# Patient Record
Sex: Female | Born: 1955
Health system: Southern US, Community
[De-identification: ages and names within clinical notes are randomized; demographics above are authoritative.]

## PROBLEM LIST (undated history)

## (undated) DIAGNOSIS — R519 Headache, unspecified: Secondary | ICD-10-CM

## (undated) DIAGNOSIS — R51 Headache: Secondary | ICD-10-CM

## (undated) DIAGNOSIS — K219 Gastro-esophageal reflux disease without esophagitis: Secondary | ICD-10-CM

## (undated) DIAGNOSIS — I10 Essential (primary) hypertension: Secondary | ICD-10-CM

## (undated) DIAGNOSIS — M1990 Unspecified osteoarthritis, unspecified site: Secondary | ICD-10-CM

## (undated) DIAGNOSIS — E039 Hypothyroidism, unspecified: Secondary | ICD-10-CM

## (undated) DIAGNOSIS — C189 Malignant neoplasm of colon, unspecified: Secondary | ICD-10-CM

## (undated) DIAGNOSIS — Z9221 Personal history of antineoplastic chemotherapy: Secondary | ICD-10-CM

## (undated) DIAGNOSIS — Z972 Presence of dental prosthetic device (complete) (partial): Secondary | ICD-10-CM

## (undated) DIAGNOSIS — C50919 Malignant neoplasm of unspecified site of unspecified female breast: Secondary | ICD-10-CM

## (undated) DIAGNOSIS — I82409 Acute embolism and thrombosis of unspecified deep veins of unspecified lower extremity: Secondary | ICD-10-CM

## (undated) DIAGNOSIS — C50912 Malignant neoplasm of unspecified site of left female breast: Secondary | ICD-10-CM

## (undated) HISTORY — PX: OTHER SURGICAL HISTORY: SHX169

## (undated) HISTORY — PX: ABDOMINAL HYSTERECTOMY: SHX81

---

## 2008-12-22 DIAGNOSIS — C50912 Malignant neoplasm of unspecified site of left female breast: Secondary | ICD-10-CM

## 2008-12-22 DIAGNOSIS — Z923 Personal history of irradiation: Secondary | ICD-10-CM

## 2008-12-22 HISTORY — PX: BREAST BIOPSY: SHX20

## 2008-12-22 HISTORY — DX: Personal history of irradiation: Z92.3

## 2008-12-22 HISTORY — DX: Malignant neoplasm of unspecified site of left female breast: C50.912

## 2008-12-22 HISTORY — PX: BREAST SURGERY: SHX581

## 2009-10-22 DIAGNOSIS — C50919 Malignant neoplasm of unspecified site of unspecified female breast: Secondary | ICD-10-CM

## 2009-10-22 HISTORY — DX: Malignant neoplasm of unspecified site of unspecified female breast: C50.919

## 2009-10-24 ENCOUNTER — Ambulatory Visit: Payer: Self-pay | Admitting: Family Medicine

## 2009-11-12 ENCOUNTER — Ambulatory Visit: Payer: Self-pay | Admitting: Surgery

## 2009-11-21 ENCOUNTER — Ambulatory Visit: Payer: Self-pay | Admitting: Oncology

## 2009-11-21 ENCOUNTER — Ambulatory Visit: Payer: Self-pay | Admitting: Surgery

## 2009-11-23 ENCOUNTER — Ambulatory Visit: Payer: Self-pay | Admitting: Surgery

## 2009-12-10 ENCOUNTER — Ambulatory Visit: Payer: Self-pay | Admitting: Oncology

## 2009-12-22 ENCOUNTER — Ambulatory Visit: Payer: Self-pay | Admitting: Oncology

## 2009-12-24 ENCOUNTER — Ambulatory Visit: Payer: Self-pay | Admitting: Oncology

## 2010-01-22 ENCOUNTER — Ambulatory Visit: Payer: Self-pay | Admitting: Oncology

## 2010-02-19 ENCOUNTER — Ambulatory Visit: Payer: Self-pay | Admitting: Oncology

## 2010-03-22 ENCOUNTER — Ambulatory Visit: Payer: Self-pay | Admitting: Oncology

## 2010-04-21 ENCOUNTER — Ambulatory Visit: Payer: Self-pay | Admitting: Oncology

## 2010-05-22 ENCOUNTER — Ambulatory Visit: Payer: Self-pay | Admitting: Oncology

## 2010-05-23 ENCOUNTER — Ambulatory Visit: Payer: Self-pay | Admitting: Oncology

## 2010-06-21 ENCOUNTER — Ambulatory Visit: Payer: Self-pay | Admitting: Oncology

## 2010-06-28 ENCOUNTER — Emergency Department: Payer: Self-pay | Admitting: Emergency Medicine

## 2010-08-22 ENCOUNTER — Ambulatory Visit: Payer: Self-pay | Admitting: Internal Medicine

## 2010-08-27 ENCOUNTER — Ambulatory Visit: Payer: Self-pay | Admitting: Oncology

## 2010-09-21 ENCOUNTER — Ambulatory Visit: Payer: Self-pay | Admitting: Internal Medicine

## 2010-11-12 ENCOUNTER — Ambulatory Visit: Payer: Self-pay | Admitting: Oncology

## 2011-03-10 ENCOUNTER — Ambulatory Visit: Payer: Self-pay | Admitting: Oncology

## 2011-03-23 ENCOUNTER — Ambulatory Visit: Payer: Self-pay | Admitting: Oncology

## 2011-09-10 ENCOUNTER — Ambulatory Visit: Payer: Self-pay | Admitting: Oncology

## 2011-09-22 ENCOUNTER — Ambulatory Visit: Payer: Self-pay | Admitting: Oncology

## 2011-10-23 ENCOUNTER — Ambulatory Visit: Payer: Self-pay | Admitting: Oncology

## 2011-12-23 DIAGNOSIS — C189 Malignant neoplasm of colon, unspecified: Secondary | ICD-10-CM

## 2011-12-23 HISTORY — DX: Malignant neoplasm of colon, unspecified: C18.9

## 2012-09-26 ENCOUNTER — Inpatient Hospital Stay: Payer: Self-pay | Admitting: Surgery

## 2012-09-26 LAB — CBC
HCT: 34.1 % — ABNORMAL LOW (ref 35.0–47.0)
HGB: 11.8 g/dL — ABNORMAL LOW (ref 12.0–16.0)
MCH: 30.9 pg (ref 26.0–34.0)
MCHC: 34.7 g/dL (ref 32.0–36.0)
Platelet: 227 10*3/uL (ref 150–440)
RBC: 3.83 10*6/uL (ref 3.80–5.20)

## 2012-09-26 LAB — COMPREHENSIVE METABOLIC PANEL
Albumin: 2.9 g/dL — ABNORMAL LOW (ref 3.4–5.0)
Alkaline Phosphatase: 71 U/L (ref 50–136)
BUN: 11 mg/dL (ref 7–18)
Bilirubin,Total: 0.3 mg/dL (ref 0.2–1.0)
Co2: 28 mmol/L (ref 21–32)
Creatinine: 0.51 mg/dL — ABNORMAL LOW (ref 0.60–1.30)
EGFR (Non-African Amer.): >60
Glucose: 111 mg/dL — ABNORMAL HIGH (ref 65–99)
Osmolality: 283 (ref 275–301)
SGPT (ALT): 11 U/L — ABNORMAL LOW (ref 12–78)
Sodium: 142 mmol/L (ref 136–145)
Total Protein: 6.9 g/dL (ref 6.4–8.2)

## 2012-09-26 LAB — URINALYSIS, COMPLETE
Glucose,UR: NEGATIVE mg/dL (ref 0–75)
Ketone: NEGATIVE
Leukocyte Esterase: NEGATIVE
Nitrite: NEGATIVE
RBC,UR: 28 /HPF (ref 0–5)
Specific Gravity: 1.015 (ref 1.003–1.030)
WBC UR: 4 /HPF (ref 0–5)

## 2012-09-27 LAB — CBC WITH DIFFERENTIAL/PLATELET
Basophil %: 0.6 %
Eosinophil %: 1.2 %
HCT: 29.6 % — ABNORMAL LOW (ref 35.0–47.0)
HGB: 10.3 g/dL — ABNORMAL LOW (ref 12.0–16.0)
Lymphocyte #: 0.9 10*3/uL — ABNORMAL LOW (ref 1.0–3.6)
MCH: 31 pg (ref 26.0–34.0)
MCHC: 34.7 g/dL (ref 32.0–36.0)
MCV: 89 fL (ref 80–100)
Monocyte #: 0.5 x10 3/mm (ref 0.2–0.9)
Neutrophil #: 3.5 10*3/uL (ref 1.4–6.5)
Neutrophil %: 70.4 %
RBC: 3.31 10*6/uL — ABNORMAL LOW (ref 3.80–5.20)

## 2012-09-27 LAB — COMPREHENSIVE METABOLIC PANEL
BUN: 6 mg/dL — ABNORMAL LOW (ref 7–18)
Bilirubin,Total: 0.4 mg/dL (ref 0.2–1.0)
Chloride: 108 mmol/L — ABNORMAL HIGH (ref 98–107)
Creatinine: 0.39 mg/dL — ABNORMAL LOW (ref 0.60–1.30)
EGFR (African American): >60
Glucose: 90 mg/dL (ref 65–99)
Osmolality: 286 (ref 275–301)
Potassium: 3.8 mmol/L (ref 3.5–5.1)
SGPT (ALT): 10 U/L — ABNORMAL LOW (ref 12–78)
Total Protein: 5.6 g/dL — ABNORMAL LOW (ref 6.4–8.2)

## 2012-09-27 LAB — TSH: Thyroid Stimulating Horm: <0.01 u[IU]/mL — ABNORMAL LOW

## 2012-09-28 LAB — CBC WITH DIFFERENTIAL/PLATELET
Basophil #: 0.1 10*3/uL (ref 0.0–0.1)
HCT: 28.3 % — ABNORMAL LOW (ref 35.0–47.0)
HGB: 9.8 g/dL — ABNORMAL LOW (ref 12.0–16.0)
Lymphocyte #: 1.2 10*3/uL (ref 1.0–3.6)
Lymphocyte %: 21.3 %
MCH: 30.7 pg (ref 26.0–34.0)
MCV: 89 fL (ref 80–100)
Monocyte #: 0.5 x10 3/mm (ref 0.2–0.9)
Monocyte %: 9.2 %
Neutrophil #: 3.9 10*3/uL (ref 1.4–6.5)
RBC: 3.18 10*6/uL — ABNORMAL LOW (ref 3.80–5.20)
RDW: 12 % (ref 11.5–14.5)
WBC: 5.7 10*3/uL (ref 3.6–11.0)

## 2012-09-28 LAB — URINE CULTURE

## 2012-10-03 LAB — CULTURE, BLOOD (SINGLE)

## 2012-11-21 HISTORY — PX: COLON SURGERY: SHX602

## 2012-11-21 HISTORY — PX: APPENDECTOMY: SHX54

## 2012-12-01 ENCOUNTER — Ambulatory Visit: Payer: Self-pay | Admitting: Surgery

## 2012-12-01 LAB — CBC WITH DIFFERENTIAL/PLATELET
Basophil #: 0 10*3/uL (ref 0.0–0.1)
Eosinophil #: 0.1 10*3/uL (ref 0.0–0.7)
Eosinophil %: 1.8 %
Lymphocyte #: 1.7 10*3/uL (ref 1.0–3.6)
Lymphocyte %: 37.2 %
MCHC: 33.8 g/dL (ref 32.0–36.0)
MCV: 89 fL (ref 80–100)
Monocyte #: 0.3 x10 3/mm (ref 0.2–0.9)
Monocyte %: 6.2 %
Neutrophil %: 54 %
Platelet: 209 10*3/uL (ref 150–440)
RBC: 3.84 10*6/uL (ref 3.80–5.20)
RDW: 15.3 % — ABNORMAL HIGH (ref 11.5–14.5)
WBC: 4.4 10*3/uL (ref 3.6–11.0)

## 2012-12-01 LAB — HEPATIC FUNCTION PANEL A (ARMC)
Albumin: 3.7 g/dL (ref 3.4–5.0)
Bilirubin, Direct: <0.1 mg/dL (ref 0.00–0.20)
Bilirubin,Total: 0.3 mg/dL (ref 0.2–1.0)
SGPT (ALT): 16 U/L (ref 12–78)
Total Protein: 6.7 g/dL (ref 6.4–8.2)

## 2012-12-01 LAB — BASIC METABOLIC PANEL
Anion Gap: 5 — ABNORMAL LOW (ref 7–16)
Calcium, Total: 8.6 mg/dL (ref 8.5–10.1)
Co2: 29 mmol/L (ref 21–32)
Creatinine: 0.37 mg/dL — ABNORMAL LOW (ref 0.60–1.30)
EGFR (Non-African Amer.): >60
Potassium: 3.8 mmol/L (ref 3.5–5.1)
Sodium: 142 mmol/L (ref 136–145)

## 2012-12-08 ENCOUNTER — Inpatient Hospital Stay: Payer: Self-pay | Admitting: Surgery

## 2012-12-09 LAB — BASIC METABOLIC PANEL
Anion Gap: 5 — ABNORMAL LOW (ref 7–16)
BUN: 7 mg/dL (ref 7–18)
Calcium, Total: 8.8 mg/dL (ref 8.5–10.1)
Co2: 28 mmol/L (ref 21–32)
EGFR (African American): >60
EGFR (Non-African Amer.): >60
Glucose: 146 mg/dL — ABNORMAL HIGH (ref 65–99)
Osmolality: 276 (ref 275–301)
Potassium: 3.5 mmol/L (ref 3.5–5.1)

## 2012-12-09 LAB — CBC WITH DIFFERENTIAL/PLATELET
Basophil %: 0.4 %
Eosinophil #: 0 10*3/uL (ref 0.0–0.7)
HCT: 36.9 % (ref 35.0–47.0)
MCH: 29.1 pg (ref 26.0–34.0)
MCHC: 32.4 g/dL (ref 32.0–36.0)
MCV: 90 fL (ref 80–100)
Monocyte %: 6 %
Neutrophil #: 6.8 10*3/uL — ABNORMAL HIGH (ref 1.4–6.5)
Platelet: 209 10*3/uL (ref 150–440)
RDW: 15.5 % — ABNORMAL HIGH (ref 11.5–14.5)

## 2012-12-11 LAB — CBC WITH DIFFERENTIAL/PLATELET
Basophil #: 0 10*3/uL (ref 0.0–0.1)
Basophil %: 0.8 %
Eosinophil %: 2.2 %
HGB: 11.2 g/dL — ABNORMAL LOW (ref 12.0–16.0)
Lymphocyte #: 1.2 10*3/uL (ref 1.0–3.6)
Lymphocyte %: 30.6 %
MCH: 30 pg (ref 26.0–34.0)
MCHC: 34 g/dL (ref 32.0–36.0)
MCV: 88 fL (ref 80–100)
Monocyte #: 0.4 x10 3/mm (ref 0.2–0.9)
Neutrophil %: 56.6 %
RBC: 3.74 10*6/uL — ABNORMAL LOW (ref 3.80–5.20)
RDW: 14.9 % — ABNORMAL HIGH (ref 11.5–14.5)
WBC: 3.8 10*3/uL (ref 3.6–11.0)

## 2012-12-11 LAB — BASIC METABOLIC PANEL
Anion Gap: 6 — ABNORMAL LOW (ref 7–16)
Calcium, Total: 8.5 mg/dL (ref 8.5–10.1)
Chloride: 105 mmol/L (ref 98–107)
Co2: 28 mmol/L (ref 21–32)
EGFR (African American): >60
Sodium: 139 mmol/L (ref 136–145)

## 2012-12-13 LAB — CBC WITH DIFFERENTIAL/PLATELET
Basophil #: 0 10*3/uL (ref 0.0–0.1)
HCT: 33.8 % — ABNORMAL LOW (ref 35.0–47.0)
HGB: 11.6 g/dL — ABNORMAL LOW (ref 12.0–16.0)
Lymphocyte %: 18.3 %
MCH: 30 pg (ref 26.0–34.0)
MCV: 88 fL (ref 80–100)
Monocyte %: 8.7 %
Neutrophil #: 3.4 10*3/uL (ref 1.4–6.5)
RDW: 14.8 % — ABNORMAL HIGH (ref 11.5–14.5)
WBC: 4.7 10*3/uL (ref 3.6–11.0)

## 2012-12-13 LAB — COMPREHENSIVE METABOLIC PANEL
Albumin: 2.1 g/dL — ABNORMAL LOW (ref 3.4–5.0)
Alkaline Phosphatase: 59 U/L (ref 50–136)
Calcium, Total: 7.9 mg/dL — ABNORMAL LOW (ref 8.5–10.1)
Co2: 29 mmol/L (ref 21–32)
Creatinine: 0.55 mg/dL — ABNORMAL LOW (ref 0.60–1.30)
EGFR (African American): >60
EGFR (Non-African Amer.): >60
Glucose: 136 mg/dL — ABNORMAL HIGH (ref 65–99)
SGOT(AST): 9 U/L — ABNORMAL LOW (ref 15–37)
SGPT (ALT): 10 U/L — ABNORMAL LOW (ref 12–78)

## 2012-12-13 LAB — PATHOLOGY REPORT

## 2012-12-14 LAB — CEA: CEA: 7.8 ng/mL — ABNORMAL HIGH (ref 0.0–4.7)

## 2012-12-15 LAB — COMPREHENSIVE METABOLIC PANEL
Albumin: 2 g/dL — ABNORMAL LOW (ref 3.4–5.0)
Anion Gap: 6 — ABNORMAL LOW (ref 7–16)
BUN: 7 mg/dL (ref 7–18)
Bilirubin,Total: 0.3 mg/dL (ref 0.2–1.0)
Creatinine: 0.45 mg/dL — ABNORMAL LOW (ref 0.60–1.30)
EGFR (African American): >60
Glucose: 100 mg/dL — ABNORMAL HIGH (ref 65–99)
Total Protein: 5.4 g/dL — ABNORMAL LOW (ref 6.4–8.2)

## 2012-12-15 LAB — CBC WITH DIFFERENTIAL/PLATELET
Basophil #: 0 10*3/uL (ref 0.0–0.1)
Eosinophil #: 0 10*3/uL (ref 0.0–0.7)
HCT: 28.9 % — ABNORMAL LOW (ref 35.0–47.0)
HGB: 9.7 g/dL — ABNORMAL LOW (ref 12.0–16.0)
MCH: 29.6 pg (ref 26.0–34.0)
MCHC: 33.6 g/dL (ref 32.0–36.0)
Monocyte #: 0.5 x10 3/mm (ref 0.2–0.9)
Neutrophil %: 56 %
Platelet: 259 10*3/uL (ref 150–440)
RBC: 3.28 10*6/uL — ABNORMAL LOW (ref 3.80–5.20)
RDW: 14.6 % — ABNORMAL HIGH (ref 11.5–14.5)
WBC: 2.9 10*3/uL — ABNORMAL LOW (ref 3.6–11.0)

## 2012-12-17 LAB — COMPREHENSIVE METABOLIC PANEL
Albumin: 2.4 g/dL — ABNORMAL LOW (ref 3.4–5.0)
Alkaline Phosphatase: 76 U/L (ref 50–136)
Anion Gap: 8 (ref 7–16)
BUN: 9 mg/dL (ref 7–18)
Bilirubin,Total: 0.3 mg/dL (ref 0.2–1.0)
Chloride: 100 mmol/L (ref 98–107)
Creatinine: 0.47 mg/dL — ABNORMAL LOW (ref 0.60–1.30)
EGFR (African American): >60
Glucose: 91 mg/dL (ref 65–99)
Potassium: 3.7 mmol/L (ref 3.5–5.1)
SGOT(AST): 20 U/L (ref 15–37)
SGPT (ALT): 13 U/L (ref 12–78)
Total Protein: 6.3 g/dL — ABNORMAL LOW (ref 6.4–8.2)

## 2012-12-17 LAB — CBC WITH DIFFERENTIAL/PLATELET
Basophil #: 0 10*3/uL (ref 0.0–0.1)
Eosinophil #: 0.1 10*3/uL (ref 0.0–0.7)
HCT: 32.9 % — ABNORMAL LOW (ref 35.0–47.0)
HGB: 11.1 g/dL — ABNORMAL LOW (ref 12.0–16.0)
Lymphocyte #: 1.3 10*3/uL (ref 1.0–3.6)
MCH: 29.5 pg (ref 26.0–34.0)
MCHC: 33.6 g/dL (ref 32.0–36.0)
MCV: 88 fL (ref 80–100)
Monocyte %: 9.1 %
Neutrophil #: 4.6 10*3/uL (ref 1.4–6.5)
Neutrophil %: 69.1 %
Platelet: 418 10*3/uL (ref 150–440)
RBC: 3.75 10*6/uL — ABNORMAL LOW (ref 3.80–5.20)
RDW: 14.3 % (ref 11.5–14.5)
WBC: 6.6 10*3/uL (ref 3.6–11.0)

## 2012-12-22 ENCOUNTER — Ambulatory Visit: Payer: Self-pay | Admitting: Oncology

## 2012-12-31 ENCOUNTER — Ambulatory Visit: Payer: Self-pay | Admitting: Oncology

## 2013-01-05 ENCOUNTER — Ambulatory Visit: Payer: Self-pay | Admitting: Oncology

## 2013-01-14 ENCOUNTER — Other Ambulatory Visit: Payer: Self-pay | Admitting: Surgery

## 2013-01-14 LAB — CBC WITH DIFFERENTIAL/PLATELET
Basophil %: 1.3 %
HCT: 35.3 % (ref 35.0–47.0)
Lymphocyte #: 1.9 10*3/uL (ref 1.0–3.6)
Lymphocyte %: 30.4 %
MCH: 28.3 pg (ref 26.0–34.0)
Monocyte %: 5.1 %
Neutrophil #: 4 10*3/uL (ref 1.4–6.5)
RBC: 4.06 10*6/uL (ref 3.80–5.20)
RDW: 13.8 % (ref 11.5–14.5)
WBC: 6.4 10*3/uL (ref 3.6–11.0)

## 2013-01-14 LAB — BASIC METABOLIC PANEL
BUN: 11 mg/dL (ref 7–18)
Calcium, Total: 9 mg/dL (ref 8.5–10.1)
EGFR (African American): >60
Glucose: 83 mg/dL (ref 65–99)
Osmolality: 280 (ref 275–301)
Potassium: 3.9 mmol/L (ref 3.5–5.1)
Sodium: 141 mmol/L (ref 136–145)

## 2013-01-19 ENCOUNTER — Ambulatory Visit: Payer: Self-pay | Admitting: Surgery

## 2013-01-20 LAB — COMPREHENSIVE METABOLIC PANEL
Alkaline Phosphatase: 78 U/L (ref 50–136)
Anion Gap: 9 (ref 7–16)
BUN: 14 mg/dL (ref 7–18)
Bilirubin,Total: 0.3 mg/dL (ref 0.2–1.0)
Chloride: 101 mmol/L (ref 98–107)
Creatinine: 0.61 mg/dL (ref 0.60–1.30)
EGFR (African American): >60
EGFR (Non-African Amer.): >60
Osmolality: 280 (ref 275–301)
Potassium: 3.7 mmol/L (ref 3.5–5.1)
SGOT(AST): 15 U/L (ref 15–37)
Sodium: 140 mmol/L (ref 136–145)
Total Protein: 6.9 g/dL (ref 6.4–8.2)

## 2013-01-20 LAB — CBC CANCER CENTER
Basophil %: 0.9 %
Eosinophil #: 0.1 x10 3/mm (ref 0.0–0.7)
HCT: 34.9 % — ABNORMAL LOW (ref 35.0–47.0)
Lymphocyte #: 1.9 x10 3/mm (ref 1.0–3.6)
MCH: 29.1 pg (ref 26.0–34.0)
MCHC: 33.4 g/dL (ref 32.0–36.0)
MCV: 87 fL (ref 80–100)
Monocyte #: 0.3 x10 3/mm (ref 0.2–0.9)
Monocyte %: 6.5 %
Neutrophil %: 50.1 %
RBC: 4 10*6/uL (ref 3.80–5.20)
RDW: 14.8 % — ABNORMAL HIGH (ref 11.5–14.5)
WBC: 4.6 x10 3/mm (ref 3.6–11.0)

## 2013-01-21 LAB — CEA: CEA: 0.8 ng/mL (ref 0.0–4.7)

## 2013-01-22 ENCOUNTER — Ambulatory Visit: Payer: Self-pay | Admitting: Oncology

## 2013-01-27 LAB — CBC CANCER CENTER
Basophil %: 0.6 %
Eosinophil #: 0.2 x10 3/mm (ref 0.0–0.7)
Eosinophil %: 5.5 %
HCT: 37.1 % (ref 35.0–47.0)
HGB: 12.6 g/dL (ref 12.0–16.0)
Lymphocyte #: 1.9 x10 3/mm (ref 1.0–3.6)
Lymphocyte %: 43.7 %
MCH: 29.3 pg (ref 26.0–34.0)
MCHC: 33.9 g/dL (ref 32.0–36.0)
Monocyte %: 6.3 %
Neutrophil #: 1.9 x10 3/mm (ref 1.4–6.5)
Neutrophil %: 43.9 %
RBC: 4.3 10*6/uL (ref 3.80–5.20)
RDW: 13.7 % (ref 11.5–14.5)
WBC: 4.2 x10 3/mm (ref 3.6–11.0)

## 2013-01-27 LAB — COMPREHENSIVE METABOLIC PANEL
Alkaline Phosphatase: 75 U/L (ref 50–136)
Chloride: 100 mmol/L (ref 98–107)
Co2: 31 mmol/L (ref 21–32)
Creatinine: 0.66 mg/dL (ref 0.60–1.30)
EGFR (African American): >60
EGFR (Non-African Amer.): >60
Glucose: 143 mg/dL — ABNORMAL HIGH (ref 65–99)
Osmolality: 283 (ref 275–301)
SGPT (ALT): 31 U/L (ref 12–78)
Sodium: 141 mmol/L (ref 136–145)
Total Protein: 7.6 g/dL (ref 6.4–8.2)

## 2013-01-27 LAB — MAGNESIUM: Magnesium: 1.1 mg/dL — ABNORMAL LOW

## 2013-02-11 LAB — CBC CANCER CENTER
Basophil #: 0 x10 3/mm (ref 0.0–0.1)
Eosinophil #: 0.1 x10 3/mm (ref 0.0–0.7)
Eosinophil %: 2.6 %
HCT: 35 % (ref 35.0–47.0)
HGB: 11.8 g/dL — ABNORMAL LOW (ref 12.0–16.0)
MCH: 29.7 pg (ref 26.0–34.0)
MCHC: 33.8 g/dL (ref 32.0–36.0)
Monocyte #: 0.4 x10 3/mm (ref 0.2–0.9)
Neutrophil #: 1.5 x10 3/mm (ref 1.4–6.5)
Neutrophil %: 44.5 %
Platelet: 189 x10 3/mm (ref 150–440)
RBC: 3.99 10*6/uL (ref 3.80–5.20)
RDW: 16 % — ABNORMAL HIGH (ref 11.5–14.5)
WBC: 3.4 x10 3/mm — ABNORMAL LOW (ref 3.6–11.0)

## 2013-02-11 LAB — COMPREHENSIVE METABOLIC PANEL
Albumin: 3.4 g/dL (ref 3.4–5.0)
Alkaline Phosphatase: 82 U/L (ref 50–136)
BUN: 6 mg/dL — ABNORMAL LOW (ref 7–18)
Bilirubin,Total: 0.3 mg/dL (ref 0.2–1.0)
Calcium, Total: 9 mg/dL (ref 8.5–10.1)
Chloride: 105 mmol/L (ref 98–107)
Co2: 27 mmol/L (ref 21–32)
Creatinine: 0.7 mg/dL (ref 0.60–1.30)
EGFR (African American): >60
Potassium: 4 mmol/L (ref 3.5–5.1)
SGOT(AST): 19 U/L (ref 15–37)
Sodium: 141 mmol/L (ref 136–145)
Total Protein: 6.8 g/dL (ref 6.4–8.2)

## 2013-02-19 ENCOUNTER — Ambulatory Visit: Payer: Self-pay | Admitting: Oncology

## 2013-02-25 LAB — CBC CANCER CENTER
Basophil #: 0 x10 3/mm (ref 0.0–0.1)
Eosinophil %: 2.8 %
HCT: 34.3 % — ABNORMAL LOW (ref 35.0–47.0)
HGB: 11.4 g/dL — ABNORMAL LOW (ref 12.0–16.0)
Lymphocyte %: 41.9 %
MCHC: 33.1 g/dL (ref 32.0–36.0)
Monocyte #: 0.2 x10 3/mm (ref 0.2–0.9)
Monocyte %: 5.9 %
Neutrophil #: 1.5 x10 3/mm (ref 1.4–6.5)
Neutrophil %: 48.5 %
Platelet: 127 x10 3/mm — ABNORMAL LOW (ref 150–440)
RBC: 3.9 10*6/uL (ref 3.80–5.20)
RDW: 16.3 % — ABNORMAL HIGH (ref 11.5–14.5)
WBC: 3.1 x10 3/mm — ABNORMAL LOW (ref 3.6–11.0)

## 2013-02-25 LAB — COMPREHENSIVE METABOLIC PANEL
Albumin: 3.3 g/dL — ABNORMAL LOW (ref 3.4–5.0)
Bilirubin,Total: 0.2 mg/dL (ref 0.2–1.0)
Calcium, Total: 8.5 mg/dL (ref 8.5–10.1)
Co2: 31 mmol/L (ref 21–32)
Creatinine: 0.57 mg/dL — ABNORMAL LOW (ref 0.60–1.30)
EGFR (Non-African Amer.): >60
Glucose: 92 mg/dL (ref 65–99)
Potassium: 3.7 mmol/L (ref 3.5–5.1)
SGOT(AST): 26 U/L (ref 15–37)
SGPT (ALT): 28 U/L (ref 12–78)
Sodium: 144 mmol/L (ref 136–145)

## 2013-02-25 LAB — MAGNESIUM: Magnesium: 1.8 mg/dL

## 2013-03-11 LAB — CBC CANCER CENTER
Eosinophil %: 1.2 %
HGB: 11.7 g/dL — ABNORMAL LOW (ref 12.0–16.0)
Lymphocyte #: 1.3 x10 3/mm (ref 1.0–3.6)
Lymphocyte %: 31.8 %
MCH: 29.8 pg (ref 26.0–34.0)
MCHC: 34 g/dL (ref 32.0–36.0)
MCV: 88 fL (ref 80–100)
Monocyte #: 0.4 x10 3/mm (ref 0.2–0.9)
Neutrophil #: 2.3 x10 3/mm (ref 1.4–6.5)
Neutrophil %: 56.2 %
Platelet: 95 x10 3/mm — ABNORMAL LOW (ref 150–440)
RDW: 17.6 % — ABNORMAL HIGH (ref 11.5–14.5)
WBC: 4 x10 3/mm (ref 3.6–11.0)

## 2013-03-11 LAB — COMPREHENSIVE METABOLIC PANEL
Alkaline Phosphatase: 69 U/L (ref 50–136)
Anion Gap: 9 (ref 7–16)
BUN: 13 mg/dL (ref 7–18)
Bilirubin,Total: 0.3 mg/dL (ref 0.2–1.0)
Calcium, Total: 8.6 mg/dL (ref 8.5–10.1)
Chloride: 105 mmol/L (ref 98–107)
Co2: 28 mmol/L (ref 21–32)
Creatinine: 0.62 mg/dL (ref 0.60–1.30)
EGFR (African American): >60
Glucose: 99 mg/dL (ref 65–99)
Osmolality: 283 (ref 275–301)
SGOT(AST): 30 U/L (ref 15–37)
SGPT (ALT): 34 U/L (ref 12–78)
Sodium: 142 mmol/L (ref 136–145)

## 2013-03-14 LAB — COMPREHENSIVE METABOLIC PANEL
Albumin: 3.6 g/dL (ref 3.4–5.0)
Alkaline Phosphatase: 74 U/L (ref 50–136)
Anion Gap: 6 — ABNORMAL LOW (ref 7–16)
BUN: 17 mg/dL (ref 7–18)
Bilirubin,Total: 0.6 mg/dL (ref 0.2–1.0)
Calcium, Total: 9.2 mg/dL (ref 8.5–10.1)
Chloride: 104 mmol/L (ref 98–107)
Creatinine: 0.74 mg/dL (ref 0.60–1.30)
EGFR (African American): >60
EGFR (Non-African Amer.): >60
Glucose: 98 mg/dL (ref 65–99)
Osmolality: 286 (ref 275–301)
Potassium: 3 mmol/L — ABNORMAL LOW (ref 3.5–5.1)
SGPT (ALT): 52 U/L (ref 12–78)
Sodium: 143 mmol/L (ref 136–145)

## 2013-03-14 LAB — CBC CANCER CENTER
Basophil #: 0 x10 3/mm (ref 0.0–0.1)
Eosinophil #: 0 x10 3/mm (ref 0.0–0.7)
Eosinophil %: 0 %
HCT: 37.8 % (ref 35.0–47.0)
MCH: 30 pg (ref 26.0–34.0)
Monocyte #: 0.1 x10 3/mm — ABNORMAL LOW (ref 0.2–0.9)
Monocyte %: 3.4 %
RBC: 4.27 10*6/uL (ref 3.80–5.20)
RDW: 18.4 % — ABNORMAL HIGH (ref 11.5–14.5)

## 2013-03-22 ENCOUNTER — Ambulatory Visit: Payer: Self-pay | Admitting: Oncology

## 2013-03-25 LAB — CBC CANCER CENTER
Basophil #: 0 x10 3/mm (ref 0.0–0.1)
MCV: 90 fL (ref 80–100)
Monocyte #: 0.3 x10 3/mm (ref 0.2–0.9)
Neutrophil #: 1.2 x10 3/mm — ABNORMAL LOW (ref 1.4–6.5)
RBC: 3.5 10*6/uL — ABNORMAL LOW (ref 3.80–5.20)

## 2013-03-25 LAB — COMPREHENSIVE METABOLIC PANEL
Albumin: 3.3 g/dL — ABNORMAL LOW (ref 3.4–5.0)
Alkaline Phosphatase: 79 U/L (ref 50–136)
BUN: 9 mg/dL (ref 7–18)
Bilirubin,Total: 0.3 mg/dL (ref 0.2–1.0)
Chloride: 106 mmol/L (ref 98–107)
Co2: 29 mmol/L (ref 21–32)
Creatinine: 0.6 mg/dL (ref 0.60–1.30)
EGFR (Non-African Amer.): >60
Glucose: 91 mg/dL (ref 65–99)
Osmolality: 281 (ref 275–301)
Potassium: 3.2 mmol/L — ABNORMAL LOW (ref 3.5–5.1)
Sodium: 142 mmol/L (ref 136–145)

## 2013-04-01 LAB — CBC CANCER CENTER
Basophil #: 0 x10 3/mm (ref 0.0–0.1)
Eosinophil #: 0.1 x10 3/mm (ref 0.0–0.7)
HCT: 34.2 % — ABNORMAL LOW (ref 35.0–47.0)
Lymphocyte #: 1.6 x10 3/mm (ref 1.0–3.6)
Lymphocyte %: 55.5 %
MCH: 29.8 pg (ref 26.0–34.0)
Monocyte %: 12.1 %
Platelet: 74 x10 3/mm — ABNORMAL LOW (ref 150–440)
RBC: 3.82 10*6/uL (ref 3.80–5.20)
RDW: 19.4 % — ABNORMAL HIGH (ref 11.5–14.5)
WBC: 2.8 x10 3/mm — ABNORMAL LOW (ref 3.6–11.0)

## 2013-04-15 LAB — CBC CANCER CENTER
Basophil %: 1.1 %
Eosinophil #: 0 x10 3/mm (ref 0.0–0.7)
HCT: 33 % — ABNORMAL LOW (ref 35.0–47.0)
HGB: 10.9 g/dL — ABNORMAL LOW (ref 12.0–16.0)
Lymphocyte %: 53.1 %
MCH: 30.9 pg (ref 26.0–34.0)
MCHC: 33.1 g/dL (ref 32.0–36.0)
MCV: 93 fL (ref 80–100)
Monocyte %: 18.3 %
Platelet: 125 x10 3/mm — ABNORMAL LOW (ref 150–440)
RBC: 3.54 10*6/uL — ABNORMAL LOW (ref 3.80–5.20)
RDW: 20.2 % — ABNORMAL HIGH (ref 11.5–14.5)
WBC: 2.6 x10 3/mm — ABNORMAL LOW (ref 3.6–11.0)

## 2013-04-15 LAB — COMPREHENSIVE METABOLIC PANEL
Alkaline Phosphatase: 99 U/L (ref 50–136)
Anion Gap: 8 (ref 7–16)
BUN: 11 mg/dL (ref 7–18)
Bilirubin,Total: 0.3 mg/dL (ref 0.2–1.0)
Calcium, Total: 8.9 mg/dL (ref 8.5–10.1)
Chloride: 105 mmol/L (ref 98–107)
Co2: 29 mmol/L (ref 21–32)
Creatinine: 0.79 mg/dL (ref 0.60–1.30)
EGFR (Non-African Amer.): >60
Glucose: 122 mg/dL — ABNORMAL HIGH (ref 65–99)
Potassium: 3.6 mmol/L (ref 3.5–5.1)
SGOT(AST): 36 U/L (ref 15–37)
Sodium: 142 mmol/L (ref 136–145)

## 2013-04-18 LAB — CBC CANCER CENTER
Basophil #: 0.1 x10 3/mm (ref 0.0–0.1)
Basophil %: 1.3 %
Eosinophil #: 0.1 x10 3/mm (ref 0.0–0.7)
Eosinophil %: 1.5 %
Lymphocyte %: 40.1 %
Monocyte %: 10.7 %
Neutrophil #: 1.9 x10 3/mm (ref 1.4–6.5)
Neutrophil %: 46.4 %
RBC: 3.74 10*6/uL — ABNORMAL LOW (ref 3.80–5.20)
RDW: 18.5 % — ABNORMAL HIGH (ref 11.5–14.5)

## 2013-04-18 LAB — COMPREHENSIVE METABOLIC PANEL
Alkaline Phosphatase: 116 U/L (ref 50–136)
Anion Gap: 7 (ref 7–16)
BUN: 10 mg/dL (ref 7–18)
Bilirubin,Total: 0.2 mg/dL (ref 0.2–1.0)
Calcium, Total: 8.9 mg/dL (ref 8.5–10.1)
Creatinine: 0.58 mg/dL — ABNORMAL LOW (ref 0.60–1.30)
EGFR (African American): >60
Osmolality: 284 (ref 275–301)
Potassium: 3.6 mmol/L (ref 3.5–5.1)
Sodium: 142 mmol/L (ref 136–145)

## 2013-04-21 ENCOUNTER — Ambulatory Visit: Payer: Self-pay | Admitting: Oncology

## 2013-04-26 LAB — CBC CANCER CENTER
Basophil #: 0 "x10 3/mm "
Basophil %: 0.7 %
Eosinophil #: 0.1 "x10 3/mm "
Eosinophil %: 1.1 %
HCT: 35.6 %
HGB: 11.9 g/dL — ABNORMAL LOW
Lymphocyte %: 23.7 %
Lymphs Abs: 1.5 "x10 3/mm "
MCH: 31.3 pg
MCHC: 33.6 g/dL
MCV: 93 fL
Monocyte #: 0.8 "x10 3/mm "
Monocyte %: 12.9 %
Neutrophil #: 3.9 "x10 3/mm "
Neutrophil %: 61.6 %
Platelet: 58 "x10 3/mm " — ABNORMAL LOW
RBC: 3.82 "x10 6/mm "
RDW: 16.3 % — ABNORMAL HIGH
WBC: 6.3 "x10 3/mm "

## 2013-05-06 LAB — COMPREHENSIVE METABOLIC PANEL
Alkaline Phosphatase: 132 U/L (ref 50–136)
BUN: 10 mg/dL (ref 7–18)
Co2: 29 mmol/L (ref 21–32)
Creatinine: 0.55 mg/dL — ABNORMAL LOW (ref 0.60–1.30)
EGFR (African American): >60
EGFR (Non-African Amer.): >60
Osmolality: 284 (ref 275–301)
SGOT(AST): 41 U/L — ABNORMAL HIGH (ref 15–37)
SGPT (ALT): 38 U/L (ref 12–78)
Sodium: 142 mmol/L (ref 136–145)
Total Protein: 6.8 g/dL (ref 6.4–8.2)

## 2013-05-06 LAB — CBC CANCER CENTER
Basophil #: 0 x10 3/mm (ref 0.0–0.1)
Eosinophil #: 0.1 x10 3/mm (ref 0.0–0.7)
Eosinophil %: 1.3 %
HCT: 34.3 % — ABNORMAL LOW (ref 35.0–47.0)
HGB: 11.6 g/dL — ABNORMAL LOW (ref 12.0–16.0)
Lymphocyte #: 1.5 x10 3/mm (ref 1.0–3.6)
Lymphocyte %: 30.9 %
MCH: 31.7 pg (ref 26.0–34.0)
MCHC: 34 g/dL (ref 32.0–36.0)
MCV: 93 fL (ref 80–100)
Monocyte #: 0.4 x10 3/mm (ref 0.2–0.9)
Neutrophil %: 57.7 %
RBC: 3.67 10*6/uL — ABNORMAL LOW (ref 3.80–5.20)
RDW: 16.7 % — ABNORMAL HIGH (ref 11.5–14.5)
WBC: 4.8 x10 3/mm (ref 3.6–11.0)

## 2013-05-20 LAB — COMPREHENSIVE METABOLIC PANEL
Albumin: 3.4 g/dL (ref 3.4–5.0)
Alkaline Phosphatase: 150 U/L — ABNORMAL HIGH (ref 50–136)
Anion Gap: 7 (ref 7–16)
BUN: 12 mg/dL (ref 7–18)
Chloride: 104 mmol/L (ref 98–107)
Co2: 29 mmol/L (ref 21–32)
Creatinine: 0.58 mg/dL — ABNORMAL LOW (ref 0.60–1.30)
Glucose: 104 mg/dL — ABNORMAL HIGH (ref 65–99)
SGOT(AST): 44 U/L — ABNORMAL HIGH (ref 15–37)
SGPT (ALT): 45 U/L (ref 12–78)
Sodium: 140 mmol/L (ref 136–145)
Total Protein: 6.5 g/dL (ref 6.4–8.2)

## 2013-05-20 LAB — CBC CANCER CENTER
Basophil #: 0 x10 3/mm (ref 0.0–0.1)
Basophil %: 0.6 %
Eosinophil #: 0.1 x10 3/mm (ref 0.0–0.7)
Eosinophil %: 1.3 %
HCT: 32.6 % — ABNORMAL LOW (ref 35.0–47.0)
Lymphocyte #: 1.7 x10 3/mm (ref 1.0–3.6)
MCH: 31.7 pg (ref 26.0–34.0)
MCHC: 34 g/dL (ref 32.0–36.0)
MCV: 93 fL (ref 80–100)
Neutrophil #: 5 x10 3/mm (ref 1.4–6.5)
Neutrophil %: 68.1 %
Platelet: 106 x10 3/mm — ABNORMAL LOW (ref 150–440)

## 2013-05-22 ENCOUNTER — Ambulatory Visit: Payer: Self-pay | Admitting: Oncology

## 2013-06-03 LAB — COMPREHENSIVE METABOLIC PANEL
Albumin: 3.4 g/dL (ref 3.4–5.0)
Alkaline Phosphatase: 148 U/L — ABNORMAL HIGH (ref 50–136)
Anion Gap: 9 (ref 7–16)
BUN: 6 mg/dL — ABNORMAL LOW (ref 7–18)
Co2: 28 mmol/L (ref 21–32)
Creatinine: 0.71 mg/dL (ref 0.60–1.30)
EGFR (African American): >60
Glucose: 143 mg/dL — ABNORMAL HIGH (ref 65–99)
Potassium: 2.8 mmol/L — ABNORMAL LOW (ref 3.5–5.1)
SGOT(AST): 40 U/L — ABNORMAL HIGH (ref 15–37)
SGPT (ALT): 38 U/L (ref 12–78)
Sodium: 143 mmol/L (ref 136–145)
Total Protein: 6.4 g/dL (ref 6.4–8.2)

## 2013-06-03 LAB — CBC CANCER CENTER
Eosinophil %: 2.1 %
HCT: 32.4 % — ABNORMAL LOW (ref 35.0–47.0)
HGB: 10.9 g/dL — ABNORMAL LOW (ref 12.0–16.0)
Lymphocyte #: 1.8 x10 3/mm (ref 1.0–3.6)
MCH: 31.6 pg (ref 26.0–34.0)
MCV: 94 fL (ref 80–100)
Monocyte #: 0.5 x10 3/mm (ref 0.2–0.9)
Monocyte %: 9.4 %
Neutrophil #: 3.3 x10 3/mm (ref 1.4–6.5)
Platelet: 102 x10 3/mm — ABNORMAL LOW (ref 150–440)
RBC: 3.45 10*6/uL — ABNORMAL LOW (ref 3.80–5.20)
WBC: 5.8 x10 3/mm (ref 3.6–11.0)

## 2013-06-17 LAB — COMPREHENSIVE METABOLIC PANEL
Alkaline Phosphatase: 172 U/L — ABNORMAL HIGH (ref 50–136)
Anion Gap: 7 (ref 7–16)
BUN: 7 mg/dL (ref 7–18)
Bilirubin,Total: 0.4 mg/dL (ref 0.2–1.0)
Calcium, Total: 9.2 mg/dL (ref 8.5–10.1)
Co2: 29 mmol/L (ref 21–32)
Creatinine: 0.59 mg/dL — ABNORMAL LOW (ref 0.60–1.30)
EGFR (African American): >60
Glucose: 105 mg/dL — ABNORMAL HIGH (ref 65–99)
Osmolality: 280 (ref 275–301)
Potassium: 3.3 mmol/L — ABNORMAL LOW (ref 3.5–5.1)
Sodium: 141 mmol/L (ref 136–145)

## 2013-06-17 LAB — CBC CANCER CENTER
Eosinophil #: 0.1 x10 3/mm (ref 0.0–0.7)
HCT: 34 % — ABNORMAL LOW (ref 35.0–47.0)
Lymphocyte #: 1.5 x10 3/mm (ref 1.0–3.6)
MCH: 33.2 pg (ref 26.0–34.0)
MCV: 96 fL (ref 80–100)
Monocyte #: 0.5 x10 3/mm (ref 0.2–0.9)
Neutrophil #: 5 x10 3/mm (ref 1.4–6.5)
Neutrophil %: 70 %
RBC: 3.55 10*6/uL — ABNORMAL LOW (ref 3.80–5.20)
RDW: 16.6 % — ABNORMAL HIGH (ref 11.5–14.5)
WBC: 7.1 x10 3/mm (ref 3.6–11.0)

## 2013-06-21 ENCOUNTER — Ambulatory Visit: Payer: Self-pay | Admitting: Oncology

## 2013-07-01 LAB — CBC CANCER CENTER
Eosinophil %: 0.6 %
Lymphocyte #: 1.3 x10 3/mm (ref 1.0–3.6)
Lymphocyte %: 19.7 %
MCV: 95 fL (ref 80–100)
Monocyte #: 0.7 x10 3/mm (ref 0.2–0.9)
Neutrophil %: 67.6 %
Platelet: 72 x10 3/mm — ABNORMAL LOW (ref 150–440)
RBC: 3.39 10*6/uL — ABNORMAL LOW (ref 3.80–5.20)

## 2013-07-01 LAB — COMPREHENSIVE METABOLIC PANEL
Alkaline Phosphatase: 171 U/L — ABNORMAL HIGH (ref 50–136)
Anion Gap: 8 (ref 7–16)
Creatinine: 0.69 mg/dL (ref 0.60–1.30)
EGFR (Non-African Amer.): >60
Osmolality: 285 (ref 275–301)
SGPT (ALT): 50 U/L (ref 12–78)
Total Protein: 6.3 g/dL — ABNORMAL LOW (ref 6.4–8.2)

## 2013-07-08 LAB — COMPREHENSIVE METABOLIC PANEL
Alkaline Phosphatase: 137 U/L — ABNORMAL HIGH (ref 50–136)
Anion Gap: 8 (ref 7–16)
Calcium, Total: 8.3 mg/dL — ABNORMAL LOW (ref 8.5–10.1)
Chloride: 105 mmol/L (ref 98–107)
EGFR (Non-African Amer.): >60
Glucose: 143 mg/dL — ABNORMAL HIGH (ref 65–99)
Osmolality: 283 (ref 275–301)
Potassium: 2.9 mmol/L — ABNORMAL LOW (ref 3.5–5.1)

## 2013-07-08 LAB — CBC CANCER CENTER
Basophil %: 1.4 %
HCT: 30.6 % — ABNORMAL LOW (ref 35.0–47.0)
HGB: 10.9 g/dL — ABNORMAL LOW (ref 12.0–16.0)
MCH: 34.2 pg — ABNORMAL HIGH (ref 26.0–34.0)
MCV: 96 fL (ref 80–100)
Monocyte #: 0.3 x10 3/mm (ref 0.2–0.9)
Monocyte %: 6.4 %
Neutrophil #: 3 x10 3/mm (ref 1.4–6.5)
Neutrophil %: 65.3 %
Platelet: 114 x10 3/mm — ABNORMAL LOW (ref 150–440)
RBC: 3.18 10*6/uL — ABNORMAL LOW (ref 3.80–5.20)
RDW: 16.9 % — ABNORMAL HIGH (ref 11.5–14.5)
WBC: 4.6 x10 3/mm (ref 3.6–11.0)

## 2013-07-22 ENCOUNTER — Ambulatory Visit: Payer: Self-pay | Admitting: Oncology

## 2013-07-22 LAB — COMPREHENSIVE METABOLIC PANEL
Albumin: 3.4 g/dL (ref 3.4–5.0)
Alkaline Phosphatase: 173 U/L — ABNORMAL HIGH (ref 50–136)
Anion Gap: 10 (ref 7–16)
BUN: 7 mg/dL (ref 7–18)
Calcium, Total: 8.6 mg/dL (ref 8.5–10.1)
Creatinine: 0.63 mg/dL (ref 0.60–1.30)
Glucose: 116 mg/dL — ABNORMAL HIGH (ref 65–99)
Osmolality: 286 (ref 275–301)
Potassium: 3 mmol/L — ABNORMAL LOW (ref 3.5–5.1)
SGPT (ALT): 43 U/L (ref 12–78)
Sodium: 144 mmol/L (ref 136–145)
Total Protein: 6.4 g/dL (ref 6.4–8.2)

## 2013-07-22 LAB — CBC CANCER CENTER
Basophil #: 0.1 x10 3/mm (ref 0.0–0.1)
HCT: 31.2 % — ABNORMAL LOW (ref 35.0–47.0)
HGB: 11 g/dL — ABNORMAL LOW (ref 12.0–16.0)
Lymphocyte %: 18.1 %
MCHC: 35.3 g/dL (ref 32.0–36.0)
MCV: 96 fL (ref 80–100)
Monocyte %: 6 %
Neutrophil #: 5.6 x10 3/mm (ref 1.4–6.5)
Neutrophil %: 73 %
RDW: 17.5 % — ABNORMAL HIGH (ref 11.5–14.5)
WBC: 7.7 x10 3/mm (ref 3.6–11.0)

## 2013-08-22 ENCOUNTER — Ambulatory Visit: Payer: Self-pay | Admitting: Oncology

## 2013-09-21 ENCOUNTER — Ambulatory Visit: Payer: Self-pay | Admitting: Oncology

## 2013-10-21 ENCOUNTER — Ambulatory Visit: Payer: Self-pay

## 2013-10-26 ENCOUNTER — Ambulatory Visit: Payer: Self-pay | Admitting: Oncology

## 2013-10-26 LAB — COMPREHENSIVE METABOLIC PANEL
Alkaline Phosphatase: 121 U/L (ref 50–136)
BUN: 11 mg/dL (ref 7–18)
Bilirubin,Total: 0.4 mg/dL (ref 0.2–1.0)
Calcium, Total: 9.2 mg/dL (ref 8.5–10.1)
Creatinine: 0.53 mg/dL — ABNORMAL LOW (ref 0.60–1.30)
Glucose: 89 mg/dL (ref 65–99)
SGOT(AST): 44 U/L — ABNORMAL HIGH (ref 15–37)
SGPT (ALT): 52 U/L (ref 12–78)
Sodium: 139 mmol/L (ref 136–145)
Total Protein: 7 g/dL (ref 6.4–8.2)

## 2013-10-26 LAB — CBC CANCER CENTER
Basophil #: 0.1 x10 3/mm (ref 0.0–0.1)
Basophil %: 1.4 %
Eosinophil #: 0.1 x10 3/mm (ref 0.0–0.7)
Eosinophil %: 1.5 %
Lymphocyte %: 40.6 %
MCH: 31.9 pg (ref 26.0–34.0)
MCHC: 33.6 g/dL (ref 32.0–36.0)
Monocyte #: 0.4 x10 3/mm (ref 0.2–0.9)
Neutrophil #: 2.2 x10 3/mm (ref 1.4–6.5)
Neutrophil %: 47 %
Platelet: 140 x10 3/mm — ABNORMAL LOW (ref 150–440)
RBC: 3.74 10*6/uL — ABNORMAL LOW (ref 3.80–5.20)

## 2013-10-28 LAB — CEA: CEA: 1.4 ng/mL (ref 0.0–4.7)

## 2013-10-28 LAB — CANCER ANTIGEN 27.29: CA 27.29: 16.6 U/mL (ref 0.0–38.6)

## 2013-11-21 ENCOUNTER — Ambulatory Visit: Payer: Self-pay | Admitting: Oncology

## 2013-12-05 ENCOUNTER — Ambulatory Visit: Payer: Self-pay | Admitting: Gastroenterology

## 2013-12-22 ENCOUNTER — Ambulatory Visit: Payer: Self-pay | Admitting: Oncology

## 2014-01-22 ENCOUNTER — Ambulatory Visit: Payer: Self-pay

## 2014-01-22 ENCOUNTER — Ambulatory Visit: Payer: Self-pay | Admitting: Oncology

## 2014-02-19 ENCOUNTER — Emergency Department: Payer: Self-pay | Admitting: Emergency Medicine

## 2014-02-19 ENCOUNTER — Ambulatory Visit: Payer: Self-pay | Admitting: Oncology

## 2014-02-24 LAB — CBC CANCER CENTER
Basophil #: 0 x10 3/mm (ref 0.0–0.1)
Basophil %: 0.8 %
EOS PCT: 1.1 %
Eosinophil #: 0.1 x10 3/mm (ref 0.0–0.7)
HCT: 37.6 % (ref 35.0–47.0)
HGB: 12.4 g/dL (ref 12.0–16.0)
LYMPHS PCT: 33.8 %
Lymphocyte #: 1.7 x10 3/mm (ref 1.0–3.6)
MCH: 29.9 pg (ref 26.0–34.0)
MCHC: 33.1 g/dL (ref 32.0–36.0)
MCV: 90 fL (ref 80–100)
Monocyte #: 0.3 x10 3/mm (ref 0.2–0.9)
Monocyte %: 5.6 %
Neutrophil #: 3 x10 3/mm (ref 1.4–6.5)
Neutrophil %: 58.7 %
PLATELETS: 149 x10 3/mm — AB (ref 150–440)
RBC: 4.16 10*6/uL (ref 3.80–5.20)
RDW: 13.3 % (ref 11.5–14.5)
WBC: 5.1 x10 3/mm (ref 3.6–11.0)

## 2014-02-24 LAB — BASIC METABOLIC PANEL
Anion Gap: 6 — ABNORMAL LOW (ref 7–16)
BUN: 14 mg/dL (ref 7–18)
CALCIUM: 9 mg/dL (ref 8.5–10.1)
CHLORIDE: 103 mmol/L (ref 98–107)
Co2: 33 mmol/L — ABNORMAL HIGH (ref 21–32)
Creatinine: 0.65 mg/dL (ref 0.60–1.30)
EGFR (Non-African Amer.): >60
GLUCOSE: 104 mg/dL — AB (ref 65–99)
OSMOLALITY: 284 (ref 275–301)
POTASSIUM: 3.4 mmol/L — AB (ref 3.5–5.1)
Sodium: 142 mmol/L (ref 136–145)

## 2014-02-25 LAB — CANCER ANTIGEN 27.29: CA 27.29: 18.6 U/mL (ref 0.0–38.6)

## 2014-02-25 LAB — CEA: CEA: 0.9 ng/mL (ref 0.0–4.7)

## 2014-03-08 ENCOUNTER — Ambulatory Visit: Payer: Self-pay | Admitting: Surgery

## 2014-03-22 ENCOUNTER — Ambulatory Visit: Payer: Self-pay | Admitting: Oncology

## 2014-05-25 ENCOUNTER — Ambulatory Visit: Payer: Self-pay | Admitting: Oncology

## 2014-05-26 LAB — CBC CANCER CENTER
Basophil #: 0 x10 3/mm (ref 0.0–0.1)
Basophil %: 0.6 %
EOS ABS: 0.1 x10 3/mm (ref 0.0–0.7)
Eosinophil %: 1.6 %
HCT: 38.6 % (ref 35.0–47.0)
HGB: 12.9 g/dL (ref 12.0–16.0)
LYMPHS PCT: 30.5 %
Lymphocyte #: 1.8 x10 3/mm (ref 1.0–3.6)
MCH: 30.3 pg (ref 26.0–34.0)
MCHC: 33.6 g/dL (ref 32.0–36.0)
MCV: 90 fL (ref 80–100)
MONOS PCT: 5.8 %
Monocyte #: 0.3 x10 3/mm (ref 0.2–0.9)
NEUTROS ABS: 3.7 x10 3/mm (ref 1.4–6.5)
NEUTROS PCT: 61.5 %
PLATELETS: 174 x10 3/mm (ref 150–440)
RBC: 4.27 10*6/uL (ref 3.80–5.20)
RDW: 13.2 % (ref 11.5–14.5)
WBC: 6 x10 3/mm (ref 3.6–11.0)

## 2014-05-29 LAB — CANCER ANTIGEN 27.29: CA 27.29: 20.3 U/mL (ref 0.0–38.6)

## 2014-06-21 ENCOUNTER — Ambulatory Visit: Payer: Self-pay | Admitting: Oncology

## 2014-08-23 ENCOUNTER — Ambulatory Visit: Payer: Self-pay | Admitting: Oncology

## 2014-08-23 LAB — CBC CANCER CENTER
BASOS PCT: 0.9 %
Basophil #: 0 x10 3/mm (ref 0.0–0.1)
Eosinophil #: 0.1 x10 3/mm (ref 0.0–0.7)
Eosinophil %: 1.4 %
HCT: 36.5 % (ref 35.0–47.0)
HGB: 12.1 g/dL (ref 12.0–16.0)
Lymphocyte #: 1.9 x10 3/mm (ref 1.0–3.6)
Lymphocyte %: 33.2 %
MCH: 30.9 pg (ref 26.0–34.0)
MCHC: 33.2 g/dL (ref 32.0–36.0)
MCV: 93 fL (ref 80–100)
MONO ABS: 0.3 x10 3/mm (ref 0.2–0.9)
MONOS PCT: 5.2 %
Neutrophil #: 3.5 x10 3/mm (ref 1.4–6.5)
Neutrophil %: 59.3 %
PLATELETS: 180 x10 3/mm (ref 150–440)
RBC: 3.93 10*6/uL (ref 3.80–5.20)
RDW: 14.3 % (ref 11.5–14.5)
WBC: 5.8 x10 3/mm (ref 3.6–11.0)

## 2014-08-23 LAB — BASIC METABOLIC PANEL
Anion Gap: 8 (ref 7–16)
BUN: 12 mg/dL (ref 7–18)
CHLORIDE: 105 mmol/L (ref 98–107)
Calcium, Total: 8.9 mg/dL (ref 8.5–10.1)
Co2: 29 mmol/L (ref 21–32)
Creatinine: 0.69 mg/dL (ref 0.60–1.30)
Glucose: 100 mg/dL — ABNORMAL HIGH (ref 65–99)
Osmolality: 283 (ref 275–301)
Potassium: 3.9 mmol/L (ref 3.5–5.1)
Sodium: 142 mmol/L (ref 136–145)

## 2014-08-24 LAB — CANCER ANTIGEN 27.29: CA 27.29: 16.9 U/mL (ref 0.0–38.6)

## 2014-08-24 LAB — CEA: CEA: 1.2 ng/mL (ref 0.0–4.7)

## 2014-09-21 ENCOUNTER — Ambulatory Visit: Payer: Self-pay | Admitting: Oncology

## 2015-02-01 ENCOUNTER — Ambulatory Visit: Payer: Self-pay | Admitting: Orthopedic Surgery

## 2015-02-01 NOTE — Progress Notes (Signed)
Preoperative surgical orders have been place into the Epic hospital system for JANELI LEWISON on 02/01/2015, 12:46 PM  by Mickel Crow for surgery on 03-07-2015.  Preop Bilateral Total Knee orders including Epidural per Anesthesia, IV Tylenol, and IV Decadron as long as there are no contraindications to the above medications. Arlee Muslim, PA-C

## 2015-02-20 DIAGNOSIS — I82409 Acute embolism and thrombosis of unspecified deep veins of unspecified lower extremity: Secondary | ICD-10-CM

## 2015-02-20 HISTORY — DX: Acute embolism and thrombosis of unspecified deep veins of unspecified lower extremity: I82.409

## 2015-02-22 ENCOUNTER — Ambulatory Visit
Admit: 2015-02-22 | Disposition: A | Discharge status: Home / Self Care | Payer: Self-pay | Attending: Oncology | Admitting: Oncology

## 2015-02-22 NOTE — H&P (Signed)
TOTAL KNEE ADMISSION H&P  Patient is being admitted for bilateral total knee arthroplasty.  Subjective:  Chief Complaint:bilateral knee pain.  HPI: Jamie Martinez, 58 y.o. female, has a history of pain and functional disability in the bilateral knee due to arthritis and has failed non-surgical conservative treatments for greater than 12 weeks to includeNSAID's and/or analgesics, corticosteriod injections, viscosupplementation injections and activity modification.  Onset of symptoms was gradual, starting >10 years ago with gradually worsening course since that time. The patient noted prior procedures on the knee to include  arthroscopy and menisectomy on the bilateral knee(s).  Patient currently rates pain in the bilateral knee(s) at 8 out of 10 with activity. Patient has night pain, worsening of pain with activity and weight bearing, pain that interferes with activities of daily living, pain with passive range of motion, crepitus and joint swelling.  Patient has evidence of periarticular osteophytes and joint space narrowing by imaging studies. There is no active infection.  Past Medical History History of colon cancer History of breast cancer Hypothyroidism Hemorrhoids Varicose Veins Osteoarthritis  Past Surgical History Arthroscopy of Knee. right 09/02/04; left 11/18/04 by Dr. Veverly Fells Breast Biopsy. left Breast Mass; Local Excision. left Colectomy. partial Colon Polyp Removal - Colonoscopy Hysterectomy. partial (non-cancerous)  NKDA  History  Substance Use Topics  . Smoking status: Quit smoking about 3 years ago  . Smokeless tobacco: None  . Alcohol Use: Rarely    Medications Letrozole 2.5 mg once daily Levothyroxine 300mg  once daily Hydrocodone 5-325mg  q 4-6 hours PRN pain   Review of Systems  Constitutional: Positive for diaphoresis. Negative for fever, chills, weight loss and malaise/fatigue.  Eyes: Negative.   Respiratory: Positive for shortness of breath. Negative  for cough, hemoptysis, sputum production and wheezing.        SOB with exertion  Cardiovascular: Negative.   Gastrointestinal: Negative.   Genitourinary: Negative.   Musculoskeletal: Positive for joint pain. Negative for myalgias, back pain, falls and neck pain.       Right and left knees  Skin: Negative.   Neurological: Negative.  Negative for weakness.  Endo/Heme/Allergies: Negative.   Psychiatric/Behavioral: Negative.     Objective:  Physical Exam  Constitutional: She is oriented to person, place, and time. She appears well-developed. No distress.  Overweight  HENT:  Head: Normocephalic and atraumatic.  Right Ear: External ear normal.  Left Ear: External ear normal.  Nose: Nose normal.  Mouth/Throat: Oropharynx is clear and moist.  Eyes: Conjunctivae and EOM are normal.  Neck: Normal range of motion. Neck supple.  Cardiovascular: Normal rate, regular rhythm, normal heart sounds and intact distal pulses.   No murmur heard. Respiratory: Effort normal and breath sounds normal. No respiratory distress. She has no wheezes.  GI: Soft. Bowel sounds are normal. She exhibits no distension. There is no tenderness.  Musculoskeletal:       Right hip: Normal.       Left hip: Normal.       Right knee: She exhibits decreased range of motion and swelling. She exhibits no effusion and no erythema. Tenderness found. Medial joint line and lateral joint line tenderness noted.       Left knee: She exhibits decreased range of motion and swelling. She exhibits no effusion and no erythema. Tenderness found. Medial joint line and lateral joint line tenderness noted.       Right lower leg: She exhibits no tenderness and no swelling.       Left lower leg: She exhibits no tenderness  and no swelling.  No rashes or lesions about the knee. No effusion or erythema. Both knees she lacks almost 10 degrees of extension, flexion back to 110 degrees. She is tender along the medial and lateral jointlines.   Neurological: She is alert and oriented to person, place, and time. She has normal strength and normal reflexes. No sensory deficit.  Skin: No rash noted. She is not diaphoretic. No erythema.  Psychiatric: She has a normal mood and affect. Her behavior is normal.   Vitals  Weight: 202.6 lb Height: 68.5in Body Surface Area: 2.07 m Body Mass Index: 30.36 kg/m  Pulse: 80 (Regular)  BP: 122/78 (Sitting, Left Arm, Standard)  Imaging Review Plain radiographs demonstrate severe degenerative joint disease of the right and left knee(s). The overall alignment ismild varus. The bone quality appears to be good for age and reported activity level.  Assessment/Plan:  End stage primary osteoarthritis, right and left knee   The patient history, physical examination, clinical judgment of the provider and imaging studies are consistent with end stage degenerative joint disease of the right and left knee(s) and total knee arthroplasty is deemed medically necessary. The treatment options including medical management, injection therapy arthroscopy and arthroplasty were discussed at length. The risks and benefits of total knee arthroplasty were presented and reviewed. The risks due to aseptic loosening, infection, stiffness, patella tracking problems, thromboembolic complications and other imponderables were discussed. The patient acknowledged the explanation, agreed to proceed with the plan and consent was signed. Patient is being admitted for inpatient treatment for surgery, pain control, PT, OT, prophylactic antibiotics, VTE prophylaxis, progressive ambulation and ADL's and discharge planning. The patient is planning to be discharged to inpatient rehab (CIR)  PCP: Macie Burows Onco: Dr. Delight Hoh TXA IV (no active malignancy)    Ardeen Jourdain, PA-C

## 2015-02-23 ENCOUNTER — Ambulatory Visit: Payer: Self-pay | Admitting: Oncology

## 2015-02-23 NOTE — Anesthesia Preprocedure Evaluation (Addendum)
Anesthesia Evaluation  Patient identified by MRN, date of birth, ID band Patient awake    Reviewed: Allergy & Precautions, H&P , NPO status , Patient's Chart, lab work & pertinent test results  Airway Mallampati: II  TM Distance: >3 FB Neck ROM: full    Dental  (+) Edentulous Upper, Edentulous Lower, Dental Advisory Given   Pulmonary neg pulmonary ROS,  breath sounds clear to auscultation  Pulmonary exam normal       Cardiovascular Exercise Tolerance: Good negative cardio ROS  Rhythm:regular Rate:Normal     Neuro/Psych negative neurological ROS  negative psych ROS   GI/Hepatic negative GI ROS, Neg liver ROS,   Endo/Other  negative endocrine ROSHypothyroidism   Renal/GU negative Renal ROS  negative genitourinary   Musculoskeletal   Abdominal   Peds  Hematology negative hematology ROS (+)   Anesthesia Other Findings Left breast cancer  Reproductive/Obstetrics negative OB ROS                            Anesthesia Physical Anesthesia Plan  ASA: II  Anesthesia Plan: General   Post-op Pain Management:    Induction: Intravenous  Airway Management Planned: Oral ETT  Additional Equipment:   Intra-op Plan:   Post-operative Plan: Extubation in OR  Informed Consent: I have reviewed the patients History and Physical, chart, labs and discussed the procedure including the risks, benefits and alternatives for the proposed anesthesia with the patient or authorized representative who has indicated his/her understanding and acceptance.   Dental Advisory Given  Plan Discussed with: CRNA and Surgeon  Anesthesia Plan Comments:         Anesthesia Quick Evaluation

## 2015-02-28 ENCOUNTER — Ambulatory Visit: Payer: Self-pay | Admitting: Oncology

## 2015-02-28 ENCOUNTER — Other Ambulatory Visit (HOSPITAL_COMMUNITY): Payer: Self-pay | Admitting: *Deleted

## 2015-02-28 NOTE — Patient Instructions (Addendum)
Jamie Martinez  02/28/2015   Your procedure is scheduled on: Wednesday March 16th, 2016  Report to [facility] Main  Entrance and follow signs to               [place] at 845 AM.  Call this number if you have problems the morning of surgery [phone]   Remember: SHORT STAY WILL DRAW YOUR BLOOD TYPE MORNING OF YOUR SURGERY  Do not eat food or drink liquids :After Midnight.     Take these medicines the morning of surgery with A SIP OF WATER:LETROZOLE, HYDROCODONE                               You may not have any metal on your body including hair pins and              piercings  Do not wear jewelry, make-up, lotions, powders or perfumes.             Do not wear nail polish.  Do not shave  48 hours prior to surgery.              Men may shave face and neck.   Do not bring valuables to the hospital. [place].  Contacts, dentures or bridgework may not be worn into surgery.  Leave suitcase in the car. After surgery it may be brought to your room.     Patients discharged the day of surgery will not be allowed to drive home.  Name and phone number of your driver:  Special Instructions: N/A              Please read over the following fact sheets you were given: _____________________________________________________________________             [facility] - Preparing for Surgery Before surgery, you can play an important role.  Because skin is not sterile, your skin needs to be as free of germs as possible.  You can reduce the number of germs on your skin by washing with CHG (chlorahexidine gluconate) soap before surgery.  CHG is an antiseptic cleaner which kills germs and bonds with the skin to continue killing germs even after washing. Please DO NOT use if you have an allergy to CHG or antibacterial soaps.  If your skin becomes reddened/irritated stop using the CHG and inform your nurse when you arrive at  Short Stay. Do not shave (including legs and underarms) for at least 48 hours prior to the first CHG shower.  You may shave your face/neck. Please follow these instructions carefully:  1.  Shower with CHG Soap the night before surgery and the  morning of Surgery.  2.  If you choose to wash your hair, wash your hair first as usual with your  normal  shampoo.  3.  After you shampoo, rinse your hair and body thoroughly to remove the  shampoo.                           4.  Use CHG as you would any other liquid soap.  You can apply chg directly  to the skin and wash  Gently with a scrungie or clean washcloth.  5.  Apply the CHG Soap to your body ONLY FROM THE NECK DOWN.   Do not use on face/ open                           Wound or open sores. Avoid contact with eyes, ears mouth and genitals (private parts).                       Wash face,  Genitals (private parts) with your normal soap.             6.  Wash thoroughly, paying special attention to the area where your surgery  will be performed.  7.  Thoroughly rinse your body with warm water from the neck down.  8.  DO NOT shower/wash with your normal soap after using and rinsing off  the CHG Soap.                9.  Pat yourself dry with a clean towel.            10.  Wear clean pajamas.            11.  Place clean sheets on your bed the night of your first shower and do not  sleep with pets. Day of Surgery : Do not apply any lotions/deodorants the morning of surgery.  Please wear clean clothes to the hospital/surgery center.  FAILURE TO FOLLOW THESE INSTRUCTIONS MAY RESULT IN THE CANCELLATION OF YOUR SURGERY PATIENT SIGNATURE_________________________________  NURSE SIGNATURE__________________________________  ________________________________________________________________________   Jamie Martinez  An incentive spirometer is a tool that can help keep your lungs clear and active. This tool measures how well you are  filling your lungs with each breath. Taking long deep breaths may help reverse or decrease the chance of developing breathing (pulmonary) problems (especially infection) following:  A long period of time when you are unable to move or be active. BEFORE THE PROCEDURE   If the spirometer includes an indicator to show your best effort, your nurse or respiratory therapist will set it to a desired goal.  If possible, sit up straight or lean slightly forward. Try not to slouch.  Hold the incentive spirometer in an upright position. INSTRUCTIONS FOR USE   Sit on the edge of your bed if possible, or sit up as far as you can in bed or on a chair.  Hold the incentive spirometer in an upright position.  Breathe out normally.  Place the mouthpiece in your mouth and seal your lips tightly around it.  Breathe in slowly and as deeply as possible, raising the piston or the ball toward the top of the column.  Hold your breath for 3-5 seconds or for as long as possible. Allow the piston or ball to fall to the bottom of the column.  Remove the mouthpiece from your mouth and breathe out normally.  Rest for a few seconds and repeat Steps 1 through 7 at least 10 times every 1-2 hours when you are awake. Take your time and take a few normal breaths between deep breaths.  The spirometer may include an indicator to show your best effort. Use the indicator as a goal to work toward during each repetition.  After each set of 10 deep breaths, practice coughing to be sure your lungs are clear. If you have an incision (the cut made at the time of surgery),  support your incision when coughing by placing a pillow or rolled up towels firmly against it. Once you are able to get out of bed, walk around indoors and cough well. You may stop using the incentive spirometer when instructed by your caregiver.  RISKS AND COMPLICATIONS  Take your time so you do not get dizzy or light-headed.  If you are in pain, you may  need to take or ask for pain medication before doing incentive spirometry. It is harder to take a deep breath if you are having pain. AFTER USE  Rest and breathe slowly and easily.  It can be helpful to keep track of a log of your progress. Your caregiver can provide you with a simple table to help with this. If you are using the spirometer at home, follow these instructions: [place] IF:   You are having difficultly using the spirometer.  You have trouble using the spirometer as often as instructed.  Your pain medication is not giving enough relief while using the spirometer.  You develop fever of 100.5 F (38.1 C) or higher. SEEK IMMEDIATE MEDICAL CARE IF:   You cough up bloody sputum that had not been present before.  You develop fever of 102 F (38.9 C) or greater.  You develop worsening pain at or near the incision site. MAKE SURE YOU:   Understand these instructions.  Will watch your condition.  Will get help right away if you are not doing well or get worse. Document Released: 04/20/2007 Document Revised: 03/01/2012 Document Reviewed: 06/21/2007 ExitCare Patient Information 2014 Exit[employer], [place].   ________________________________________________________________________  WHAT IS A BLOOD TRANSFUSION? Blood Transfusion Information  A transfusion is the replacement of blood or some of its parts. Blood is made up of multiple cells which provide different functions.  Red blood cells carry oxygen and are used for blood loss replacement.  White blood cells fight against infection.  Platelets control bleeding.  Plasma helps clot blood.  Other blood products are available for specialized needs, such as hemophilia or other clotting disorders. BEFORE THE TRANSFUSION  Who gives blood for transfusions?   Healthy volunteers who are fully evaluated to make sure their blood is safe. This is blood bank blood. Transfusion therapy is the safest it has ever been in  the practice of medicine. Before blood is taken from a donor, a complete history is taken to make sure that person has no history of diseases nor engages in risky social behavior (examples are intravenous drug use or sexual activity with multiple partners). The donor's travel history is screened to minimize risk of transmitting infections, such as malaria. The donated blood is tested for signs of infectious diseases, such as HIV and hepatitis. The blood is then tested to be sure it is compatible with you in order to minimize the chance of a transfusion reaction. If you or a relative donates blood, this is often done in anticipation of surgery and is not appropriate for emergency situations. It takes many days to process the donated blood. RISKS AND COMPLICATIONS Although transfusion therapy is very safe and saves many lives, the main dangers of transfusion include:   Getting an infectious disease.  Developing a transfusion reaction. This is an allergic reaction to something in the blood you were given. Every precaution is taken to prevent this. The decision to have a blood transfusion has been considered carefully by your caregiver before blood is given. Blood is not given unless the benefits outweigh the risks. AFTER THE TRANSFUSION  Right after receiving a blood transfusion, you will usually feel much better and more energetic. This is especially true if your red blood cells have gotten low (anemic). The transfusion raises the level of the red blood cells which carry oxygen, and this usually causes an energy increase.  The nurse administering the transfusion will monitor you carefully for complications. HOME CARE INSTRUCTIONS  No special instructions are needed after a transfusion. You may find your energy is better. Speak with your caregiver about any limitations on activity for underlying diseases you may have. SEEK MEDICAL CARE IF:   Your condition is not improving after your  transfusion.  You develop redness or irritation at the intravenous (IV) site. SEEK IMMEDIATE MEDICAL CARE IF:  Any of the following symptoms occur over the next 12 hours:  Shaking chills.  You have a temperature by mouth above 102 F (38.9 C), not controlled by medicine.  Chest, back, or muscle pain.  People around you feel you are not acting correctly or are confused.  Shortness of breath or difficulty breathing.  Dizziness and fainting.  You get a rash or develop hives.  You have a decrease in urine output.  Your urine turns a dark color or changes to pink, red, or brown. Any of the following symptoms occur over the next 10 days:  You have a temperature by mouth above 102 F (38.9 C), not controlled by medicine.  Shortness of breath.  Weakness after normal activity.  The white part of the eye turns yellow (jaundice).  You have a decrease in the amount of urine or are urinating less often.  Your urine turns a dark color or changes to pink, red, or brown. Document Released: 12/05/2000 Document Revised: 03/01/2012 Document Reviewed: 07/24/2008 [facility] Patient Information 2014 [place], [place].  _______________________________________________________________________

## 2015-03-01 ENCOUNTER — Encounter (HOSPITAL_COMMUNITY): Payer: Self-pay

## 2015-03-01 ENCOUNTER — Encounter (HOSPITAL_COMMUNITY)
Admission: RE | Admit: 2015-03-01 | Discharge: 2015-03-01 | Disposition: A | Discharge status: Home / Self Care | Payer: PRIVATE HEALTH INSURANCE | Source: Ambulatory Visit | Attending: Orthopedic Surgery | Admitting: Orthopedic Surgery

## 2015-03-01 DIAGNOSIS — M170 Bilateral primary osteoarthritis of knee: Secondary | ICD-10-CM | POA: Diagnosis not present

## 2015-03-01 DIAGNOSIS — Z01812 Encounter for preprocedural laboratory examination: Secondary | ICD-10-CM | POA: Diagnosis present

## 2015-03-01 HISTORY — DX: Unspecified osteoarthritis, unspecified site: M19.90

## 2015-03-01 HISTORY — DX: Hypothyroidism, unspecified: E03.9

## 2015-03-01 LAB — URINALYSIS, ROUTINE W REFLEX MICROSCOPIC
Bilirubin Urine: NEGATIVE
Glucose, UA: NEGATIVE mg/dL
Hgb urine dipstick: NEGATIVE
KETONES UR: NEGATIVE mg/dL
Leukocytes, UA: NEGATIVE
Nitrite: NEGATIVE
PROTEIN: NEGATIVE mg/dL
Specific Gravity, Urine: 1.013 (ref 1.005–1.030)
UROBILINOGEN UA: 1 mg/dL (ref 0.0–1.0)
pH: 6.5 (ref 5.0–8.0)

## 2015-03-01 LAB — COMPREHENSIVE METABOLIC PANEL
ALT: 15 U/L (ref 0–35)
AST: 20 U/L (ref 0–37)
Albumin: 4 g/dL (ref 3.5–5.2)
Alkaline Phosphatase: 68 U/L (ref 39–117)
Anion gap: 8 (ref 5–15)
BUN: 14 mg/dL (ref 6–23)
CHLORIDE: 106 mmol/L (ref 96–112)
CO2: 29 mmol/L (ref 19–32)
CREATININE: 0.56 mg/dL (ref 0.50–1.10)
Calcium: 9.3 mg/dL (ref 8.4–10.5)
Glucose, Bld: 101 mg/dL — ABNORMAL HIGH (ref 70–99)
Potassium: 4 mmol/L (ref 3.5–5.1)
SODIUM: 143 mmol/L (ref 135–145)
Total Bilirubin: 0.5 mg/dL (ref 0.3–1.2)
Total Protein: 6.8 g/dL (ref 6.0–8.3)

## 2015-03-01 LAB — CBC
HEMATOCRIT: 37.2 % (ref 36.0–46.0)
HEMOGLOBIN: 12.1 g/dL (ref 12.0–15.0)
MCH: 30 pg (ref 26.0–34.0)
MCHC: 32.5 g/dL (ref 30.0–36.0)
MCV: 92.3 fL (ref 78.0–100.0)
Platelets: 206 10*3/uL (ref 150–400)
RBC: 4.03 MIL/uL (ref 3.87–5.11)
RDW: 13.2 % (ref 11.5–15.5)
WBC: 5.4 10*3/uL (ref 4.0–10.5)

## 2015-03-01 LAB — SURGICAL PCR SCREEN
MRSA, PCR: NEGATIVE
STAPHYLOCOCCUS AUREUS: NEGATIVE

## 2015-03-01 LAB — PROTIME-INR
INR: 0.96 (ref 0.00–1.49)
Prothrombin Time: 12.9 seconds (ref 11.6–15.2)

## 2015-03-01 LAB — APTT: aPTT: 36 seconds (ref 24–37)

## 2015-03-01 NOTE — Progress Notes (Signed)
MEDICAL CLEARANCE NOTE DR [facility] ON CHART FOR 03-07-15 SURGERY EKG 01-10-15 DR [place] ON CHART

## 2015-03-07 ENCOUNTER — Encounter (HOSPITAL_COMMUNITY): Payer: Self-pay | Admitting: *Deleted

## 2015-03-07 ENCOUNTER — Inpatient Hospital Stay (HOSPITAL_COMMUNITY)
Admission: RE | Admit: 2015-03-07 | Discharge: 2015-03-12 | DRG: 462 | Disposition: A | Discharge status: Rehabilitation Facility | Payer: PRIVATE HEALTH INSURANCE | Source: Ambulatory Visit | Attending: Orthopedic Surgery | Admitting: Orthopedic Surgery

## 2015-03-07 ENCOUNTER — Inpatient Hospital Stay (HOSPITAL_COMMUNITY): Payer: PRIVATE HEALTH INSURANCE | Admitting: Anesthesiology

## 2015-03-07 ENCOUNTER — Encounter (HOSPITAL_COMMUNITY)
Admission: RE | Disposition: A | Discharge status: Rehabilitation Facility | Payer: Self-pay | Source: Ambulatory Visit | Attending: Orthopedic Surgery

## 2015-03-07 DIAGNOSIS — Z853 Personal history of malignant neoplasm of breast: Secondary | ICD-10-CM

## 2015-03-07 DIAGNOSIS — D62 Acute posthemorrhagic anemia: Secondary | ICD-10-CM | POA: Diagnosis not present

## 2015-03-07 DIAGNOSIS — E039 Hypothyroidism, unspecified: Secondary | ICD-10-CM | POA: Diagnosis present

## 2015-03-07 DIAGNOSIS — Z6831 Body mass index (BMI) 31.0-31.9, adult: Secondary | ICD-10-CM | POA: Diagnosis not present

## 2015-03-07 DIAGNOSIS — Z01812 Encounter for preprocedural laboratory examination: Secondary | ICD-10-CM

## 2015-03-07 DIAGNOSIS — Z79899 Other long term (current) drug therapy: Secondary | ICD-10-CM

## 2015-03-07 DIAGNOSIS — M170 Bilateral primary osteoarthritis of knee: Secondary | ICD-10-CM

## 2015-03-07 DIAGNOSIS — M25569 Pain in unspecified knee: Secondary | ICD-10-CM | POA: Diagnosis present

## 2015-03-07 DIAGNOSIS — M179 Osteoarthritis of knee, unspecified: Secondary | ICD-10-CM | POA: Diagnosis present

## 2015-03-07 DIAGNOSIS — Z87891 Personal history of nicotine dependence: Secondary | ICD-10-CM | POA: Diagnosis not present

## 2015-03-07 DIAGNOSIS — M1710 Unilateral primary osteoarthritis, unspecified knee: Secondary | ICD-10-CM | POA: Diagnosis present

## 2015-03-07 DIAGNOSIS — E663 Overweight: Secondary | ICD-10-CM | POA: Diagnosis present

## 2015-03-07 DIAGNOSIS — Z85038 Personal history of other malignant neoplasm of large intestine: Secondary | ICD-10-CM

## 2015-03-07 HISTORY — PX: TOTAL KNEE ARTHROPLASTY: SHX125

## 2015-03-07 LAB — ABO/RH: ABO/RH(D): A POS

## 2015-03-07 SURGERY — ARTHROPLASTY, KNEE, BILATERAL, TOTAL
Anesthesia: General | Site: Knee | Laterality: Bilateral

## 2015-03-07 MED ORDER — METOCLOPRAMIDE HCL 5 MG/ML IJ SOLN: 5.0000 mg | Freq: Three times a day (TID) | INTRAMUSCULAR | Status: DC | PRN

## 2015-03-07 MED ORDER — PHENOL 1.4 % MT LIQD: 1.0000 | OROMUCOSAL | Status: DC | PRN

## 2015-03-07 MED ORDER — BISACODYL 10 MG RE SUPP: 10.0000 mg | Freq: Every day | RECTAL | Status: DC | PRN

## 2015-03-07 MED ORDER — ONDANSETRON HCL 4 MG PO TABS: 4.0000 mg | ORAL_TABLET | Freq: Four times a day (QID) | ORAL | Status: DC | PRN

## 2015-03-07 MED ORDER — DOCUSATE SODIUM 100 MG PO CAPS
100.0000 mg | ORAL_CAPSULE | Freq: Two times a day (BID) | ORAL | Status: DC
  Administered 2015-03-07 – 2015-03-12 (×10): 100 mg via ORAL

## 2015-03-07 MED ORDER — LIDOCAINE HCL (CARDIAC) 20 MG/ML IV SOLN
INTRAVENOUS | Status: AC
  Filled 2015-03-07: qty 5

## 2015-03-07 MED ORDER — HYDROMORPHONE HCL 2 MG/ML IJ SOLN
INTRAMUSCULAR | Status: AC
  Filled 2015-03-07: qty 1

## 2015-03-07 MED ORDER — ONDANSETRON HCL 4 MG/2ML IJ SOLN
INTRAMUSCULAR | Status: AC
  Filled 2015-03-07: qty 2

## 2015-03-07 MED ORDER — WARFARIN - PHARMACIST DOSING INPATIENT
Freq: Every day | Status: DC
  Administered 2015-03-09: 1

## 2015-03-07 MED ORDER — METHOCARBAMOL 500 MG PO TABS
500.0000 mg | ORAL_TABLET | Freq: Four times a day (QID) | ORAL | Status: DC | PRN
  Administered 2015-03-08 – 2015-03-12 (×11): 500 mg via ORAL
  Filled 2015-03-07 (×12): qty 1

## 2015-03-07 MED ORDER — HYDROMORPHONE HCL 1 MG/ML IJ SOLN
INTRAMUSCULAR | Status: DC | PRN
  Administered 2015-03-07 (×2): 1 mg via INTRAVENOUS

## 2015-03-07 MED ORDER — MIDAZOLAM HCL 5 MG/5ML IJ SOLN
INTRAMUSCULAR | Status: DC | PRN
  Administered 2015-03-07: 2 mg via INTRAVENOUS

## 2015-03-07 MED ORDER — LACTATED RINGERS IV SOLN: INTRAVENOUS | Status: DC

## 2015-03-07 MED ORDER — TRAMADOL HCL 50 MG PO TABS: 50.0000 mg | ORAL_TABLET | Freq: Four times a day (QID) | ORAL | Status: DC | PRN

## 2015-03-07 MED ORDER — METOCLOPRAMIDE HCL 10 MG PO TABS: 5.0000 mg | ORAL_TABLET | Freq: Three times a day (TID) | ORAL | Status: DC | PRN

## 2015-03-07 MED ORDER — MORPHINE SULFATE 2 MG/ML IJ SOLN
1.0000 mg | INTRAMUSCULAR | Status: DC | PRN
  Administered 2015-03-08 – 2015-03-11 (×10): 2 mg via INTRAVENOUS
  Filled 2015-03-07 (×10): qty 1

## 2015-03-07 MED ORDER — SODIUM CHLORIDE 0.9 % IJ SOLN
INTRAMUSCULAR | Status: AC
  Filled 2015-03-07: qty 50

## 2015-03-07 MED ORDER — ROCURONIUM BROMIDE 100 MG/10ML IV SOLN
INTRAVENOUS | Status: DC | PRN
  Administered 2015-03-07: 40 mg via INTRAVENOUS

## 2015-03-07 MED ORDER — POLYETHYLENE GLYCOL 3350 17 G PO PACK: 17.0000 g | PACK | Freq: Every day | ORAL | Status: DC | PRN

## 2015-03-07 MED ORDER — FENTANYL CITRATE 0.05 MG/ML IJ SOLN
INTRAMUSCULAR | Status: DC | PRN
  Administered 2015-03-07: 100 ug via INTRAVENOUS

## 2015-03-07 MED ORDER — FLEET ENEMA 7-19 GM/118ML RE ENEM: 1.0000 | ENEMA | Freq: Once | RECTAL | Status: AC | PRN

## 2015-03-07 MED ORDER — FENTANYL CITRATE 0.05 MG/ML IJ SOLN
INTRAMUSCULAR | Status: AC
  Filled 2015-03-07: qty 2

## 2015-03-07 MED ORDER — ACETAMINOPHEN 325 MG PO TABS
650.0000 mg | ORAL_TABLET | Freq: Four times a day (QID) | ORAL | Status: DC | PRN
  Administered 2015-03-09 (×2): 650 mg via ORAL
  Filled 2015-03-07 (×3): qty 2

## 2015-03-07 MED ORDER — METHOCARBAMOL 1000 MG/10ML IJ SOLN
500.0000 mg | Freq: Four times a day (QID) | INTRAVENOUS | Status: DC | PRN
  Administered 2015-03-07: 500 mg via INTRAVENOUS
  Filled 2015-03-07 (×2): qty 5

## 2015-03-07 MED ORDER — HYDROMORPHONE HCL 1 MG/ML IJ SOLN: 0.2500 mg | INTRAMUSCULAR | Status: DC | PRN

## 2015-03-07 MED ORDER — BUPIVACAINE HCL (PF) 0.25 % IJ SOLN
INTRAMUSCULAR | Status: AC
  Filled 2015-03-07: qty 30

## 2015-03-07 MED ORDER — GLYCOPYRROLATE 0.2 MG/ML IJ SOLN
INTRAMUSCULAR | Status: DC | PRN
  Administered 2015-03-07: 0.6 mg via INTRAVENOUS

## 2015-03-07 MED ORDER — ONDANSETRON HCL 4 MG/2ML IJ SOLN
INTRAMUSCULAR | Status: DC | PRN
  Administered 2015-03-07: 4 mg via INTRAVENOUS

## 2015-03-07 MED ORDER — TRANEXAMIC ACID 100 MG/ML IV SOLN
1000.0000 mg | INTRAVENOUS | Status: AC
  Administered 2015-03-07: 1000 mg via INTRAVENOUS
  Filled 2015-03-07: qty 10

## 2015-03-07 MED ORDER — SODIUM CHLORIDE 0.9 % IR SOLN
Status: DC | PRN
  Administered 2015-03-07 (×2): 1000 mL

## 2015-03-07 MED ORDER — SODIUM CHLORIDE 0.9 % IV SOLN: INTRAVENOUS | Status: DC

## 2015-03-07 MED ORDER — NEOSTIGMINE METHYLSULFATE 10 MG/10ML IV SOLN
INTRAVENOUS | Status: DC | PRN
Start: 2015-03-07 — End: 2015-03-07
  Administered 2015-03-07: 4 mg via INTRAVENOUS

## 2015-03-07 MED ORDER — MENTHOL 3 MG MT LOZG: 1.0000 | LOZENGE | OROMUCOSAL | Status: DC | PRN

## 2015-03-07 MED ORDER — PROPOFOL 10 MG/ML IV BOLUS
INTRAVENOUS | Status: DC | PRN
  Administered 2015-03-07: 150 mg via INTRAVENOUS

## 2015-03-07 MED ORDER — ROPIVACAINE HCL 2 MG/ML IJ SOLN
12.0000 mL/h | INTRAMUSCULAR | Status: AC
  Administered 2015-03-07 – 2015-03-09 (×4): 12 mL/h via EPIDURAL
  Filled 2015-03-07 (×9): qty 200

## 2015-03-07 MED ORDER — DEXAMETHASONE SODIUM PHOSPHATE 10 MG/ML IJ SOLN
INTRAMUSCULAR | Status: AC
  Filled 2015-03-07: qty 1

## 2015-03-07 MED ORDER — ONDANSETRON HCL 4 MG/2ML IJ SOLN: 4.0000 mg | Freq: Four times a day (QID) | INTRAMUSCULAR | Status: DC | PRN

## 2015-03-07 MED ORDER — SODIUM CHLORIDE 0.9 % IV SOLN
INTRAVENOUS | Status: DC
  Administered 2015-03-07 – 2015-03-09 (×3): via INTRAVENOUS

## 2015-03-07 MED ORDER — ACETAMINOPHEN 650 MG RE SUPP: 650.0000 mg | Freq: Four times a day (QID) | RECTAL | Status: DC | PRN

## 2015-03-07 MED ORDER — CEFAZOLIN SODIUM-DEXTROSE 2-3 GM-% IV SOLR
INTRAVENOUS | Status: AC
  Filled 2015-03-07: qty 50

## 2015-03-07 MED ORDER — LIDOCAINE HCL (CARDIAC) 20 MG/ML IV SOLN
INTRAVENOUS | Status: DC | PRN
  Administered 2015-03-07: 50 mg via INTRAVENOUS

## 2015-03-07 MED ORDER — CEFAZOLIN SODIUM-DEXTROSE 2-3 GM-% IV SOLR
2.0000 g | INTRAVENOUS | Status: AC
  Administered 2015-03-07: 2 g via INTRAVENOUS

## 2015-03-07 MED ORDER — DEXAMETHASONE SODIUM PHOSPHATE 10 MG/ML IJ SOLN
10.0000 mg | Freq: Once | INTRAMUSCULAR | Status: AC
  Administered 2015-03-08: 10 mg via INTRAVENOUS
  Filled 2015-03-07: qty 1

## 2015-03-07 MED ORDER — LACTATED RINGERS IV SOLN
INTRAVENOUS | Status: DC
  Administered 2015-03-07: 1000 mL via INTRAVENOUS
  Administered 2015-03-07: 12:00:00 via INTRAVENOUS

## 2015-03-07 MED ORDER — PROPOFOL 10 MG/ML IV BOLUS
INTRAVENOUS | Status: AC
  Filled 2015-03-07: qty 20

## 2015-03-07 MED ORDER — DIPHENHYDRAMINE HCL 12.5 MG/5ML PO ELIX: 12.5000 mg | ORAL_SOLUTION | ORAL | Status: DC | PRN

## 2015-03-07 MED ORDER — OXYCODONE HCL 5 MG PO TABS
5.0000 mg | ORAL_TABLET | ORAL | Status: DC | PRN
  Administered 2015-03-08: 10 mg via ORAL
  Administered 2015-03-08 (×2): 5 mg via ORAL
  Administered 2015-03-09: 15 mg via ORAL
  Administered 2015-03-09: 10 mg via ORAL
  Administered 2015-03-09 (×4): 15 mg via ORAL
  Administered 2015-03-09: 10 mg via ORAL
  Administered 2015-03-10 – 2015-03-12 (×16): 15 mg via ORAL
  Filled 2015-03-07 (×4): qty 3
  Filled 2015-03-07 (×2): qty 2
  Filled 2015-03-07 (×7): qty 3
  Filled 2015-03-07: qty 1
  Filled 2015-03-07: qty 3
  Filled 2015-03-07: qty 1
  Filled 2015-03-07 (×6): qty 3
  Filled 2015-03-07: qty 2
  Filled 2015-03-07 (×3): qty 3

## 2015-03-07 MED ORDER — NEOSTIGMINE METHYLSULFATE 10 MG/10ML IV SOLN
INTRAVENOUS | Status: AC
  Filled 2015-03-07: qty 1

## 2015-03-07 MED ORDER — LEVOTHYROXINE SODIUM 150 MCG PO TABS
300.0000 ug | ORAL_TABLET | Freq: Every evening | ORAL | Status: DC
  Administered 2015-03-08 – 2015-03-11 (×4): 300 ug via ORAL
  Filled 2015-03-07 (×5): qty 2

## 2015-03-07 MED ORDER — BUPIVACAINE HCL (PF) 0.25 % IJ SOLN
INTRAMUSCULAR | Status: DC | PRN
  Administered 2015-03-07: 15 mL

## 2015-03-07 MED ORDER — ACETAMINOPHEN 10 MG/ML IV SOLN
1000.0000 mg | Freq: Once | INTRAVENOUS | Status: AC
  Administered 2015-03-07: 1000 mg via INTRAVENOUS
  Filled 2015-03-07: qty 100

## 2015-03-07 MED ORDER — LETROZOLE 2.5 MG PO TABS
2.5000 mg | ORAL_TABLET | Freq: Every day | ORAL | Status: DC
  Administered 2015-03-07 – 2015-03-12 (×6): 2.5 mg via ORAL
  Filled 2015-03-07 (×7): qty 1

## 2015-03-07 MED ORDER — ACETAMINOPHEN 500 MG PO TABS
1000.0000 mg | ORAL_TABLET | Freq: Four times a day (QID) | ORAL | Status: AC
  Administered 2015-03-07 – 2015-03-08 (×4): 1000 mg via ORAL
  Filled 2015-03-07 (×5): qty 2

## 2015-03-07 MED ORDER — DEXAMETHASONE SODIUM PHOSPHATE 10 MG/ML IJ SOLN
10.0000 mg | Freq: Once | INTRAMUSCULAR | Status: AC
  Administered 2015-03-07: 10 mg via INTRAVENOUS

## 2015-03-07 MED ORDER — ROCURONIUM BROMIDE 100 MG/10ML IV SOLN
INTRAVENOUS | Status: AC
  Filled 2015-03-07: qty 1

## 2015-03-07 MED ORDER — CEFAZOLIN SODIUM-DEXTROSE 2-3 GM-% IV SOLR
2.0000 g | Freq: Four times a day (QID) | INTRAVENOUS | Status: AC
  Administered 2015-03-07 (×2): 2 g via INTRAVENOUS
  Filled 2015-03-07 (×2): qty 50

## 2015-03-07 MED ORDER — MIDAZOLAM HCL 2 MG/2ML IJ SOLN
INTRAMUSCULAR | Status: AC
  Filled 2015-03-07: qty 2

## 2015-03-07 MED ORDER — GLYCOPYRROLATE 0.2 MG/ML IJ SOLN
INTRAMUSCULAR | Status: AC
  Filled 2015-03-07: qty 3

## 2015-03-07 SURGICAL SUPPLY — 58 items
BAG ZIPLOCK 12X15 (MISCELLANEOUS) ×6 IMPLANT
BANDAGE ELASTIC 6 VELCRO ST LF (GAUZE/BANDAGES/DRESSINGS) ×6 IMPLANT
BANDAGE ESMARK 6X9 LF (GAUZE/BANDAGES/DRESSINGS) ×2 IMPLANT
BLADE SAG 18X100X1.27 (BLADE) ×3 IMPLANT
BLADE SAW SGTL 11.0X1.19X90.0M (BLADE) ×3 IMPLANT
BLADE SURG SZ10 CARB STEEL (BLADE) ×6 IMPLANT
BNDG COHESIVE 6X5 TAN STRL LF (GAUZE/BANDAGES/DRESSINGS) ×3 IMPLANT
BNDG ESMARK 6X9 LF (GAUZE/BANDAGES/DRESSINGS) ×6
BOWL SMART MIX CTS (DISPOSABLE) ×6 IMPLANT
CAPT KNEE TOTAL 3 ATTUNE ×6 IMPLANT
CEMENT HV SMART SET (Cement) ×12 IMPLANT
CLOSURE WOUND 1/2 X4 (GAUZE/BANDAGES/DRESSINGS) ×2
CUFF TOURN SGL QUICK 34 (TOURNIQUET CUFF) ×4
CUFF TRNQT CYL 34X4X40X1 (TOURNIQUET CUFF) ×2 IMPLANT
DRAPE EXTREMITY BILATERAL (DRAPE) ×3 IMPLANT
DRAPE INCISE IOBAN 66X45 STRL (DRAPES) ×3 IMPLANT
DRAPE POUCH INSTRU U-SHP 10X18 (DRAPES) ×3 IMPLANT
DRAPE U-SHAPE 47X51 STRL (DRAPES) ×9 IMPLANT
DRSG ADAPTIC 3X8 NADH LF (GAUZE/BANDAGES/DRESSINGS) ×6 IMPLANT
DRSG PAD ABDOMINAL 8X10 ST (GAUZE/BANDAGES/DRESSINGS) IMPLANT
DURAPREP 26ML APPLICATOR (WOUND CARE) ×6 IMPLANT
ELECT REM PT RETURN 9FT ADLT (ELECTROSURGICAL) ×3
ELECTRODE REM PT RTRN 9FT ADLT (ELECTROSURGICAL) ×1 IMPLANT
EVACUATOR 1/8 PVC DRAIN (DRAIN) ×6 IMPLANT
FACESHIELD WRAPAROUND (MASK) ×12 IMPLANT
GAUZE SPONGE 4X4 12PLY STRL (GAUZE/BANDAGES/DRESSINGS) ×6 IMPLANT
GLOVE BIO SURGEON STRL SZ7.5 (GLOVE) ×6 IMPLANT
GLOVE BIO SURGEON STRL SZ8 (GLOVE) ×6 IMPLANT
GLOVE BIOGEL PI IND STRL 8 (GLOVE) ×2 IMPLANT
GLOVE BIOGEL PI INDICATOR 8 (GLOVE) ×4
GOWN STRL REUS W/TWL LRG LVL3 (GOWN DISPOSABLE) ×3 IMPLANT
GOWN STRL REUS W/TWL XL LVL3 (GOWN DISPOSABLE) ×3 IMPLANT
HANDPIECE INTERPULSE COAX TIP (DISPOSABLE) ×2
IMMOBILIZER KNEE 20 (SOFTGOODS) ×6 IMPLANT
IMMOBILIZER KNEE 20 THIGH 36 (SOFTGOODS) IMPLANT
KIT BASIN OR (CUSTOM PROCEDURE TRAY) ×3 IMPLANT
MANIFOLD NEPTUNE II (INSTRUMENTS) ×3 IMPLANT
NDL SAFETY ECLIPSE 18X1.5 (NEEDLE) ×2 IMPLANT
NEEDLE HYPO 18GX1.5 SHARP (NEEDLE) ×4
NS IRRIG 1000ML POUR BTL (IV SOLUTION) ×3 IMPLANT
PACK TOTAL JOINT (CUSTOM PROCEDURE TRAY) ×3 IMPLANT
PAD ABD 8X10 STRL (GAUZE/BANDAGES/DRESSINGS) ×6 IMPLANT
PADDING CAST COTTON 6X4 STRL (CAST SUPPLIES) ×6 IMPLANT
SET HNDPC FAN SPRY TIP SCT (DISPOSABLE) ×1 IMPLANT
SPONGE LAP 18X18 X RAY DECT (DISPOSABLE) ×3 IMPLANT
STOCKINETTE 8 INCH (MISCELLANEOUS) ×3 IMPLANT
STRIP CLOSURE SKIN 1/2X4 (GAUZE/BANDAGES/DRESSINGS) ×4 IMPLANT
SUCTION FRAZIER 12FR DISP (SUCTIONS) ×3 IMPLANT
SUT MNCRL AB 4-0 PS2 18 (SUTURE) ×6 IMPLANT
SUT VIC AB 2-0 CT1 27 (SUTURE) ×12
SUT VIC AB 2-0 CT1 TAPERPNT 27 (SUTURE) ×6 IMPLANT
SUT VLOC 180 0 24IN GS25 (SUTURE) ×6 IMPLANT
SYR 20CC LL (SYRINGE) ×6 IMPLANT
SYR 50ML LL SCALE MARK (SYRINGE) ×6 IMPLANT
TOWEL OR 17X26 10 PK STRL BLUE (TOWEL DISPOSABLE) ×6 IMPLANT
TRAY FOLEY CATH 14FRSI W/METER (CATHETERS) ×3 IMPLANT
WATER STERILE IRR 1500ML POUR (IV SOLUTION) ×3 IMPLANT
WRAP KNEE MAXI GEL POST OP (GAUZE/BANDAGES/DRESSINGS) ×6 IMPLANT

## 2015-03-07 NOTE — Anesthesia Postprocedure Evaluation (Signed)
  Anesthesia Post-op Note  Patient: Jamie Martinez  Procedure(s) Performed: Procedure(s) (LRB): BILATERAL TOTAL KNEE ARTHROPLASTY (Bilateral)  Patient Location: PACU  Anesthesia Type: GA combined with regional for post-op pain  Level of Consciousness: awake and alert   Airway and Oxygen Therapy: Patient Spontanous Breathing  Post-op Pain: mild  Post-op Assessment: Post-op Vital signs reviewed, Patient's Cardiovascular Status Stable, Respiratory Function Stable, Patent Airway and No signs of Nausea or vomiting  Last Vitals:  Filed Vitals:   03/07/15 1432  BP: 112/52  Pulse: 63  Temp: 36.7 C  Resp: 14    Post-op Vital Signs: stable   Complications: No apparent anesthesia complications

## 2015-03-07 NOTE — Anesthesia Procedure Notes (Addendum)
Epidural Patient location during procedure: holding area Start time: 03/07/2015 10:00 AM End time: 03/07/2015 10:10 AM  Staffing Anesthesiologist: Rod Mae Performed by: anesthesiologist   Preanesthetic Checklist Completed: patient identified, site marked, surgical consent, pre-op evaluation, timeout performed, IV checked, risks and benefits discussed, monitors and equipment checked and post-op pain management  Epidural Patient position: sitting Prep: Betadine Patient monitoring: heart rate, continuous pulse ox and blood pressure Approach: midline Location: L3-L4 Injection technique: LOR air  Needle:  Needle type: Hustead  Needle gauge: 18 G Needle length: 9 cm and 9 Catheter type: closed end flexible Catheter size: 20 Guage Test dose: negative and 1.5% lidocaine  Assessment Sensory level: T6  Additional Notes Test dose 1.5% Lidocaine with epi 1:200,000  Patient tolerated the insertion well without complications.Reason for block:post-op pain management  Procedure Name: Intubation Date/Time: 03/07/2015 10:45 AM Performed by: Anne Fu Pre-anesthesia Checklist: Patient identified, Emergency Drugs available, Suction available, Patient being monitored and Timeout performed Patient Re-evaluated:Patient Re-evaluated prior to inductionOxygen Delivery Method: Circle system utilized Preoxygenation: Pre-oxygenation with 100% oxygen Intubation Type: IV induction Ventilation: Mask ventilation without difficulty Laryngoscope Size: Mac and 4 Grade View: Grade I Tube type: Oral Tube size: 7.5 mm Number of attempts: 1 Airway Equipment and Method: Stylet Placement Confirmation: ETT inserted through vocal cords under direct vision,  positive ETCO2,  CO2 detector and breath sounds checked- equal and bilateral Secured at: 21 cm Tube secured with: Tape Dental Injury: Teeth and Oropharynx as per pre-operative assessment

## 2015-03-07 NOTE — Transfer of Care (Signed)
Immediate Anesthesia Transfer of Care Note  Patient: Jamie Martinez  Procedure(s) Performed: Procedure(s) with comments: BILATERAL TOTAL KNEE ARTHROPLASTY (Bilateral) - +epidural  Patient Location: PACU  Anesthesia Type:General and Regional  Level of Consciousness: awake, alert  and oriented  Airway & Oxygen Therapy: Patient Spontanous Breathing and Patient connected to face mask oxygen  Post-op Assessment: Report given to RN and Post -op Vital signs reviewed and stable  Post vital signs: Reviewed and stable  Last Vitals:  Filed Vitals:   03/07/15 1026  BP: 113/60  Pulse: 73  Temp:   Resp: 15    Complications: No apparent anesthesia complications

## 2015-03-07 NOTE — Progress Notes (Signed)
Physical medicine rehabilitation consult requested. Patient bilateral total knee arthroplasty 03/07/2015. Plan physical occupational therapy evaluation and follow-up with appropriate recommendations

## 2015-03-07 NOTE — Interval H&P Note (Signed)
History and Physical Interval Note:  03/07/2015 9:46 AM  Jamie Martinez  has presented today for surgery, with the diagnosis of BILATERAL OSTEO ARTHRITIS  The various methods of treatment have been discussed with the patient and family. After consideration of risks, benefits and other options for treatment, the patient has consented to  Procedure(s): BILATERAL TOTAL KNEE ARTHROPLASTY (Bilateral) as a surgical intervention .  The patient's history has been reviewed, patient examined, no change in status, stable for surgery.  I have reviewed the patient's chart and labs.  Questions were answered to the patient's satisfaction.     Gearlean Alf

## 2015-03-07 NOTE — Op Note (Signed)
Pre-operative diagnosis- Osteoarthritis  Bilateral knee(s)  Post-operative diagnosis- Osteoarthritis Bilateral knee(s)  Procedure-  Bilateral  Total Knee Arthroplasty  Surgeon- Dione Plover. Ibrohim Simmers, MD  Assistant- Arlee Muslim, PA-C   Anesthesia-  General and Epidural  EBL-* No blood loss amount entered *   Drains Hemovac x 1 each side  Tourniquet time-  Total Tourniquet Time Documented: Thigh (Right) - 31 minutes Total: Thigh (Right) - 31 minutes  Thigh (Left) - 33 minutes Total: Thigh (Left) - 33 minutes     Complications- None  Condition-PACU - hemodynamically stable.   Brief Clinical Note  Jamie Martinez is a 59 y.o. year old female with end stage OA of both knees with progressively worsening pain and dysfunction. He has constant pain, with activity and at rest and significant functional deficits with difficulties even with ADLs. He has had extensive non-op management including analgesics, injections of cortisone and viscosupplements, and home exercise program, but remains in significant pain with significant dysfunction. We discussed replacing both knees in the same setting versus one at a time including procedure, risks, potential complications, rehab course, and pros and cons associated with each and the patient elects to do both knees at the same time. She presents now for bilateral Total Knee Arthroplasty.     Procedure in detail---   The patient is brought into the operating room and positioned supine on the operating table. After successful administration of  General and Epidural,   a tourniquet is placed high on the  Bilateral thigh(s) and the lower extremities are prepped and draped in the usual sterile fashion. Time out is performed by the operating team and then the  Left lower extremity is wrapped in Esmarch, knee flexed and the tourniquet inflated to 300 mmHg.       A midline incision is made with a ten blade through the subcutaneous tissue to the level of the extensor  mechanism. A fresh blade is used to make a medial parapatellar arthrotomy. Soft tissue over the proximal medial tibia is subperiosteally elevated to the joint line with a knife and into the semimembranosus bursa with a Cobb elevator. Soft tissue over the proximal lateral tibia is elevated with attention being paid to avoiding the patellar tendon on the tibial tubercle. The patella is everted, knee flexed 90 degrees and the ACL and PCL are removed. Findings are bone on bone medial and patellofemoral with large global osteophytes.        The drill is used to create a starting hole in the distal femur and the canal is thoroughly irrigated with sterile saline to remove the fatty contents. The 5 degree Left  valgus alignment guide is placed into the femoral canal and the distal femoral cutting block is pinned to remove 10 mm off the distal femur. Resection is made with an oscillating saw.      The tibia is subluxed forward and the menisci are removed. The extramedullary alignment guide is placed referencing proximally at the medial aspect of the tibial tubercle and distally along the second metatarsal axis and tibial crest. The block is pinned to remove 35mm off the more deficient medial  side. Resection is made with an oscillating saw. Size 5is the most appropriate size for the tibia and the proximal tibia is prepared with the modular drill and keel punch for that size.      The femoral sizing guide is placed and size 6 is most appropriate. Rotation is marked off the epicondylar axis and confirmed by creating  a rectangular flexion gap at 90 degrees. The size 6 cutting block is pinned in this rotation and the anterior, posterior and chamfer cuts are made with the oscillating saw. The intercondylar block is then placed and that cut is made.      Trial size 5 tibial component, trial size 6 narrow posterior stabilized femur and a 6  mm posterior stabilized rotating platform insert trial is placed. Full extension is  achieved with excellent varus/valgus and anterior/posterior balance throughout full range of motion. The patella is everted and thickness measured to be 22  mm. Free hand resection is taken to 2 mm, a 35 template is placed, lug holes are drilled, trial patella is placed, and it tracks normally. Osteophytes are removed off the posterior femur with the trial in place. All trials are removed and the cut bone surfaces prepared with pulsatile lavage. Cement is mixed and once ready for implantation, the size 5 tibial implant, size  6 narrow posterior stabilized femoral component, and the size 35 patella are cemented in place and the patella is held with the clamp. The trial insert is placed and the knee held in full extension.  All extruded cement is removed and once the cement is hard the permanent 6 mm posterior stabilized rotating platform insert is placed into the tibial tray.      The wound is copiously irrigated with saline solution and the extensor mechanism closed over a hemovac drain with #1 V-loc suture. The tourniquet is released for a total tourniquet time of 33  minutes. Flexion against gravity is 140 degrees and the patella tracks normally. Subcutaneous tissue is closed with 2.0 vicryl and subcuticular with running 4.0 Monocryl.       The  Right lower extremity is wrapped in Esmarch, knee flexed and the tourniquet inflated to 300 mmHg.       A midline incision is made with a ten blade through the subcutaneous tissue to the level of the extensor mechanism. A fresh blade is used to make a medial parapatellar arthrotomy. Soft tissue over the proximal medial tibia is subperiosteally elevated to the joint line with a knife and into the semimembranosus bursa with a Cobb elevator. Soft tissue over the proximal lateral tibia is elevated with attention being paid to avoiding the patellar tendon on the tibial tubercle. The patella is everted, knee flexed 90 degrees and the ACL and PCL are removed. Findings are bone  on bone medial and patellofemoral with large global osteophytes.        The drill is used to create a starting hole in the distal femur and the canal is thoroughly irrigated with sterile saline to remove the fatty contents. The 5 degree Right  valgus alignment guide is placed into the femoral canal and the distal femoral cutting block is pinned to remove 10 mm off the distal femur. Resection is made with an oscillating saw.      The tibia is subluxed forward and the menisci are removed. The extramedullary alignment guide is placed referencing proximally at the medial aspect of the tibial tubercle and distally along the second metatarsal axis and tibial crest. The block is pinned to remove 41mm off the more deficient medial  side. Resection is made with an oscillating saw. Size 5is the most appropriate size for the tibia and the proximal tibia is prepared with the modular drill and keel punch for that size.      The femoral sizing guide is placed and size 6 is most  appropriate. Rotation is marked off the epicondylar axis and confirmed by creating a rectangular flexion gap at 90 degrees. The size 6 cutting block is pinned in this rotation and the anterior, posterior and chamfer cuts are made with the oscillating saw. The intercondylar block is then placed and that cut is made.      Trial size 5 tibial component, trial size 6 posterior stabilized femur and a 6  mm posterior stabilized rotating platform insert trial is placed. Full extension is achieved with excellent varus/valgus and anterior/posterior balance throughout full range of motion. The patella is everted and thickness measured to be 22  mm. Free hand resection is taken to 12 mm, a 35 template is placed, lug holes are drilled, trial patella is placed, and it tracks normally. Osteophytes are removed off the posterior femur with the trial in place. All trials are removed and the cut bone surfaces prepared with pulsatile lavage. Cement is mixed and once ready  for implantation, the size 5 tibial implant, size  6 narrow posterior stabilized femoral component, and the size 35 patella are cemented in place and the patella is held with the clamp. The trial insert is placed and the knee held in full extension.   All extruded cement is removed and once the cement is hard the permanent 6 mm posterior stabilized rotating platform insert is placed into the tibial tray.      The wound is copiously irrigated with saline solution and the extensor mechanism closed over a hemovac drain with #1 V-loc suture. The tourniquet is released for a total tourniquet time of 31  minutes. Flexion against gravity is 135 degrees and the patella tracks normally. Subcutaneous tissue is closed with 2.0 vicryl and subcuticular with running 4.0 Monocryl. The incisions are cleaned and dried and steri-strips and  bulky sterile dressings are applied. The limbs are placed into knee immobilizers and the patient is awakened and transported to recovery in stable condition.      Please note that a surgical assistant was a medical necessity for this procedure in order to perform it in a safe and expeditious manner. Surgical assistant was necessary to retract the ligaments and vital neurovascular structures to prevent injury to them and also necessary for proper positioning of the limb to allow for anatomic placement of the prosthesis.   Dione Plover Jamie Vanallen, MD    03/07/2015, 1:00 PM

## 2015-03-07 NOTE — Progress Notes (Signed)
AssistedDr. Parke Poisson with epidural catheter placement. Side rails up, monitors on throughout procedure. See vital signs in flow sheet. Tolerated Procedure well.

## 2015-03-07 NOTE — Progress Notes (Signed)
ANTICOAGULATION CONSULT NOTE - Initial Consult  Pharmacy Consult for Warfarin Indication: VTE prophylaxis  No Known Allergies  Patient Measurements: Height: 5\' 8"  (172.7 cm) Weight: 205 lb (92.987 kg) IBW/kg (Calculated) : 63.9  Vital Signs: Temp: 98 F (36.7 C) (03/16 1432) Temp Source: Oral (03/16 0838) BP: 112/52 mmHg (03/16 1432) Pulse Rate: 63 (03/16 1432)  Labs: No results for input(s): HGB, HCT, PLT, APTT, LABPROT, INR, HEPARINUNFRC, CREATININE, CKTOTAL, CKMB, TROPONINI in the last 72 hours.  Estimated Creatinine Clearance: 92.5 mL/min (by C-G formula based on Cr of 0.56).   Medical History: Past Medical History  Diagnosis Date  . Hypothyroidism   . Arthritis   . Cancer 2010    left breast surgery and radiation ,  dec 2013 colon chemo done for colon    Medications:  Scheduled:  . acetaminophen  1,000 mg Oral 4 times per day  .  ceFAZolin (ANCEF) IV  2 g Intravenous Q6H  . [START ON 03/08/2015] dexamethasone  10 mg Intravenous Once  . docusate sodium  100 mg Oral BID  . letrozole  2.5 mg Oral Daily  . [START ON 03/08/2015] levothyroxine  300 mcg Oral QPM   Infusions:  . sodium chloride    . ropivacaine (PF) 2 mg/ml (0.2%) 12 mL/hr (03/07/15 1327)   PRN: [START ON 03/08/2015] acetaminophen **OR** [START ON 03/08/2015] acetaminophen, bisacodyl, diphenhydrAMINE, menthol-cetylpyridinium **OR** phenol, methocarbamol **OR** methocarbamol (ROBAXIN)  IV, metoCLOPramide **OR** metoCLOPramide (REGLAN) injection, morphine injection, ondansetron **OR** ondansetron (ZOFRAN) IV, oxyCODONE, polyethylene glycol, sodium phosphate, traMADol  Assessment: 43 yoF admitted 3/16 for bilateral total knee replacements.  PMH includes colon and breast cancer, hypothyroidism, hemorrhoids, varicose veins, and OA.  She had an epidural placed 3/16 at 1000.  Pharmacy is consulted to dose warfarin for VTE prophylaxis post-op to begin on POD #1, 03/08/15.   CBC: Hgb 12.1 and Plt 206 (pre-op on  3/10)  INR: 0.96 (pre-op on 3/10)  Drug-drug interactions: none noted   Goal of Therapy:  INR 2-3 Monitor platelets by anticoagulation protocol: Yes   Plan:   NO warfarin today.  Daily INR.  Gretta Arab PharmD, BCPS Pager [phone] 03/07/2015 2:47 PM

## 2015-03-08 DIAGNOSIS — Z96653 Presence of artificial knee joint, bilateral: Secondary | ICD-10-CM

## 2015-03-08 DIAGNOSIS — M170 Bilateral primary osteoarthritis of knee: Principal | ICD-10-CM

## 2015-03-08 LAB — BASIC METABOLIC PANEL
Anion gap: 9 (ref 5–15)
BUN: 9 mg/dL (ref 6–23)
CALCIUM: 8.5 mg/dL (ref 8.4–10.5)
CO2: 26 mmol/L (ref 19–32)
CREATININE: 0.44 mg/dL — AB (ref 0.50–1.10)
Chloride: 105 mmol/L (ref 96–112)
GFR calc non Af Amer: >90 mL/min (ref >90)
Glucose, Bld: 145 mg/dL — ABNORMAL HIGH (ref 70–99)
Potassium: 4 mmol/L (ref 3.5–5.1)
SODIUM: 140 mmol/L (ref 135–145)

## 2015-03-08 LAB — CBC
HCT: 31.6 % — ABNORMAL LOW (ref 36.0–46.0)
Hemoglobin: 10.5 g/dL — ABNORMAL LOW (ref 12.0–15.0)
MCH: 30.5 pg (ref 26.0–34.0)
MCHC: 33.2 g/dL (ref 30.0–36.0)
MCV: 91.9 fL (ref 78.0–100.0)
PLATELETS: 160 10*3/uL (ref 150–400)
RBC: 3.44 MIL/uL — ABNORMAL LOW (ref 3.87–5.11)
RDW: 13.3 % (ref 11.5–15.5)
WBC: 8.6 10*3/uL (ref 4.0–10.5)

## 2015-03-08 LAB — PROTIME-INR
INR: 1.11 (ref 0.00–1.49)
Prothrombin Time: 14.4 seconds (ref 11.6–15.2)

## 2015-03-08 MED ORDER — COUMADIN BOOK
Freq: Once | Status: AC
  Administered 2015-03-08: 10:00:00
  Filled 2015-03-08: qty 1

## 2015-03-08 MED ORDER — WARFARIN VIDEO
Freq: Once | Status: AC
  Administered 2015-03-08: 1

## 2015-03-08 MED ORDER — WARFARIN SODIUM 4 MG PO TABS
4.0000 mg | ORAL_TABLET | Freq: Once | ORAL | Status: AC
  Administered 2015-03-08: 4 mg via ORAL
  Filled 2015-03-08: qty 1

## 2015-03-08 NOTE — Consult Note (Signed)
Physical Medicine and Rehabilitation Consult Reason for Consult: Bilateral total knee arthroplasty secondary to end-stage osteoarthritis Referring Physician: Dr. Maureen Ralphs   HPI: Jamie Martinez is a 59 y.o. right handed female with history of breast and colon cancer as well as remote tobacco abuse. Patient presented with end-stage osteoarthritis bilateral knees 10 years and failed nonsurgical conservative treatments including NSAIDS, corticosteroid injections and activity modification. Independent prior to admission living with her husband. Patient admitted 03/07/2015 undergoing elective bilateral total knee arthroplasty per Dr. Maureen Ralphs. Postoperative pain management. Weightbearing as tolerated. Placed on Coumadin for DVT prophylaxis 4 weeks total then begin aspirin 81 mg daily. Acute blood loss anemia 10.5 and monitored. Physical therapy evaluation completed 03/08/2015 with recommendations of physical medicine rehabilitation consult.  Patient without breathing issues. Had some pain overnight. Still has epidural catheter in. Scheduled to have this out today. Both legs numb, Foley catheter in Review of Systems  Gastrointestinal: Positive for constipation.  Musculoskeletal: Positive for myalgias and joint pain.  All other systems reviewed and are negative.  Past Medical History  Diagnosis Date  . Hypothyroidism   . Arthritis   . Cancer 2010    left breast surgery and radiation ,  dec 2013 colon chemo done for colon   Past Surgical History  Procedure Laterality Date  . Breast surgery Left 2010    lumpectomy done, lymph nodes removed   . Abdominal hysterectomy      partial  . Colon surgery  dec 2013    surgery for colon cancer, part of bladder, part of pelvic wall removed appendix removed 1 ovary removed   . Appendectomy  dec 2013  . Knee arthroscopy Bilateral   . Total knee arthroplasty Bilateral 03/07/2015    Procedure: BILATERAL TOTAL KNEE ARTHROPLASTY;  Surgeon: Gaynelle Arabian,  MD;  Location: [facility];  Service: Orthopedics;  Laterality: Bilateral;  +epidural   History reviewed. No pertinent family history. Social History:  reports that she has never smoked. She has never used smokeless tobacco. She reports that she does not drink alcohol or use illicit drugs. Allergies: No Known Allergies Medications Prior to Admission  Medication Sig Dispense Refill  . acetaminophen (TYLENOL) 500 MG tablet Take 500 mg by mouth every 6 (six) hours as needed (Pain).    Marland Kitchen HYDROcodone-acetaminophen (NORCO/VICODIN) 5-325 MG per tablet Take 1-2 tablets by mouth every 6 (six) hours as needed for moderate pain.    Marland Kitchen letrozole (FEMARA) 2.5 MG tablet Take 2.5 mg by mouth daily.    Marland Kitchen levothyroxine (SYNTHROID, LEVOTHROID) 300 MCG tablet Take 300 mcg by mouth every evening.       Home: Home Living Family/patient expects to be discharged to:: Private residence Living Arrangements: Spouse/significant other  Functional History:   Functional Status:  Mobility:   Bed Mobility Overal bed mobility: Needs Assistance;+2 for physical assistance;+ 2 for safety/equipment Bed Mobility: Supine to Sit;Sit to Supine     Supine to sit: Mod assist;+2 for physical assistance;+2 for safety/equipment Sit to supine: Mod assist;+2 for physical assistance;+2 for safety/equipment   General bed mobility comments: cues for sequence and physical assist to manage LEs and to control trunk  Transfers Overall transfer level: Needs assistance Equipment used: Rolling walker (2 wheeled) Transfers: Sit to/from Stand Sit to Stand: +2 physical assistance;+2 safety/equipment;From elevated surface;Mod assist         General transfer comment: cues for LE management and use of UEs to self assist  Ambulation/Gait Ambulation/Gait assistance: Mod assist;+2 physical assistance;+2 safety/equipment  Ambulation Distance (Feet): 2 Feet Assistive device: Rolling walker (2 wheeled) Gait Pattern/deviations: Step-to  pattern;Decreased step length - right;Decreased step length - left;Shuffle;Trunk flexed     General Gait Details: 2 steps only to align with bedside - cues for sequence and assist for support/balance and to advance L LE  Stairs            Wheelchair Mobility        ADL:    Cognition: Cognition Orientation Level: Oriented X4    Blood pressure 97/57, pulse 82, temperature 98.1 F (36.7 C), temperature source Oral, resp. rate 16, height 5\' 8"  (1.727 m), weight 92.987 kg (205 lb), SpO2 97 %. Physical Exam  Constitutional: She appears well-developed.  HENT:  Head: Normocephalic.  Eyes: EOM are normal.  Neck: Normal range of motion. Neck supple. No thyromegaly present.  Cardiovascular: Normal rate and regular rhythm.   Respiratory: Effort normal and breath sounds normal. No respiratory distress.  GI: Soft. Bowel sounds are normal. She exhibits no distension.  Neurological:  Patient is a bit lethargic but arousable. She is oriented 3. Follows commands.  Skin:  Bilateral knee incisions are dressed appropriately tender  Motor strength is 5/5 bilateral deltoids, biceps, triceps, grip 2 minus bilateral hip flexors and knee extensors 3 minus ankle dorsiflexors Sensation paresthesias bilateral feet to light touch  Results for orders placed or performed during the hospital encounter of 03/07/15 (from the past 24 hour(s))  ABO/Rh     Status: None   Collection Time: 03/07/15  9:00 AM  Result Value Ref Range   ABO/RH(D) A POS   Type and screen     Status: None   Collection Time: 03/07/15  9:10 AM  Result Value Ref Range   ABO/RH(D) A POS    Antibody Screen NEG    Sample Expiration 03/10/2015   CBC     Status: Abnormal   Collection Time: 03/08/15  5:05 AM  Result Value Ref Range   WBC 8.6 4.0 - 10.5 K/uL   RBC 3.44 (L) 3.87 - 5.11 MIL/uL   Hemoglobin 10.5 (L) 12.0 - 15.0 g/dL   HCT 31.6 (L) 36.0 - 46.0 %   MCV 91.9 78.0 - 100.0 fL   MCH 30.5 26.0 - 34.0 pg   MCHC 33.2  30.0 - 36.0 g/dL   RDW 13.3 11.5 - 15.5 %   Platelets 160 150 - 400 K/uL  Protime-INR     Status: None   Collection Time: 03/08/15  5:05 AM  Result Value Ref Range   Prothrombin Time 14.4 11.6 - 15.2 seconds   INR 1.11 0.00 - 1.49   No results found.  Assessment/Plan: Diagnosis: End-stage osteoarthritis of the knees status post bilateral TKR postoperative day #2 1. Does the need for close, 24 hr/day medical supervision in concert with the patient's rehab needs make it unreasonable for this patient to be served in a less intensive setting? Yes 2. Co-Morbidities requiring supervision/potential complications: Postoperative pain, postoperative bowel and bladder issues, anticoagulation 3. Due to bladder management, bowel management, safety, skin/wound care, disease management, medication administration, pain management and patient education, does the patient require 24 hr/day rehab nursing? Yes 4. Does the patient require coordinated care of a physician, rehab nurse, PT (1-2 hrs/day, 5 days/week) and OT (1-2 hrs/day, 5 days/week) to address physical and functional deficits in the context of the above medical diagnosis(es)? Yes Addressing deficits in the following areas: balance, endurance, locomotion, strength, transferring, bathing, dressing, grooming and toileting 5. Can the patient  actively participate in an intensive therapy program of at least 3 hrs of therapy per day at least 5 days per week? Yes 6. The potential for patient to make measurable gains while on inpatient rehab is excellent 7. Anticipated functional outcomes upon discharge from inpatient rehab are modified independent  with PT, modified independent with OT, n/a with SLP. 8. Estimated rehab length of stay to reach the above functional goals is: 7 days 9. Does the patient have adequate social supports and living environment to accommodate these discharge functional goals? Yes 10. Anticipated D/C setting: Home 11. Anticipated post  D/C treatments: [place] therapy 12. Overall Rehab/Functional Prognosis: excellent  RECOMMENDATIONS: This patient's condition is appropriate for continued rehabilitative care in the following setting: CIR Patient has agreed to participate in recommended program. Yes Note that insurance prior authorization may be required for reimbursement for recommended care.  Comment: Need to see how patient does up with PT after epidural catheter removed    03/08/2015

## 2015-03-08 NOTE — PMR Pre-admission (Addendum)
PMR Admission Coordinator Pre-Admission Assessment  Patient: Jamie Martinez is an 59 y.o., female MRN: [id number] DOB: 12/18/1956 Height: _0  (172.7 cm) Weight: 92.987 kg (205 lb)              Insurance Information HMO:     PPO:       PCP:       IPA:       80/20:       OTHER: Group # [id number] PRIMARY:  [employer]      Policy#: [id number]      Subscriber: Ethelene Browns CM Name: Glynis Smiles.      Phone#: [phone]     Fax#:  [phone] Pre-Cert#: Juel Burrow   Received approval from Glynis Smiles on 03/12/15 for admission this date for 7 days with update due on 03/19/15    Employer: [employer] Benefits:  Phone #:  [phone]     Name: Automated Eff. Date: 10/04/13     Deduct: $500 (met all)      Out of Pocket Max: $3000 (Has met $340.14)     Life Max: unlimited CIR: 80%      SNF: 80% Outpatient: 80%     Co-Pay: 20% Home Health: 80%      Co-Pay: 20% DME: 80%     Co-Pay: 20% Providers: in Therapist, art Information    Name Relation Home Work [place] [phone]  [phone]     Current Medical History  Patient Admitting Diagnosis:  B TKR  History of Present Illness:  A 59 y.o. right handed female with history of breast and colon cancer as well as remote tobacco abuse. Patient presented with end-stage osteoarthritis bilateral knees 10 years and failed nonsurgical conservative treatments including NSAIDS, corticosteroid injections and activity modification. Independent prior to admission living with her husband. Patient admitted 03/07/2015 undergoing elective bilateral total knee arthroplasty per Dr. Maureen Ralphs. Postoperative pain management with epidural catheter removed 03/09/2015. Weightbearing as tolerated.  Placed on Coumadin for DVT prophylaxis 4 weeks total then begin aspirin 81 mg daily. Acute blood loss anemia 10.5 and monitored. Physical therapy evaluation completed 03/08/2015 and OT evaluation completed 03/09/15 with recommendations of physical  medicine rehabilitation consult. Pt. Underwent transfusion of 2 units PRBCs on 03/10/15.   Patient  admitted for comprehensive inpatient rehabilitation program on 03/12/15.  Past Medical History  Past Medical History  Diagnosis Date  . Hypothyroidism   . Arthritis   . Cancer 2010    left breast surgery and radiation ,  dec 2013 colon chemo done for colon    Family History  family history is not on file.  Prior Rehab/Hospitalizations:  No previous rehab.   Current Medications   Current facility-administered medications:  .  0.9 %  sodium chloride infusion, , Intravenous, Continuous, Arlee Muslim, PA-C, Last Rate: 50 mL/hr at 03/08/15 1024 .  acetaminophen (TYLENOL) tablet 650 mg, 650 mg, Oral, Q6H PRN, 650 mg at 03/09/15 5883 **OR** acetaminophen (TYLENOL) suppository 650 mg, 650 mg, Rectal, Q6H PRN, Gaynelle Arabian, MD .  bisacodyl (DULCOLAX) suppository 10 mg, 10 mg, Rectal, Daily PRN, Gaynelle Arabian, MD .  diphenhydrAMINE (BENADRYL) 12.5 MG/5ML elixir 12.5-25 mg, 12.5-25 mg, Oral, Q4H PRN, Gaynelle Arabian, MD .  docusate sodium (COLACE) capsule 100 mg, 100 mg, Oral, BID, Gaynelle Arabian, MD, 100 mg at 03/09/15 2549 .  iron polysaccharides (NIFEREX) capsule 150 mg, 150 mg, Oral, Daily, Arlee Muslim, PA-C, 150 mg at 03/09/15 8264 .  letrozole [facility])  tablet 2.5 mg, 2.5 mg, Oral, Daily, Gaynelle Arabian, MD, 2.5 mg at 03/09/15 3267 .  levothyroxine (SYNTHROID, LEVOTHROID) tablet 300 mcg, 300 mcg, Oral, QPM, Gaynelle Arabian, MD, 300 mcg at 03/08/15 1748 .  menthol-cetylpyridinium (CEPACOL) lozenge 3 mg, 1 lozenge, Oral, PRN **OR** phenol (CHLORASEPTIC) mouth spray 1 spray, 1 spray, Mouth/Throat, PRN, Gaynelle Arabian, MD .  methocarbamol (ROBAXIN) tablet 500 mg, 500 mg, Oral, Q6H PRN, 500 mg at 03/09/15 1236 **OR** methocarbamol (ROBAXIN) 500 mg in dextrose 5 % 50 mL IVPB, 500 mg, Intravenous, Q6H PRN, Gaynelle Arabian, MD, 500 mg at 03/07/15 1328 .  metoCLOPramide (REGLAN) tablet 5-10 mg, 5-10 mg, Oral, Q8H  PRN **OR** metoCLOPramide (REGLAN) injection 5-10 mg, 5-10 mg, Intravenous, Q8H PRN, Gaynelle Arabian, MD .  morphine 2 MG/ML injection 1-2 mg, 1-2 mg, Intravenous, Q1H PRN, Gaynelle Arabian, MD, 2 mg at 03/09/15 0520 .  ondansetron (ZOFRAN) tablet 4 mg, 4 mg, Oral, Q6H PRN **OR** ondansetron (ZOFRAN) injection 4 mg, 4 mg, Intravenous, Q6H PRN, Gaynelle Arabian, MD .  oxyCODONE (Oxy IR/ROXICODONE) immediate release tablet 5-15 mg, 5-15 mg, Oral, Q3H PRN, Gaynelle Arabian, MD, 15 mg at 03/09/15 1236 .  polyethylene glycol (MIRALAX / GLYCOLAX) packet 17 g, 17 g, Oral, Daily PRN, Gaynelle Arabian, MD .  ropivacaine (PF) 2 mg/ml (0.2%) (NAROPIN) epidural, 12 mL/hr, Epidural, Continuous, Rod Mae, MD, Last Rate: 12 mL/hr at 03/09/15 0844, 12 mL/hr at 03/09/15 0844 .  traMADol (ULTRAM) tablet 50-100 mg, 50-100 mg, Oral, Q6H PRN, Gaynelle Arabian, MD .  Warfarin - Pharmacist Dosing Inpatient, , Does not apply, q1800, Randa Spike, RPH  Patients Current Diet: Diet regular  Precautions / Restrictions Precautions Precautions: Knee, Fall Restrictions Weight Bearing Restrictions: No Other Position/Activity Restrictions: WBAT   Prior Activity Level Community (5-7x/wk): Went out daily.  Worked [employer] in [employer].   Home Assistive Devices / Equipment Home Assistive Devices/Equipment: Eyeglasses, Dentures (specify type)  Prior Functional Level Prior Function Level of Independence: Independent  Current Functional Level Cognition  Overall Cognitive Status: Within Functional Limits for tasks assessed Orientation Level: Oriented X4    Extremity Assessment (includes Sensation/Coordination)  Upper Extremity Assessment: Overall WFL for tasks assessed  Lower Extremity Assessment: RLE deficits/detail, LLE deficits/detail RLE Deficits / Details: 3-/5 quads; AAROM at knee -10 - 40 LLE Deficits / Details: 2-/5 quads with AAROM at knee -10- 50; ltd sensation 2* epidural    ADLs  Overall ADL's : Needs  assistance/impaired Eating/Feeding: Independent, Sitting Grooming: Wash/dry hands, Wash/dry face, Set up, Sitting Upper Body Bathing: Set up, Supervision/ safety, Sitting Lower Body Bathing: +2 for safety/equipment, +2 for physical assistance, Maximal assistance, Sit to/from stand Upper Body Dressing : Minimal assistance, Sitting Upper Body Dressing Details (indicate cue type and reason): with gown due to lines Lower Body Dressing: +2 for safety/equipment, +2 for physical assistance, Total assistance, Sit to/from stand Lower Body Dressing Details (indicate cue type and reason): attempted to reach down to straighten R sock but unable to get far enough to top of sock to adjust it herself.  Toilet Transfer: +2 for physical assistance, +2 for safety/equipment, Moderate assistance, RW Toilet Transfer Details (indicate cue type and reason): sit to stand and take several steps (almost to doorway) Toileting- Water quality scientist and Hygiene: +2 for safety/equipment, +2 for physical assistance, Total assistance, Sit to/from stand Toileting - Clothing Manipulation Details (indicate cue type and reason): +2 mod assist to stand and total assist due to pt unable to free UEs from walker to manage clothing,  etc. General ADL Comments: Educated on AE options for LB self care and coverage. Pt began to feel a little lightheaded once she approached doorway with walker and therapist assisting so chair pulled up and assisted pt to sit.     Mobility  Overal bed mobility: Needs Assistance, +2 for physical assistance, + 2 for safety/equipment Bed Mobility: Supine to Sit Supine to sit: Mod assist, +2 for physical assistance, +2 for safety/equipment Sit to supine: Mod assist, +2 for physical assistance, +2 for safety/equipment General bed mobility comments: asisst for LEs, verbal cues for sequence    Transfers  Overall transfer level: Needs assistance Equipment used: Rolling walker (2 wheeled) Transfers: Sit to/from  Stand Sit to Stand: +2 physical assistance, +2 safety/equipment, Mod assist General transfer comment: assist to rise and steady, cues for hand placement and LE management. Assist to descend to chair and extend LEs out in front.    Ambulation / Gait / Stairs / Wheelchair Mobility  Ambulation/Gait Ambulation/Gait assistance: Mod assist, +2 physical assistance, +2 safety/equipment Ambulation Distance (Feet): 7 Feet Assistive device: Rolling walker (2 wheeled) Gait Pattern/deviations: Decreased step length - right, Decreased step length - left, Step-to pattern, Shuffle, Trunk flexed Gait velocity: decr General Gait Details: cues for posture, position from RW and sequence    Posture / Balance      Special needs/care consideration BiPAP/CPAP No CPM Yes, Bilateral CPMs on acute Continuous Drip IV 0.9% NS 50 ml/hr Dialysis No     Life Vest No Oxygen Yes, on acute  Special Bed No Trach Size No Wound Vac (area) No      Skin B TKR incisions                            Bowel mgmt: Last BM 03/07/15 Bladder mgmt: Voiding without incontinence Diabetic mgmt No    Previous Home Environment Living Arrangements: Spouse/significant other Home Care Services: No  Discharge Living Setting Plans for Discharge Living Setting: Patient's home, House, Lives with (comment) (Lives with husband and her 25 yo son.) Type of Home at Discharge: House Discharge Home Layout: Two level, Able to live on main level with bedroom/bathroom Alternate Level Stairs-Number of Steps: Flight Discharge Home Access: Stairs to enter [employer] of Steps: 2 at front and 5 at back entrance.  Usually enters at back entrance. Does the patient have any problems obtaining your medications?: No  Social/Family/Support Systems Patient Roles: Spouse, Parent (Has a husband and a 47 yo son.) Contact Information: Akylah Hascall - spouse Anticipated Caregiver: husband, son and self Anticipated Caregiver's Contact Information:  Shanon Brow - spouse (h) [phone] (c) [phone] Ability/Limitations of Caregiver: Husband works 3rd shift, son works 1st shift. Discharge Plan Discussed with Primary Caregiver: Yes Is Caregiver In Agreement with Plan?: Yes Does Caregiver/Family have Issues with Lodging/Transportation while Pt is in Rehab?: No  Goals/Additional Needs Patient/Family Goal for Rehab: PT/OT mod I goals Expected length of stay: 7 days Cultural Considerations: None Dietary Needs: Regular diet, thin liquids Equipment Needs: TBD Pt/Family Agrees to Admission and willing to participate: Yes Program Orientation Provided & Reviewed with Pt/Caregiver Including Roles  & Responsibilities: Yes  Decrease burden of Care through IP rehab admission: N/A  Possible need for SNF placement upon discharge: Not planned  Patient Condition: This patient's medical and functional status has changed since the consult dated: 03/09/15 in which the Rehabilitation Physician determined and documented that the patient's condition is appropriate for intensive rehabilitative care in an  inpatient rehabilitation facility. See "History of Present Illness" (above) for medical update. Functional changes are: Currently requiring mod assist +2 to ambulate 7 ft. RW. Patient's medical and functional status update has been discussed with the Rehabilitation physician and patient remains appropriate for inpatient rehabilitation. Admitted 03/12/15 to inpatient rehab.   Preadmission Screen Completed By:  Retta Diones, 03/09/2015 1:49 PM  And Gerlean Ren 03/12/15 at 1246. ______________________________________________________________________   Discussed status with Dr. Naaman Plummer on 03/12/15 at 1247 and received telephone approval for admission tomorrow, Saturday.  Admission Coordinator:  Retta Diones, time1349/Date03/18/16   Gerlean Ren,  03/12/15 1246

## 2015-03-08 NOTE — Progress Notes (Signed)
ANTICOAGULATION CONSULT NOTE - Follow Up  Pharmacy Consult for Warfarin Indication: VTE prophylaxis  No Known Allergies  Patient Measurements: Height: 5\' 8"  (172.7 cm) Weight: 205 lb (92.987 kg) IBW/kg (Calculated) : 63.9  Vital Signs: Temp: 98.1 F (36.7 C) (03/17 0536) Temp Source: Oral (03/17 0536) BP: 97/57 mmHg (03/17 0536) Pulse Rate: 82 (03/17 0536)  Labs:  Recent Labs  03/08/15 0505  HGB 10.5*  HCT 31.6*  PLT 160  LABPROT 14.4  INR 1.11  CREATININE 0.44*    Estimated Creatinine Clearance: 92.5 mL/min (by C-G formula based on Cr of 0.44).   Medical History: Past Medical History  Diagnosis Date  . Hypothyroidism   . Arthritis   . Cancer 2010    left breast surgery and radiation ,  dec 2013 colon chemo done for colon    Medications:  Scheduled:  . acetaminophen  1,000 mg Oral 4 times per day  . dexamethasone  10 mg Intravenous Once  . docusate sodium  100 mg Oral BID  . letrozole  2.5 mg Oral Daily  . levothyroxine  300 mcg Oral QPM  . Warfarin - Pharmacist Dosing Inpatient   Does not apply q1800   Infusions:  . sodium chloride 75 mL/hr at 03/07/15 2049  . ropivacaine (PF) 2 mg/ml (0.2%) 12 mL/hr (03/08/15 0039)   PRN: acetaminophen **OR** acetaminophen, bisacodyl, diphenhydrAMINE, menthol-cetylpyridinium **OR** phenol, methocarbamol **OR** methocarbamol (ROBAXIN)  IV, metoCLOPramide **OR** metoCLOPramide (REGLAN) injection, morphine injection, ondansetron **OR** ondansetron (ZOFRAN) IV, oxyCODONE, polyethylene glycol, traMADol  Assessment: 77 yoF admitted 3/16 for bilateral total knee replacements.  PMH includes colon and breast cancer, hypothyroidism, hemorrhoids, varicose veins, and OA.  She had an epidural placed 3/16 at 1000.  Pharmacy is consulted to dose warfarin for VTE prophylaxis post-op to begin on POD #1, 03/08/15.   CBC w/ Hgb moderately low as expected post-op, plt wnl  INR 1.11; no PTA anticoagulation  Drug-drug interactions: none  noted  No bleeding noted per nursing  Eating 50-75% of meals   Goal of Therapy:  INR 2-3 Monitor platelets by anticoagulation protocol: Yes   Plan:   Warfarin 4 mg today at 1000  No further warfarin while epidural in place  Daily INR; CBC at least q72 while on warfarin  Monitor for signs of bleeding  Reuel Boom, PharmD Pager: [phone] 03/08/2015, 9:05 AM

## 2015-03-08 NOTE — Evaluation (Signed)
Physical Therapy Evaluation Patient Details Name: Jamie Martinez MRN: [id number] DOB: November 01, 1956 Today's Date: 03/08/2015   History of Present Illness  Bil TKR  Clinical Impression  Pt s/p Bil TKRs presents with decreased Bil LE strength/ROM and post op pain limiting functional mobility.  Pt would greatly benefit from follow up rehab at CIR level to maximize IND and safety prior to return home.    Follow Up Recommendations CIR    Equipment Recommendations  None recommended by PT    Recommendations for Other Services OT consult     Precautions / Restrictions Precautions Precautions: Knee;Fall Required Braces or Orthoses: Knee Immobilizer - Right;Knee Immobilizer - Left Knee Immobilizer - Right: Discontinue once straight leg raise with < 10 degree lag Knee Immobilizer - Left: Discontinue once straight leg raise with < 10 degree lag Restrictions Weight Bearing Restrictions: No Other Position/Activity Restrictions: WBAT      Mobility  Bed Mobility Overal bed mobility: Needs Assistance;+2 for physical assistance;+ 2 for safety/equipment Bed Mobility: Supine to Sit;Sit to Supine     Supine to sit: Mod assist;+2 for physical assistance;+2 for safety/equipment Sit to supine: Mod assist;+2 for physical assistance;+2 for safety/equipment   General bed mobility comments: cues for sequence and physical assist to manage LEs and to control trunk  Transfers Overall transfer level: Needs assistance Equipment used: Rolling walker (2 wheeled) Transfers: Sit to/from Stand Sit to Stand: +2 physical assistance;+2 safety/equipment;From elevated surface;Mod assist         General transfer comment: cues for LE management and use of UEs to self assist  Ambulation/Gait Ambulation/Gait assistance: Mod assist;+2 physical assistance;+2 safety/equipment Ambulation Distance (Feet): 2 Feet Assistive device: Rolling walker (2 wheeled) Gait Pattern/deviations: Step-to pattern;Decreased step  length - right;Decreased step length - left;Shuffle;Trunk flexed     General Gait Details: 2 steps only to align with bedside - cues for sequence and assist for support/balance and to advance L LE  Stairs            Wheelchair Mobility    Modified Rankin (Stroke Patients Only)       Balance                                             Pertinent Vitals/Pain Pain Assessment: 0-10 Pain Score: 3  Pain Location: R knee Pain Descriptors / Indicators: Sore Pain Intervention(s): Limited activity within patient's tolerance;Monitored during session;Premedicated before session;Ice applied    Home Living Family/patient expects to be discharged to:: Inpatient rehab                      Prior Function Level of Independence: Independent               Hand Dominance   Dominant Hand: Right    Extremity/Trunk Assessment   Upper Extremity Assessment: Overall WFL for tasks assessed           Lower Extremity Assessment: RLE deficits/detail;LLE deficits/detail RLE Deficits / Details: 3-/5 quads; AAROM at knee -10 - 40 LLE Deficits / Details: 2-/5 quads with AAROM at knee -10- 50; ltd sensation 2* epidural  Cervical / Trunk Assessment: Normal  Communication   Communication: No difficulties  Cognition Arousal/Alertness: Awake/alert Behavior During Therapy: WFL for tasks assessed/performed Overall Cognitive Status: Within Functional Limits for tasks assessed  General Comments      Exercises Total Joint Exercises Ankle Circles/Pumps: AROM;Both;15 reps;Supine Quad Sets: AROM;Both;10 reps;Supine Heel Slides: AAROM;Both;10 reps;Supine Straight Leg Raises: AAROM;Both;10 reps;Supine      Assessment/Plan    PT Assessment Patient needs continued PT services  PT Diagnosis Difficulty walking   PT Problem List Decreased strength;Decreased range of motion;Decreased activity tolerance;Decreased mobility;Decreased  balance;Decreased knowledge of use of DME;Pain  PT Treatment Interventions DME instruction;Gait training;Functional mobility training;Therapeutic activities;Therapeutic exercise;Patient/family education   PT Goals (Current goals can be found in the Care Plan section) Acute Rehab PT Goals Patient Stated Goal: Rehab and home to walk with less pain PT Goal Formulation: With patient Time For Goal Achievement: 03/15/15 Potential to Achieve Goals: Good    Frequency 7X/week   Barriers to discharge        Co-evaluation               End of Session Equipment Utilized During Treatment: Gait belt;Right knee immobilizer;Left knee immobilizer Activity Tolerance: Patient tolerated treatment well;Patient limited by fatigue;Other (comment) (dizzy with standing) Patient left: in bed;with call bell/phone within reach;with family/visitor present Nurse Communication: Mobility status         Time: 0370-4888 PT Time Calculation (min) (ACUTE ONLY): 38 min   Charges:   PT Evaluation $Initial PT Evaluation Tier I: 1 Procedure PT Treatments $Therapeutic Exercise: 8-22 mins $Therapeutic Activity: 8-22 mins   PT G Codes:        Aviendha Azbell Mar 31, 2015, 12:32 PM

## 2015-03-08 NOTE — Discharge Instructions (Addendum)
° °Dr. Frank Aluisio °Total Joint Specialist °[facility] °[address] °[place], [place] [address] °[phone] ° °TOTAL KNEE REPLACEMENT POSTOPERATIVE DIRECTIONS ° ° ° °Knee Rehabilitation, Guidelines Following Surgery  °Results after knee surgery are often greatly improved when you follow the exercise, range of motion and muscle strengthening exercises prescribed by your doctor. Safety measures are also important to protect the knee from further injury. Any time any of these exercises cause you to have increased pain or swelling in your knee joint, decrease the amount until you are comfortable again and slowly increase them. If you have problems or questions, call your caregiver or physical therapist for advice.  ° °HOME CARE INSTRUCTIONS  °Remove items at home which could result in a fall. This includes throw rugs or furniture in walking pathways.  °Continue medications as instructed at time of discharge. °You may have some home medications which will be placed on hold until you complete the course of blood thinner medication.  °You may start showering once you are discharged home but do not submerge the incision under water. Just pat the incision dry and apply a dry gauze dressing on daily. °Walk with walker as instructed.  °You may resume a sexual relationship in one month or when given the OK by  your doctor.  °· Use walker as long as suggested by your caregivers. °· Avoid periods of inactivity such as sitting longer than an hour when not asleep. This helps prevent blood clots.  °You may put full weight on your legs and walk as much as is comfortable.  °You may return to work once you are cleared by your doctor.  °Do not drive a car for 6 weeks or until released by you surgeon.  °· Do not drive while taking narcotics.  °Wear the elastic stockings for three weeks following surgery during the day but you may remove then at night. °Make sure you keep all of your appointments after your  operation with all of your doctors and caregivers. You should call the office at the above phone number and make an appointment for approximately two weeks after the date of your surgery. °Change the dressing daily and reapply a dry dressing each time. °Please pick up a stool softener and laxative for home use as long as you are requiring pain medications. °· ICE to the affected knee every three hours for 30 minutes at a time and then as needed for pain and swelling.  Continue to use ice on the knee for pain and swelling from surgery. You may notice swelling that will progress down to the foot and ankle.  This is normal after surgery.  Elevate the leg when you are not up walking on it.   °It is important for you to complete the blood thinner medication as prescribed by your doctor. °· Continue to use the breathing machine which will help keep your temperature down.  It is common for your temperature to cycle up and down following surgery, especially at night when you are not up moving around and exerting yourself.  The breathing machine keeps your lungs expanded and your temperature down. ° °RANGE OF MOTION AND STRENGTHENING EXERCISES  °Rehabilitation of the knee is important following a knee injury or an operation. After just a few days of immobilization, the muscles of the thigh which control the knee become weakened and shrink (atrophy). Knee exercises are designed to build up the tone and strength of the thigh muscles and to improve knee   motion. Often times heat used for twenty to thirty minutes before working out will loosen up your tissues and help with improving the range of motion but do not use heat for the first two weeks following surgery. These exercises can be done on a training (exercise) mat, on the floor, on a table or on a bed. Use what ever works the best and is most comfortable for you Knee exercises include:  Leg Lifts - While your knee is still immobilized in a splint or cast, you can do  straight leg raises. Lift the leg to 60 degrees, hold for 3 sec, and slowly lower the leg. Repeat 10-20 times 2-3 times daily. Perform this exercise against resistance later as your knee gets better.  Quad and Hamstring Sets - Tighten up the muscle on the front of the thigh (Quad) and hold for 5-10 sec. Repeat this 10-20 times hourly. Hamstring sets are done by pushing the foot backward against an object and holding for 5-10 sec. Repeat as with quad sets.  A rehabilitation program following serious knee injuries can speed recovery and prevent re-injury in the future due to weakened muscles. Contact your doctor or a physical therapist for more information on knee rehabilitation.   SKILLED REHAB INSTRUCTIONS: If the patient is transferred to a skilled rehab facility following release from the hospital, a list of the current medications will be sent to the facility for the patient to continue.  When discharged from the skilled rehab facility, please have the facility set up the patient's [place] prior to being released. Also, the skilled facility will be responsible for providing the patient with their medications at time of release from the facility to include their pain medication, the muscle relaxants, and their blood thinner medication. If the patient is still at the rehab facility at time of the two week follow up appointment, the skilled rehab facility will also need to assist the patient in arranging follow up appointment in our office and any transportation needs.  MAKE SURE YOU:  Understand these instructions.  Will watch your condition.  Will get help right away if you are not doing well or get worse.    Pick up stool softner and laxative for home use following surgery while on pain medications. Do not submerge incision under water. Please use good hand washing techniques while changing dressing each day. May shower starting three days after surgery. Please use a clean  towel to pat the incision dry following showers. Continue to use ice for pain and swelling after surgery. Do not use any lotions or creams on the incision until instructed by your surgeon.  Take Coumadin for a total of 4 weeks and then discontinue.  The dose may need to be adjusted based upon the INR.  Please follow the INR and titrate Coumadin dose for a therapeutic range between 2.0 and 3.0 INR.  After completing the 4 weeks of Coumadin, the patient may stop the Coumadin and then take an 81 mg Aspirin daily for four more weeks.  Continue Lovenox injections until the INR is therapeutic at or greater than 2.0.  When INR reaches the therapeutic level of equal to or greater than 2.0, the patient may discontinue the Lovenox injections.  Postoperative Constipation Protocol  Constipation - defined medically as fewer than three stools per week and severe constipation as less than one stool per week.  One of the most common issues patients have following surgery is constipation.  Even if you  have a regular bowel pattern at home, your normal regimen is likely to be disrupted due to multiple reasons following surgery.  Combination of anesthesia, postoperative narcotics, change in appetite and fluid intake all can affect your bowels.  In order to avoid complications following surgery, here are some recommendations in order to help you during your recovery period.  Colace (docusate) - Pick up an over-the-counter form of Colace or another stool softener and take twice a day as long as you are requiring postoperative pain medications.  Take with a full glass of water daily.  If you experience loose stools or diarrhea, hold the colace until you stool forms back up.  If your symptoms do not get better within 1 week or if they get worse, check with your doctor.  Dulcolax (bisacodyl) - Pick up over-the-counter and take as directed by the product packaging as needed to assist with the movement of your bowels.  Take  with a full glass of water.  Use this product as needed if not relieved by Colace only.   MiraLax (polyethylene glycol) - Pick up over-the-counter to have on hand.  MiraLax is a solution that will increase the amount of water in your bowels to assist with bowel movements.  Take as directed and can mix with a glass of water, juice, soda, coffee, or tea.  Take if you go more than two days without a movement. Do not use MiraLax more than once per day. Call your doctor if you are still constipated or irregular after using this medication for 7 days in a row.  If you continue to have problems with postoperative constipation, please contact the office for further assistance and recommendations.  If you experience "the worst abdominal pain ever" or develop nausea or vomiting, please contact the office immediatly for further recommendations for treatment.   Information on my medicine - Coumadin   (Warfarin)  This medication education was reviewed with me or my healthcare representative as part of my discharge preparation.  The pharmacist that spoke with me during my hospital stay was:  WOFFORD, DREW A, RPH  Why was Coumadin prescribed for you? Coumadin was prescribed for you because you have a blood clot or a medical condition that can cause an increased risk of forming blood clots. Blood clots can cause serious health problems by blocking the flow of blood to the heart, lung, or brain. Coumadin can prevent harmful blood clots from forming. As a reminder your indication for Coumadin is:   Blood Clot Prevention After Orthopedic Surgery  What test will check on my response to Coumadin? While on Coumadin (warfarin) you will need to have an INR test regularly to ensure that your dose is keeping you in the desired range. The INR (international normalized ratio) number is calculated from the result of the laboratory test called prothrombin time (PT).  If an INR APPOINTMENT HAS NOT ALREADY BEEN MADE FOR YOU  please schedule an appointment to have this lab work done by your health care provider within 7 days. Your INR goal is usually a number between:  2 to 3 or your provider may give you a more narrow range like 2-2.5.  Ask your health care provider during an office visit what your goal INR is.  What  do you need to  know  About  COUMADIN? Take Coumadin (warfarin) exactly as prescribed by your healthcare provider about the same time each day.  DO NOT stop taking without talking to the doctor who prescribed  the medication.  Stopping without other blood clot prevention medication to take the place of Coumadin may increase your risk of developing a new clot or stroke.  Get refills before you run out.  What do you do if you miss a dose? If you miss a dose, take it as soon as you remember on the same day then continue your regularly scheduled regimen the next day.  Do not take two doses of Coumadin at the same time.  Important Safety Information A possible side effect of Coumadin (Warfarin) is an increased risk of bleeding. You should call your healthcare provider right away if you experience any of the following: ? Bleeding from an injury or your nose that does not stop. ? Unusual colored urine (red or dark brown) or unusual colored stools (red or black). ? Unusual bruising for unknown reasons. ? A serious fall or if you hit your head (even if there is no bleeding).  Some foods or medicines interact with Coumadin (warfarin) and might alter your response to warfarin. To help avoid this: ? Eat a balanced diet, maintaining a consistent amount of Vitamin K. ? Notify your provider about major diet changes you plan to make. ? Avoid alcohol or limit your intake to 1 drink for women and 2 drinks for men per day. (1 drink is 5 oz. wine, 12 oz. beer, or 1.5 oz. liquor.)  Make sure that ANY health care provider who prescribes medication for you knows that you are taking Coumadin (warfarin).  Also make sure the  healthcare provider who is monitoring your Coumadin knows when you have started a new medication including herbals and non-prescription products.  Coumadin (Warfarin)  Major Drug Interactions  Increased Warfarin Effect Decreased Warfarin Effect  Alcohol (large quantities) Antibiotics (esp. Septra/Bactrim, Flagyl, Cipro) Amiodarone (Cordarone) Aspirin (ASA) Cimetidine (Tagamet) Megestrol (Megace) NSAIDs (ibuprofen, naproxen, etc.) Piroxicam (Feldene) Propafenone (Rythmol SR) Propranolol (Inderal) Isoniazid (INH) Posaconazole (Noxafil) Barbiturates (Phenobarbital) Carbamazepine (Tegretol) Chlordiazepoxide (Librium) Cholestyramine (Questran) Griseofulvin Oral Contraceptives Rifampin Sucralfate (Carafate) Vitamin K   Coumadin (Warfarin) Major Herbal Interactions  Increased Warfarin Effect Decreased Warfarin Effect  Garlic Ginseng Ginkgo biloba Coenzyme Q10 Green tea St. Johns wort    Coumadin (Warfarin) FOOD Interactions  Eat a consistent number of servings per week of foods HIGH in Vitamin K (1 serving =  cup)  Collards (cooked, or boiled & drained) Kale (cooked, or boiled & drained) Mustard greens (cooked, or boiled & drained) Parsley *serving size only =  cup Spinach (cooked, or boiled & drained) Swiss chard (cooked, or boiled & drained) Turnip greens (cooked, or boiled & drained)  Eat a consistent number of servings per week of foods MEDIUM-HIGH in Vitamin K (1 serving = 1 cup)  Asparagus (cooked, or boiled & drained) Broccoli (cooked, boiled & drained, or raw & chopped) Brussel sprouts (cooked, or boiled & drained) *serving size only =  cup Lettuce, raw (green leaf, endive, romaine) Spinach, raw Turnip greens, raw & chopped   These websites have more information on Coumadin (warfarin):  FailFactory.se; VeganReport.com.au;

## 2015-03-08 NOTE — Progress Notes (Signed)
Physical Therapy Treatment Patient Details Name: Jamie Martinez MRN: [id number] DOB: 08-06-1956 Today's Date: 03/08/2015    History of Present Illness Bil TKR    PT Comments    Progressing with no c/o dizziness with standing this pm.  Pt reports increased R knee pain - RN alerted.  Follow Up Recommendations  CIR     Equipment Recommendations  None recommended by PT    Recommendations for Other Services OT consult     Precautions / Restrictions Precautions Precautions: Knee;Fall Required Braces or Orthoses: Knee Immobilizer - Right;Knee Immobilizer - Left Knee Immobilizer - Right: Discontinue once straight leg raise with < 10 degree lag Knee Immobilizer - Left: Discontinue once straight leg raise with < 10 degree lag Restrictions Weight Bearing Restrictions: No Other Position/Activity Restrictions: WBAT    Mobility  Bed Mobility Overal bed mobility: Needs Assistance;+2 for physical assistance;+ 2 for safety/equipment Bed Mobility: Supine to Sit;Sit to Supine     Supine to sit: Mod assist;+2 for physical assistance;+2 for safety/equipment Sit to supine: Mod assist;+2 for physical assistance;+2 for safety/equipment   General bed mobility comments: cues for sequence and physical assist to manage LEs and to control trunk  Transfers Overall transfer level: Needs assistance Equipment used: Rolling walker (2 wheeled) Transfers: Sit to/from Stand Sit to Stand: +2 physical assistance;+2 safety/equipment;From elevated surface;Mod assist         General transfer comment: cues for LE management and use of UEs to self assist  Ambulation/Gait Ambulation/Gait assistance: Mod assist;+2 physical assistance;+2 safety/equipment Ambulation Distance (Feet): 1 Feet Assistive device: Rolling walker (2 wheeled) Gait Pattern/deviations: Step-to pattern;Decreased step length - right;Decreased step length - left;Shuffle     General Gait Details: 2 steps only to align with bedside -  cues for sequence and assist for support/balance and to advance L LE   Stairs            Wheelchair Mobility    Modified Rankin (Stroke Patients Only)       Balance                                    Cognition Arousal/Alertness: Awake/alert Behavior During Therapy: WFL for tasks assessed/performed Overall Cognitive Status: Within Functional Limits for tasks assessed                      Exercises      General Comments        Pertinent Vitals/Pain Pain Assessment: 0-10 Pain Score: 4  Pain Location: R knee Pain Descriptors / Indicators: Aching;Sore Pain Intervention(s): Limited activity within patient's tolerance;Monitored during session;Premedicated before session;Ice applied    Home Living                      Prior Function            PT Goals (current goals can now be found in the care plan section) Acute Rehab PT Goals Patient Stated Goal: Rehab and home to walk with less pain PT Goal Formulation: With patient Time For Goal Achievement: 03/15/15 Potential to Achieve Goals: Good Progress towards PT goals: Progressing toward goals    Frequency  7X/week    PT Plan Current plan remains appropriate    Co-evaluation             End of Session Equipment Utilized During Treatment: Gait belt;Right knee immobilizer;Left knee immobilizer Activity Tolerance: Patient  tolerated treatment well;Other (comment);Patient limited by pain Patient left: in bed;with call bell/phone within reach;with family/visitor present     Time: 6010-9323 PT Time Calculation (min) (ACUTE ONLY): 22 min  Charges:  $Therapeutic Activity: 8-22 mins                    G Codes:      Jamie Martinez Mar 26, 2015, 3:06 PM

## 2015-03-08 NOTE — Progress Notes (Signed)
   Subjective: 1 Day Post-Op Procedure(s) (LRB): BILATERAL TOTAL KNEE ARTHROPLASTY (Bilateral) Patient reports pain as mild.   Patient seen in rounds with Dr. Wynelle Link.  She looks very good following bilateral total knee procedures. Patient is well, but has had some minor complaints of pain in the knees, requiring pain medications We will start therapy today.  Plan is to go Rehab after hospital stay.  Objective: Vital signs in last 24 hours: Temp:  [97.8 F (36.6 C)-98.9 F (37.2 C)] 98.1 F (36.7 C) (03/17 0536) Pulse Rate:  [63-91] 82 (03/17 0536) Resp:  [11-24] 16 (03/17 0536) BP: (97-133)/(47-107) 97/57 mmHg (03/17 0536) SpO2:  [96 %-100 %] 97 % (03/17 0536) FiO2 (%):  [95 %] 95 % (03/17 0039)  Intake/Output from previous day:  Intake/Output Summary (Last 24 hours) at 03/08/15 0953 Last data filed at 03/08/15 0805  Gross per 24 hour  Intake 3726.67 ml  Output   3240 ml  Net 486.67 ml    Intake/Output this shift: Total I/O In: 240 [P.O.:240] Out: 75 [Urine:75]  Labs:  Recent Labs  03/08/15 0505  HGB 10.5*    Recent Labs  03/08/15 0505  WBC 8.6  RBC 3.44*  HCT 31.6*  PLT 160    Recent Labs  03/08/15 0505  NA 140  K 4.0  CL 105  CO2 26  BUN 9  CREATININE 0.44*  GLUCOSE 145*  CALCIUM 8.5    Recent Labs  03/08/15 0505  INR 1.11    EXAM General - Patient is Alert, Appropriate and Oriented Extremity - Neurovascular intact Sensation intact distally Dorsiflexion/Plantar flexion intact Dressing - dressing C/D/I Motor Function - intact, moving feet and toes well on exam.  Both Hemovacs pulled without difficulty.  Past Medical History  Diagnosis Date  . Hypothyroidism   . Arthritis   . Cancer 2010    left breast surgery and radiation ,  dec 2013 colon chemo done for colon    Assessment/Plan: 1 Day Post-Op Procedure(s) (LRB): BILATERAL TOTAL KNEE ARTHROPLASTY (Bilateral) Principal Problem:   OA (osteoarthritis) of knee  Estimated  body mass index is 31.18 kg/(m^2) as calculated from the following:   Height as of this encounter: 5\' 8"  (1.727 m).   Weight as of this encounter: 92.987 kg (205 lb). Advance diet Up with therapy Continue foley due to strict I&O and urinary output monitoring and indwelling epidural catheter. Discharge to CIR Continue foley for now.  Will keep foley until tomorrow and will not be removed until at least 6-8 hours following the removal of the epidural catheter.  DVT Prophylaxis - Lovenox and Coumadin, Lovenox will not start until tomorrow afternoon following removal of the epidural. First dose of Coumadin this evening. Weight-Bearing as tolerated to both leg  Continue O2 and Pulse OX   Take Coumadin for four weeks and then discontinue.  The dose may need to be adjusted based upon the INR.  Please follow the INR and titrate Coumadin dose for a therapeutic range between 2.0 and 3.0 INR.  After completing the four weeks of Coumadin, the patient may stop the Coumadin and resume their 81 mg Aspirin daily.  Lovenox injections will start tomorrow evening after the epidural has been removed and continue until the INR is therapeutic at or greater than 2.0.  When INR reaches the therapeutic level of equal to or greater than 2.0, the patient may discontinue the Lovenox injections.  Arlee Muslim, PA-C Orthopaedic Surgery 03/08/2015, 9:53 AM

## 2015-03-08 NOTE — Care Management Note (Signed)
    Page 1 of 1   03/08/2015     11:44:58 AM CARE MANAGEMENT NOTE 03/08/2015  Patient:  Jamie Martinez, Jamie Martinez   Account Number:  [id number]  Date Initiated:  03/08/2015  Documentation initiated by:  [facility]  Subjective/Objective Assessment:   adm: BILATERAL TOTAL KNEE ARTHROPLASTY (Bilateral)     Action/Plan:   discharge planning   Anticipated DC Date:  03/08/2015   Anticipated DC Plan:  IP REHAB FACILITY      DC Planning Services  CM consult      Choice offered to / List presented to:             Status of service:  Completed, signed off Medicare Important Message given?   (If response is "NO", the following Medicare IM given date fields will be blank) Date Medicare IM given:   Medicare IM given by:   Date Additional Medicare IM given:   Additional Medicare IM given by:    Discharge Disposition:  IP REHAB FACILITY  Per UR Regulation:  Reviewed for med. necessity/level of care/duration of stay  If discussed at [place] of Stay Meetings, dates discussed:    Comments:  03/08/15 CM notes plan is for pt to go to CIR; CIR rep, Sharyn Lull is evaluating. Mariane Masters, BSN, CM [phone].

## 2015-03-08 NOTE — Progress Notes (Signed)
POD #1  Doing well, minimal pain complaints , Ropiviacaine gtt at14 Has been up today. No significant numbness.  Plan D/C tomorrow

## 2015-03-09 LAB — CBC
HEMATOCRIT: 26.3 % — AB (ref 36.0–46.0)
Hemoglobin: 8.4 g/dL — ABNORMAL LOW (ref 12.0–15.0)
MCH: 29.9 pg (ref 26.0–34.0)
MCHC: 31.9 g/dL (ref 30.0–36.0)
MCV: 93.6 fL (ref 78.0–100.0)
Platelets: 126 10*3/uL — ABNORMAL LOW (ref 150–400)
RBC: 2.81 MIL/uL — ABNORMAL LOW (ref 3.87–5.11)
RDW: 13.6 % (ref 11.5–15.5)
WBC: 7.3 10*3/uL (ref 4.0–10.5)

## 2015-03-09 LAB — BASIC METABOLIC PANEL
ANION GAP: 4 — AB (ref 5–15)
BUN: 12 mg/dL (ref 6–23)
CO2: 31 mmol/L (ref 19–32)
Calcium: 8.2 mg/dL — ABNORMAL LOW (ref 8.4–10.5)
Chloride: 106 mmol/L (ref 96–112)
Creatinine, Ser: 0.44 mg/dL — ABNORMAL LOW (ref 0.50–1.10)
GFR calc Af Amer: >90 mL/min (ref >90)
GLUCOSE: 106 mg/dL — AB (ref 70–99)
Potassium: 4.1 mmol/L (ref 3.5–5.1)
SODIUM: 141 mmol/L (ref 135–145)

## 2015-03-09 LAB — PROTIME-INR
INR: 1.15 (ref 0.00–1.49)
Prothrombin Time: 14.8 seconds (ref 11.6–15.2)

## 2015-03-09 MED ORDER — GUAIFENESIN-DM 100-10 MG/5ML PO SYRP: 5.0000 mL | ORAL_SOLUTION | ORAL | Status: DC | PRN

## 2015-03-09 MED ORDER — ENOXAPARIN SODIUM 30 MG/0.3ML ~~LOC~~ SOLN
30.0000 mg | Freq: Two times a day (BID) | SUBCUTANEOUS | Status: DC
  Administered 2015-03-10 – 2015-03-12 (×5): 30 mg via SUBCUTANEOUS
  Filled 2015-03-09 (×7): qty 0.3

## 2015-03-09 MED ORDER — WARFARIN SODIUM 7.5 MG PO TABS
7.5000 mg | ORAL_TABLET | Freq: Once | ORAL | Status: AC
  Administered 2015-03-09: 7.5 mg via ORAL
  Filled 2015-03-09: qty 1

## 2015-03-09 MED ORDER — POLYSACCHARIDE IRON COMPLEX 150 MG PO CAPS
150.0000 mg | ORAL_CAPSULE | Freq: Every day | ORAL | Status: DC
  Administered 2015-03-09 – 2015-03-12 (×4): 150 mg via ORAL
  Filled 2015-03-09 (×4): qty 1

## 2015-03-09 NOTE — Evaluation (Signed)
Occupational Therapy Evaluation Patient Details Name: Jamie Martinez MRN: [id number] DOB: 1956-03-01 Today's Date: 03/09/2015    History of Present Illness Pt is s/p Bil TKR   Clinical Impression   Pt is very motivated and participatory with therapy. She was able to transfer to EOB with assist and start working on LB self care. Using the walker and therapists assisting, pt transferred almost to the doorway before feeling a little lightheaded and needing to sit down. Chair pulled up. Will follow on acute to progress ADL independence for next venue. Pt is a great CIR candidate.    Follow Up Recommendations  CIR;Supervision/Assistance - 24 hour    Equipment Recommendations   (TBA next venue)    Recommendations for Other Services       Precautions / Restrictions Precautions Precautions: Knee;Fall Required Braces or Orthoses: Knee Immobilizer - Right;Knee Immobilizer - Left Knee Immobilizer - Right: Discontinue once straight leg raise with < 10 degree lag Knee Immobilizer - Left: Discontinue once straight leg raise with < 10 degree lag Restrictions Weight Bearing Restrictions: No Other Position/Activity Restrictions: WBAT      Mobility Bed Mobility Overal bed mobility: Needs Assistance;+2 for physical assistance;+ 2 for safety/equipment Bed Mobility: Supine to Sit     Supine to sit: Mod assist;+2 for physical assistance;+2 for safety/equipment     General bed mobility comments: asisst for LEs, verbal cues for sequence  Transfers Overall transfer level: Needs assistance Equipment used: Rolling walker (2 wheeled) Transfers: Sit to/from Stand Sit to Stand: +2 physical assistance;+2 safety/equipment;Mod assist         General transfer comment: assist to rise and steady, cues for hand placement and LE management. Assist to descend to chair and extend LEs out in front.    Balance    min guard assist to sit on EOB and attempt to lean forward and straighten sock.                                         ADL Overall ADL's : Needs assistance/impaired Eating/Feeding: Independent;Sitting   Grooming: Wash/dry hands;Wash/dry face;Set up;Sitting   Upper Body Bathing: Set up;Supervision/ safety;Sitting   Lower Body Bathing: +2 for safety/equipment;+2 for physical assistance;Maximal assistance;Sit to/from stand   Upper Body Dressing : Minimal assistance;Sitting Upper Body Dressing Details (indicate cue type and reason): with gown due to lines Lower Body Dressing: +2 for safety/equipment;+2 for physical assistance;Total assistance;Sit to/from stand Lower Body Dressing Details (indicate cue type and reason): attempted to reach down to straighten R sock but unable to get far enough to top of sock to adjust it herself.  Toilet Transfer: +2 for physical assistance;+2 for safety/equipment;Moderate assistance;RW Toilet Transfer Details (indicate cue type and reason): sit to stand and take several steps (almost to doorway) Toileting- Clothing Manipulation and Hygiene: +2 for safety/equipment;+2 for physical assistance;Total assistance;Sit to/from stand Toileting - Clothing Manipulation Details (indicate cue type and reason): +2 mod assist to stand and total assist due to pt unable to free UEs from walker to manage clothing, etc.       General ADL Comments: Educated on AE options for LB self care and coverage. Pt began to feel a little lightheaded once she approached doorway with walker and therapist assisting so chair pulled up and assisted pt to sit.      Vision     Perception     Praxis  Pertinent Vitals/Pain Pain Assessment: 0-10 Pain Score: 7  Pain Location: R knee Pain Descriptors / Indicators: Aching Pain Intervention(s): Repositioned;Monitored during session;Ice applied     Hand Dominance Right   Extremity/Trunk Assessment Upper Extremity Assessment Upper Extremity Assessment: Overall WFL for tasks assessed            Communication Communication Communication: No difficulties   Cognition Arousal/Alertness: Awake/alert Behavior During Therapy: WFL for tasks assessed/performed Overall Cognitive Status: Within Functional Limits for tasks assessed                     General Comments       Exercises       Shoulder Instructions      Home Living Family/patient expects to be discharged to:: Inpatient rehab Living Arrangements: Spouse/significant other                                      Prior Functioning/Environment Level of Independence: Independent             OT Diagnosis: Generalized weakness;Acute pain   OT Problem List: Decreased strength;Decreased knowledge of use of DME or AE;Pain   OT Treatment/Interventions: Self-care/ADL training;Patient/family education;Therapeutic activities;DME and/or AE instruction    OT Goals(Current goals can be found in the care plan section) Acute Rehab OT Goals Patient Stated Goal: to rehab and return to independence. OT Goal Formulation: With patient/family Time For Goal Achievement: 03/16/15 Potential to Achieve Goals: Good  OT Frequency: Min 2X/week   Barriers to D/C:            Co-evaluation PT/OT/SLP Co-Evaluation/Treatment: Yes Reason for Co-Treatment: For patient/therapist safety   OT goals addressed during session: ADL's and self-care;Proper use of Adaptive equipment and DME      End of Session Equipment Utilized During Treatment: Rolling walker;Right knee immobilizer;Left knee immobilizer CPM Left Knee CPM Left Knee: Off CPM Right Knee CPM Right Knee: Off  Activity Tolerance: Patient tolerated treatment well;Other (comment) (slight lightheadedness toward end of session) Patient left: in chair;with call bell/phone within reach;with family/visitor present   Time: 1031-1101 OT Time Calculation (min): 30 min Charges:  OT General Charges $OT Visit: 1 Procedure OT Evaluation $Initial OT Evaluation Tier  I: 1 Procedure G-Codes:    Jules Schick  [phone] 03/09/2015, 11:14 AM

## 2015-03-09 NOTE — Progress Notes (Signed)
Rehab admissions - Patient asleep on rounds this am.  I spoke with Dian Situ, [place] and potentially patient may be able to admit to acute inpatient rehab tomorrow, Saturday.  I will plan for admission.  I will need to fax clinicals to [facility] by 12 noon today.  So, I need the OT eval to be completed and documented in epic in time so that I can fax before noon today.  Call me for questions.  #[phone]

## 2015-03-09 NOTE — Progress Notes (Signed)
POD#2.  Doing well. No fever. VSS.  Epidural removed, tip and site intact.  [employer]

## 2015-03-09 NOTE — Progress Notes (Signed)
Physical Therapy Treatment Patient Details Name: Jamie Martinez MRN: [id number] DOB: Feb 19, 1956 Today's Date: 03/09/2015    History of Present Illness Pt is s/p Bil TKR    PT Comments    Pt very motivated and with marked improvement in L LE control.  Follow Up Recommendations  CIR     Equipment Recommendations  None recommended by PT    Recommendations for Other Services OT consult     Precautions / Restrictions Precautions Precautions: Knee;Fall Required Braces or Orthoses: Knee Immobilizer - Right;Knee Immobilizer - Left Knee Immobilizer - Right: Discontinue once straight leg raise with < 10 degree lag Knee Immobilizer - Left: Discontinue once straight leg raise with < 10 degree lag Restrictions Weight Bearing Restrictions: No Other Position/Activity Restrictions: WBAT    Mobility  Bed Mobility Overal bed mobility: Needs Assistance;+2 for physical assistance;+ 2 for safety/equipment Bed Mobility: Supine to Sit     Supine to sit: Mod assist;+2 for physical assistance;+2 for safety/equipment     General bed mobility comments: asisst for LEs, verbal cues for sequence  Transfers Overall transfer level: Needs assistance Equipment used: Rolling walker (2 wheeled) Transfers: Sit to/from Stand Sit to Stand: +2 physical assistance;+2 safety/equipment;Mod assist         General transfer comment: assist to rise and steady, cues for hand placement and LE management. Assist to descend to chair and extend LEs out in front.  Ambulation/Gait Ambulation/Gait assistance: Mod assist;+2 physical assistance;+2 safety/equipment Ambulation Distance (Feet): 7 Feet Assistive device: Rolling walker (2 wheeled) Gait Pattern/deviations: Decreased step length - right;Decreased step length - left;Step-to pattern;Shuffle;Trunk flexed Gait velocity: decr   General Gait Details: cues for posture, position from RW and sequence   Stairs            Wheelchair Mobility     Modified Rankin (Stroke Patients Only)       Balance                                    Cognition Arousal/Alertness: Awake/alert Behavior During Therapy: WFL for tasks assessed/performed Overall Cognitive Status: Within Functional Limits for tasks assessed                      Exercises Total Joint Exercises Ankle Circles/Pumps: AROM;Both;15 reps;Supine Quad Sets: AROM;Both;Supine;15 reps Heel Slides: AAROM;Both;Supine;15 reps Straight Leg Raises: AAROM;Both;Supine;15 reps Goniometric ROM: AAROM at R knee -10-  35; L knee -10 - 45    General Comments General comments (skin integrity, edema, etc.): min guard sitting EOB to reach to attempt to straighten sock.      Pertinent Vitals/Pain Pain Assessment: 0-10 Pain Score: 7  Pain Location: R knee Pain Descriptors / Indicators: Aching Pain Intervention(s): Repositioned;Monitored during session;Ice applied    Home Living Family/patient expects to be discharged to:: Inpatient rehab Living Arrangements: Spouse/significant other                  Prior Function Level of Independence: Independent          PT Goals (current goals can now be found in the care plan section) Acute Rehab PT Goals Patient Stated Goal: to rehab and return to independence. PT Goal Formulation: With patient Time For Goal Achievement: 03/15/15 Potential to Achieve Goals: Good Progress towards PT goals: Progressing toward goals    Frequency  7X/week    PT Plan Current plan remains appropriate    Co-evaluation  PT/OT/SLP Co-Evaluation/Treatment: Yes Reason for Co-Treatment: For patient/therapist safety PT goals addressed during session: Mobility/safety with mobility OT goals addressed during session: ADL's and self-care;Proper use of Adaptive equipment and DME     End of Session Equipment Utilized During Treatment: Gait belt;Right knee immobilizer;Left knee immobilizer Activity Tolerance: Patient tolerated  treatment well;Patient limited by pain Patient left: in chair;with call bell/phone within reach;with family/visitor present     Time: 3536-1443 PT Time Calculation (min) (ACUTE ONLY): 40 min  Charges:  $Gait Training: 8-22 mins $Therapeutic Exercise: 8-22 mins                    G Codes:      Tafari Humiston 03-17-15, 1:47 PM

## 2015-03-09 NOTE — Progress Notes (Signed)
Physical Therapy Treatment Patient Details Name: Jamie Martinez MRN: [id number] DOB: [birth year] Today's Date: 03/09/2015    History of Present Illness Pt is s/p Bil TKR    PT Comments    Pt continues motivated and progressing slowly with mobility.  Epidural removed this pm.  Follow Up Recommendations  CIR     Equipment Recommendations  None recommended by PT    Recommendations for Other Services OT consult     Precautions / Restrictions Precautions Precautions: Knee;Fall Required Braces or Orthoses: Knee Immobilizer - Right;Knee Immobilizer - Left Knee Immobilizer - Right: Discontinue once straight leg raise with < 10 degree lag Knee Immobilizer - Left: Discontinue once straight leg raise with < 10 degree lag Restrictions Weight Bearing Restrictions: No Other Position/Activity Restrictions: WBAT    Mobility  Bed Mobility Overal bed mobility: Needs Assistance;+2 for physical assistance;+ 2 for safety/equipment Bed Mobility: Sit to Supine     Supine to sit: Mod assist;+2 for physical assistance;+2 for safety/equipment Sit to supine: Mod assist;+2 for physical assistance;+2 for safety/equipment   General bed mobility comments: asisst for LEs, verbal cues for sequence  Transfers Overall transfer level: Needs assistance Equipment used: Rolling walker (2 wheeled) Transfers: Sit to/from Stand Sit to Stand: +2 physical assistance;+2 safety/equipment;Mod assist         General transfer comment: assist to rise and steady, cues for hand placement and LE management. Assist to descend to chair and extend LEs out in front.  Ambulation/Gait Ambulation/Gait assistance: Mod assist;+2 physical assistance;+2 safety/equipment Ambulation Distance (Feet): 5 Feet Assistive device: Rolling walker (2 wheeled) Gait Pattern/deviations: Step-to pattern;Decreased step length - right;Decreased step length - left;Shuffle;Antalgic Gait velocity: decr   General Gait Details: cues for  posture, position from RW and sequence   Stairs            Wheelchair Mobility    Modified Rankin (Stroke Patients Only)       Balance                                    Cognition Arousal/Alertness: Awake/alert Behavior During Therapy: WFL for tasks assessed/performed Overall Cognitive Status: Within Functional Limits for tasks assessed                      Exercises      General Comments General comments (skin integrity, edema, etc.): min guard sitting EOB to reach to attempt to straighten sock.      Pertinent Vitals/Pain Pain Assessment: 0-10 Pain Score: 7  Pain Location: Bil knees Pain Descriptors / Indicators: Aching;Sore Pain Intervention(s): Limited activity within patient's tolerance;Monitored during session;Premedicated before session;Ice applied    Home Living Family/patient expects to be discharged to:: Inpatient rehab Living Arrangements: Spouse/significant other                  Prior Function Level of Independence: Independent          PT Goals (current goals can now be found in the care plan section) Acute Rehab PT Goals Patient Stated Goal: to rehab and return to independence. PT Goal Formulation: With patient Time For Goal Achievement: 03/15/15 Potential to Achieve Goals: Good Progress towards PT goals: Progressing toward goals    Frequency  7X/week    PT Plan Current plan remains appropriate    Co-evaluation   Reason for Co-Treatment: For patient/therapist safety   OT goals addressed during session: ADL's and  self-care;Proper use of Adaptive equipment and DME     End of Session Equipment Utilized During Treatment: Gait belt;Right knee immobilizer;Left knee immobilizer Activity Tolerance: Patient tolerated treatment well;Patient limited by pain Patient left: with call bell/phone within reach;with family/visitor present;in bed     Time: 1400-1430 PT Time Calculation (min) (ACUTE ONLY): 30  min  Charges:  $Gait Training: 8-22 mins $Therapeutic Activity: 8-22 mins                    G Codes:      Mistee Soliman 2015-03-19, 2:39 PM

## 2015-03-09 NOTE — Progress Notes (Signed)
Subjective: 2 Days Post-Op Procedure(s) (LRB): BILATERAL TOTAL KNEE ARTHROPLASTY (Bilateral) Patient reports pain as mild and moderate.   Patient seen in rounds with Dr. Wynelle Link. Patient is well, but has had some minor complaints of pain in the knees, requiring pain medications Epidural to come out today.  Anticipate possible increase in pain temporarily after the epidural has been removed. Plan is to go Rehab after hospital stay.  Objective: Vital signs in last 24 hours: Temp:  [97.3 F (36.3 C)-99.5 F (37.5 C)] 97.8 F (36.6 C) (03/18 0537) Pulse Rate:  [80-92] 91 (03/18 0537) Resp:  [15-16] 16 (03/18 0537) BP: (100-113)/(51-63) 113/63 mmHg (03/18 0537) SpO2:  [94 %-100 %] 100 % (03/18 0537)  Intake/Output from previous day:  Intake/Output Summary (Last 24 hours) at 03/09/15 0841 Last data filed at 03/09/15 0639  Gross per 24 hour  Intake   1560 ml  Output   1450 ml  Net    110 ml     Labs:  Recent Labs  03/08/15 0505 03/09/15 0700  HGB 10.5* 8.4*    Recent Labs  03/08/15 0505 03/09/15 0700  WBC 8.6 7.3  RBC 3.44* 2.81*  HCT 31.6* 26.3*  PLT 160 126*    Recent Labs  03/08/15 0505 03/09/15 0700  NA 140 141  K 4.0 4.1  CL 105 106  BUN 9 12  CREATININE 0.44* 0.44*  GLUCOSE 145* 106*  CALCIUM 8.5 8.2*    Recent Labs  03/08/15 0505 03/09/15 0700  INR 1.11 1.15    EXAM General - Patient is Alert, Appropriate and Oriented Extremity - Neurovascular intact Sensation intact distally Dorsiflexion/Plantar flexion intact No cellulitis present Dressing/Incision - clean, dry, no drainage, healing Motor Function - intact, moving feet and toes well on exam.    Past Medical History  Diagnosis Date  . Hypothyroidism   . Arthritis   . Cancer 2010    left breast surgery and radiation ,  dec 2013 colon chemo done for colon    Assessment/Plan: 2 Days Post-Op Procedure(s) (LRB): BILATERAL TOTAL KNEE ARTHROPLASTY (Bilateral) Principal Problem:   OA (osteoarthritis) of knee  Estimated body mass index is 31.18 kg/(m^2) as calculated from the following:   Height as of this encounter: 5\' 8"  (1.727 m).   Weight as of this encounter: 92.987 kg (205 lb). Up with therapy Continue foley due to urinary output monitoring and she still has her epidural in place.  Will remove the catheter six hours after the epidural is removed.  Will need to note in chart when the epidural is pulled.  DVT Prophylaxis - Lovenox and Coumadin, Lovenox will not start until later this afternoon though after the epidural has been removed for twelve hours.  Will need to note in chart when the epidural is pulled. Anticipate possible increase in pain temporarily after the epidural has been removed.  Weight-Bearing as tolerated to both legs  Take Coumadin for four weeks and then discontinue.  The dose may need to be adjusted based upon the INR.  Please follow the INR and titrate Coumadin dose for a therapeutic range between 2.0 and 3.0 INR.  After completing the four weeks of Coumadin, the patient may stop the Coumadin and resume their 81 mg Aspirin daily.  Lovenox injections will start later this evening after the epidural has been removed and continue until the INR is therapeutic at or greater than 2.0.  When INR reaches the therapeutic level of equal to or greater than 2.0, the patient may  discontinue the Lovenox injections.  Arlee Muslim, PA-C Orthopaedic Surgery 03/09/2015, 8:41 AM

## 2015-03-09 NOTE — Progress Notes (Signed)
Rehab admissions - I have approval for inpatient rehab admission at [facility] for tomorrow, Saturday.  If patient is medically ready, can admit to inpatient rehab in am.  Call me for questions [phone].

## 2015-03-09 NOTE — Addendum Note (Signed)
Addendum  created 03/09/15 1250 by Montez Hageman, MD   Modules edited: Clinical Notes   Clinical Notes:  File: [id number]

## 2015-03-09 NOTE — Progress Notes (Signed)
ANTICOAGULATION CONSULT NOTE   Pharmacy Consult for Warfarin Indication: VTE prophylaxis  No Known Allergies  Patient Measurements: Height: 5\' 8"  (172.7 cm) Weight: 205 lb (92.987 kg) IBW/kg (Calculated) : 63.9  Vital Signs: Temp: 98.4 F (36.9 C) (03/18 1011) Temp Source: Oral (03/18 1011) BP: 110/67 mmHg (03/18 1011) Pulse Rate: 96 (03/18 1011)  Labs:  Recent Labs  03/08/15 0505 03/09/15 0700  HGB 10.5* 8.4*  HCT 31.6* 26.3*  PLT 160 126*  LABPROT 14.4 14.8  INR 1.11 1.15  CREATININE 0.44* 0.44*    Estimated Creatinine Clearance: 92.5 mL/min (by C-G formula based on Cr of 0.44).  Medications:  Scheduled:  . docusate sodium  100 mg Oral BID  . iron polysaccharides  150 mg Oral Daily  . letrozole  2.5 mg Oral Daily  . levothyroxine  300 mcg Oral QPM  . Warfarin - Pharmacist Dosing Inpatient   Does not apply q1800   Infusions:  . sodium chloride 50 mL/hr at 03/08/15 1024  . ropivacaine (PF) 2 mg/ml (0.2%) 12 mL/hr (03/09/15 0844)    Assessment: 67 yoF admitted 3/16 for bilateral total knee replacements.  PMH includes colon and breast cancer, hypothyroidism, hemorrhoids, varicose veins, and OA.  She had an epidural placed 3/16 at 1000, and this was removed on 3/18 at 1248, tip and site intact.  Pharmacy is consulted to dose warfarin for VTE prophylaxis post-op.    Today, 03/09/2015:  INR: 1.15, remains subtherapeutic as expected after first dose  CBC: Hgb decreased to 8.4 and Plt decreased to 126.    No bleeding or complications reported by RN or MD notes.  Diet: regular, eating 50-100% of meals.  Drug-drug interactions: none noted   Goal of Therapy:  INR 2-3 Monitor platelets by anticoagulation protocol: Yes   Plan:   Warfarin 7.5mg  PO today at 1800  Daily INR.  Noted MD plans to start Lovenox injections 12 hours after epidural removal, d/c when INR therapeutic.  Gretta Arab PharmD, BCPS Pager [phone] 03/09/2015 1:07 PM

## 2015-03-10 LAB — PROTIME-INR
INR: 1.28 (ref 0.00–1.49)
PROTHROMBIN TIME: 16.2 s — AB (ref 11.6–15.2)

## 2015-03-10 LAB — CBC
HEMATOCRIT: 22.6 % — AB (ref 36.0–46.0)
HEMOGLOBIN: 7.2 g/dL — AB (ref 12.0–15.0)
MCH: 29.6 pg (ref 26.0–34.0)
MCHC: 31.9 g/dL (ref 30.0–36.0)
MCV: 93 fL (ref 78.0–100.0)
PLATELETS: 124 10*3/uL — AB (ref 150–400)
RBC: 2.43 MIL/uL — AB (ref 3.87–5.11)
RDW: 13.5 % (ref 11.5–15.5)
WBC: 6 10*3/uL (ref 4.0–10.5)

## 2015-03-10 LAB — PREPARE RBC (CROSSMATCH)

## 2015-03-10 MED ORDER — DIPHENHYDRAMINE HCL 25 MG PO CAPS
25.0000 mg | ORAL_CAPSULE | Freq: Once | ORAL | Status: AC
  Administered 2015-03-10: 25 mg via ORAL
  Filled 2015-03-10: qty 1

## 2015-03-10 MED ORDER — SODIUM CHLORIDE 0.9 % IV SOLN
Freq: Once | INTRAVENOUS | Status: AC
  Administered 2015-03-11: 06:00:00 via INTRAVENOUS

## 2015-03-10 MED ORDER — ACETAMINOPHEN 325 MG PO TABS
650.0000 mg | ORAL_TABLET | Freq: Once | ORAL | Status: AC
  Administered 2015-03-10: 650 mg via ORAL
  Filled 2015-03-10: qty 2

## 2015-03-10 MED ORDER — WARFARIN SODIUM 7.5 MG PO TABS
7.5000 mg | ORAL_TABLET | Freq: Once | ORAL | Status: AC
  Administered 2015-03-10: 7.5 mg via ORAL
  Filled 2015-03-10: qty 1

## 2015-03-10 NOTE — Progress Notes (Signed)
ANTICOAGULATION CONSULT NOTE   Pharmacy Consult for Warfarin Indication: VTE prophylaxis  No Known Allergies  Patient Measurements: Height: 5\' 8"  (172.7 cm) Weight: 205 lb (92.987 kg) IBW/kg (Calculated) : 63.9  Vital Signs: Temp: 99.3 F (37.4 C) (03/19 0645) Temp Source: Oral (03/19 0645) BP: 122/57 mmHg (03/19 0645) Pulse Rate: 106 (03/19 0645)  Labs:  Recent Labs  03/08/15 0505 03/09/15 0700 03/10/15 0521  HGB 10.5* 8.4* 7.2*  HCT 31.6* 26.3* 22.6*  PLT 160 126* 124*  LABPROT 14.4 14.8 16.2*  INR 1.11 1.15 1.28  CREATININE 0.44* 0.44*  --     Estimated Creatinine Clearance: 92.5 mL/min (by C-G formula based on Cr of 0.44).  Medications:  Scheduled:  . sodium chloride   Intravenous Once  . acetaminophen  650 mg Oral Once  . diphenhydrAMINE  25 mg Oral Once  . docusate sodium  100 mg Oral BID  . enoxaparin (LOVENOX) injection  30 mg Subcutaneous Q12H  . iron polysaccharides  150 mg Oral Daily  . letrozole  2.5 mg Oral Daily  . levothyroxine  300 mcg Oral QPM  . Warfarin - Pharmacist Dosing Inpatient   Does not apply q1800   Infusions:  . sodium chloride 50 mL/hr at 03/09/15 2137    Assessment: 69 yoF admitted 3/16 for bilateral total knee replacements.  PMH includes colon and breast cancer, hypothyroidism, hemorrhoids, varicose veins, and OA.  She had an epidural placed 3/16 at 1000, and this was removed on 3/18 at 1248, tip and site intact.  Pharmacy is consulted to dose warfarin for VTE prophylaxis post-op.    Today, 03/10/2015:  INR: 1.28, remains subtherapeutic as expected after 2 doses  CBC: Hgb decreased to 7.2 and Plt decreased to 124.    No bleeding or complications reported by RN or MD notes.  Diet: regular, eating 50-100% of meals.  Drug-drug interactions: none noted  Epidural removed on 3/18 at ~ 1200   Goal of Therapy:  INR 2-3 Monitor platelets by anticoagulation protocol: Yes   Plan:   Warfarin 7.5mg  PO today at 1800  Daily  INR.  Noted MD started Lovenox injections 12 hours after epidural removed, d/c when INR therapeutic.  Dolly Rias RPh 03/10/2015, 10:26 AM Pager [phone]

## 2015-03-10 NOTE — Progress Notes (Signed)
Physical Therapy Treatment Patient Details Name: Jamie Martinez MRN: [id number] DOB: [birth year] Today's Date: 2015-03-31    History of Present Illness Pt is s/p Bil TKR    PT Comments    Therex performed.  OOB deferred this am - RN advises transfusion about to start with Hgb @ 7.2  Follow Up Recommendations  CIR     Equipment Recommendations  None recommended by PT    Recommendations for Other Services OT consult     Precautions / Restrictions Precautions Precautions: Knee;Fall Required Braces or Orthoses: Knee Immobilizer - Right;Knee Immobilizer - Left Knee Immobilizer - Right: Discontinue once straight leg raise with < 10 degree lag Knee Immobilizer - Left: Discontinue once straight leg raise with < 10 degree lag Restrictions Weight Bearing Restrictions: No Other Position/Activity Restrictions: WBAT    Mobility  Bed Mobility               General bed mobility comments: Deferred with transfusion to start shortly  Transfers                    Ambulation/Gait                 Stairs            Wheelchair Mobility    Modified Rankin (Stroke Patients Only)       Balance                                    Cognition Arousal/Alertness: Awake/alert Behavior During Therapy: WFL for tasks assessed/performed Overall Cognitive Status: Within Functional Limits for tasks assessed                      Exercises Total Joint Exercises Ankle Circles/Pumps: AROM;Both;Supine;20 reps Quad Sets: AROM;Both;Supine;20 reps Heel Slides: AAROM;Both;Supine;20 reps Straight Leg Raises: AAROM;Both;Supine;20 reps Goniometric ROM: AAROM at R knee -10- 35, L knee -10- 20    General Comments        Pertinent Vitals/Pain Pain Assessment: 0-10 Pain Score: 7  Pain Location: L knee with 5/10 R knee Pain Descriptors / Indicators: Aching;Sore Pain Intervention(s): Limited activity within patient's tolerance;Monitored during  session;Premedicated before session;Ice applied    Home Living                      Prior Function            PT Goals (current goals can now be found in the care plan section) Acute Rehab PT Goals Patient Stated Goal: to rehab and return to independence. PT Goal Formulation: With patient Time For Goal Achievement: 03/15/15 Potential to Achieve Goals: Good Progress towards PT goals: Progressing toward goals    Frequency  7X/week    PT Plan Current plan remains appropriate    Co-evaluation             End of Session   Activity Tolerance: Patient tolerated treatment well;Patient limited by pain Patient left: in bed;with call bell/phone within reach;with family/visitor present     Time: 1032-1100 PT Time Calculation (min) (ACUTE ONLY): 28 min  Charges:  $Therapeutic Exercise: 23-37 mins                    G Codes:      Derek Huneycutt 03-31-15, 12:33 PM

## 2015-03-10 NOTE — Progress Notes (Signed)
PT Cancellation Note  Patient Details Name: Jamie Martinez MRN: [id number] DOB: 1956/12/13   Cancelled Treatment:     PT pm session deferred at request of pt "just worn out".  Will follow in am.   Kodie Kishi 03/10/2015, 3:53 PM

## 2015-03-10 NOTE — Progress Notes (Signed)
   Subjective: 3 Days Post-Op Procedure(s) (LRB): BILATERAL TOTAL KNEE ARTHROPLASTY (Bilateral) Patient reports pain as moderate.   Plan is to go Rehab after hospital stay.  Objective: Vital signs in last 24 hours: Temp:  [98.4 F (36.9 C)-99.3 F (37.4 C)] 99.3 F (37.4 C) (03/19 0645) Pulse Rate:  [96-106] 106 (03/19 0645) Resp:  [16-17] 16 (03/19 0645) BP: (110-126)/(57-67) 122/57 mmHg (03/19 0645) SpO2:  [97 %-99 %] 97 % (03/19 0645)  Intake/Output from previous day:  Intake/Output Summary (Last 24 hours) at 03/10/15 0825 Last data filed at 03/10/15 0645  Gross per 24 hour  Intake   1680 ml  Output    975 ml  Net    705 ml    Intake/Output this shift:    Labs:  Recent Labs  03/08/15 0505 03/09/15 0700 03/10/15 0521  HGB 10.5* 8.4* 7.2*    Recent Labs  03/09/15 0700 03/10/15 0521  WBC 7.3 6.0  RBC 2.81* 2.43*  HCT 26.3* 22.6*  PLT 126* 124*    Recent Labs  03/08/15 0505 03/09/15 0700  NA 140 141  K 4.0 4.1  CL 105 106  CO2 26 31  BUN 9 12  CREATININE 0.44* 0.44*  GLUCOSE 145* 106*  CALCIUM 8.5 8.2*    Recent Labs  03/09/15 0700 03/10/15 0521  INR 1.15 1.28    EXAM General - Patient is Alert, Appropriate and Oriented Extremity - Neurologically intact Neurovascular intact No cellulitis present Compartment soft Dressing/Incision - clean, dry, no drainage Motor Function - intact, moving foot and toes well on exam.   Past Medical History  Diagnosis Date  . Hypothyroidism   . Arthritis   . Cancer 2010    left breast surgery and radiation ,  dec 2013 colon chemo done for colon    Assessment/Plan: 3 Days Post-Op Procedure(s) (LRB): BILATERAL TOTAL KNEE ARTHROPLASTY (Bilateral) Principal Problem:   OA (osteoarthritis) of knee   Up with therapy  Transfuse 2 units PRBCs for symptomatic blood loss anemia  DVT Prophylaxis - Lovenox and Coumadin Weight-Bearing as tolerated to bilateral legs  Akaash Vandewater V 03/10/2015, 8:25  AM

## 2015-03-11 ENCOUNTER — Inpatient Hospital Stay (HOSPITAL_COMMUNITY): Payer: PRIVATE HEALTH INSURANCE | Admitting: *Deleted

## 2015-03-11 ENCOUNTER — Inpatient Hospital Stay (HOSPITAL_COMMUNITY): Payer: PRIVATE HEALTH INSURANCE | Admitting: Physical Therapy

## 2015-03-11 DIAGNOSIS — D62 Acute posthemorrhagic anemia: Secondary | ICD-10-CM | POA: Diagnosis not present

## 2015-03-11 LAB — CBC
HCT: 31.1 % — ABNORMAL LOW (ref 36.0–46.0)
HEMOGLOBIN: 10.2 g/dL — AB (ref 12.0–15.0)
MCH: 30 pg (ref 26.0–34.0)
MCHC: 32.8 g/dL (ref 30.0–36.0)
MCV: 91.5 fL (ref 78.0–100.0)
Platelets: 141 10*3/uL — ABNORMAL LOW (ref 150–400)
RBC: 3.4 MIL/uL — AB (ref 3.87–5.11)
RDW: 14.3 % (ref 11.5–15.5)
WBC: 6.3 10*3/uL (ref 4.0–10.5)

## 2015-03-11 LAB — TYPE AND SCREEN
ABO/RH(D): A POS
Antibody Screen: NEGATIVE
Unit division: 0
Unit division: 0

## 2015-03-11 LAB — PROTIME-INR
INR: 1.46 (ref 0.00–1.49)
PROTHROMBIN TIME: 17.8 s — AB (ref 11.6–15.2)

## 2015-03-11 MED ORDER — WARFARIN SODIUM 7.5 MG PO TABS
7.5000 mg | ORAL_TABLET | Freq: Once | ORAL | Status: AC
  Administered 2015-03-11: 7.5 mg via ORAL
  Filled 2015-03-11: qty 1

## 2015-03-11 NOTE — Progress Notes (Signed)
Physical Therapy Treatment Patient Details Name: Jamie Martinez MRN: [id number] DOB: [birth year] Today's Date: 04/10/2015    History of Present Illness Pt is s/p Bil TKR    PT Comments    OOB deferred, pt requests rest following therex  Follow Up Recommendations  CIR     Equipment Recommendations  None recommended by PT    Recommendations for Other Services OT consult     Precautions / Restrictions Precautions Precautions: Knee;Fall Required Braces or Orthoses: Knee Immobilizer - Right;Knee Immobilizer - Left Knee Immobilizer - Right: Discontinue once straight leg raise with < 10 degree lag Knee Immobilizer - Left: Discontinue once straight leg raise with < 10 degree lag Restrictions Weight Bearing Restrictions: No Other Position/Activity Restrictions: WBAT    Mobility  Bed Mobility                  Transfers                    Ambulation/Gait                 Stairs            Wheelchair Mobility    Modified Rankin (Stroke Patients Only)       Balance                                    Cognition Arousal/Alertness: Awake/alert Behavior During Therapy: WFL for tasks assessed/performed Overall Cognitive Status: Within Functional Limits for tasks assessed                      Exercises Total Joint Exercises Ankle Circles/Pumps: AROM;Both;Supine;20 reps Quad Sets: AROM;Both;Supine;20 reps Heel Slides: AAROM;Both;Supine;20 reps Straight Leg Raises: AAROM;Both;Supine;20 reps Goniometric ROM: AAROM R knee -8 - 40, L knee -10 - 35    General Comments        Pertinent Vitals/Pain Pain Assessment: 0-10 Pain Score: 7  Pain Location: Bil knees Pain Descriptors / Indicators: Aching;Sore Pain Intervention(s): Limited activity within patient's tolerance;Monitored during session;Premedicated before session;Ice applied    Home Living                      Prior Function            PT Goals  (current goals can now be found in the care plan section) Acute Rehab PT Goals Patient Stated Goal: to rehab and return to independence. PT Goal Formulation: With patient Time For Goal Achievement: 03/15/15 Potential to Achieve Goals: Good Progress towards PT goals: Progressing toward goals    Frequency  7X/week    PT Plan Current plan remains appropriate    Co-evaluation             End of Session   Activity Tolerance: Patient tolerated treatment well;Patient limited by pain;Patient limited by fatigue Patient left: in bed;with call bell/phone within reach;with family/visitor present     Time: 6754-4920 PT Time Calculation (min) (ACUTE ONLY): 25 min  Charges:  $Therapeutic Exercise: 23-37 mins                    G Codes:      Dyan Creelman 10-Apr-2015, 10:58 AM

## 2015-03-11 NOTE — Progress Notes (Signed)
ANTICOAGULATION CONSULT NOTE   Pharmacy Consult for Warfarin Indication: VTE prophylaxis  No Known Allergies  Patient Measurements: Height: 5\' 8"  (172.7 cm) Weight: 205 lb (92.987 kg) IBW/kg (Calculated) : 63.9  Vital Signs: Temp: 99.7 F (37.6 C) (03/20 1400) Temp Source: Oral (03/20 1400) BP: 123/60 mmHg (03/20 1400) Pulse Rate: 116 (03/20 1400)  Labs:  Recent Labs  03/09/15 0700 03/10/15 0521 03/11/15 0452 03/11/15 0903  HGB 8.4* 7.2*  --  10.2*  HCT 26.3* 22.6*  --  31.1*  PLT 126* 124*  --  141*  LABPROT 14.8 16.2* 17.8*  --   INR 1.15 1.28 1.46  --   CREATININE 0.44*  --   --   --     Estimated Creatinine Clearance: 92.5 mL/min (by C-G formula based on Cr of 0.44).  Medications:  Scheduled:  . docusate sodium  100 mg Oral BID  . enoxaparin (LOVENOX) injection  30 mg Subcutaneous Q12H  . iron polysaccharides  150 mg Oral Daily  . letrozole  2.5 mg Oral Daily  . levothyroxine  300 mcg Oral QPM  . warfarin  7.5 mg Oral ONCE-1800  . Warfarin - Pharmacist Dosing Inpatient   Does not apply q1800   Infusions:  . sodium chloride 50 mL/hr at 03/09/15 2137    Assessment: 10 yoF admitted 3/16 for bilateral total knee replacements.  PMH includes colon and breast cancer, hypothyroidism, hemorrhoids, varicose veins, and OA.  She had an epidural placed 3/16 at 1000, and this was removed on 3/18 at 1248, tip and site intact.  Pharmacy is consulted to dose warfarin for VTE prophylaxis post-op.    Today, 03/11/2015:  INR: 1.46, remains subtherapeutic, but trending up appropriately  CBC: Hgb improved to 10.2 s/p 2 units PRBCs yesterday. Pltc improved to 141.    No bleeding or complications reported by RN or MD notes.  Diet: regular, eating 50-85% of meals.  Drug-drug interactions: Levothyroxine  Epidural removed on 3/18 at ~ 1200  Goal of Therapy:  INR 2-3 Monitor platelets by anticoagulation protocol: Yes   Plan:   Warfarin 7.5mg  PO today at 1800  Daily  PT/INR.  Noted MD started Lovenox 30mg  SQ q12h twelve hours after epidural removed, d/c when INR therapeutic.   Lindell Spar, PharmD, BCPS Pager: [phone] 03/11/2015 3:27 PM

## 2015-03-11 NOTE — Progress Notes (Signed)
Physical Therapy Treatment Patient Details Name: Jamie Martinez MRN: [id number] DOB: 19-Jun-1956 Today's Date: 03/11/2015    History of Present Illness Pt is s/p Bil TKR    PT Comments    Slow but steady progress with mobility.  Plans for dc to CIR tomorrow.  Follow Up Recommendations  CIR     Equipment Recommendations  None recommended by PT    Recommendations for Other Services OT consult     Precautions / Restrictions Precautions Precautions: Knee;Fall Required Braces or Orthoses: Knee Immobilizer - Right;Knee Immobilizer - Left Knee Immobilizer - Right: Discontinue once straight leg raise with < 10 degree lag Knee Immobilizer - Left: Discontinue once straight leg raise with < 10 degree lag Restrictions Weight Bearing Restrictions: No Other Position/Activity Restrictions: WBAT    Mobility  Bed Mobility Overal bed mobility: Needs Assistance Bed Mobility: Sit to Supine     Supine to sit: Min assist;Mod assist;+2 for physical assistance;+2 for safety/equipment Sit to supine: Mod assist   General bed mobility comments: cues for sequence, assist for bil LE management  Transfers Overall transfer level: Needs assistance Equipment used: Rolling walker (2 wheeled) Transfers: Sit to/from Stand Sit to Stand: Mod assist;+2 physical assistance;+2 safety/equipment Stand pivot transfers: Mod assist;+2 physical assistance;+2 safety/equipment       General transfer comment: assist to rise and steady, cues for hand placement and LE management. Assist to descend to chair and extend LEs out in front; stand pvt chair to [facility].  Ambulation/Gait Ambulation/Gait assistance: Mod assist;+2 physical assistance;+2 safety/equipment Ambulation Distance (Feet): 5 Feet Assistive device: Rolling walker (2 wheeled) Gait Pattern/deviations: Step-to pattern;Decreased step length - right;Decreased step length - left;Shuffle;Trunk flexed Gait velocity: decr   General Gait Details: cues for  posture, position from RW and sequence   Stairs            Wheelchair Mobility    Modified Rankin (Stroke Patients Only)       Balance                                    Cognition Arousal/Alertness: Awake/alert Behavior During Therapy: WFL for tasks assessed/performed Overall Cognitive Status: Within Functional Limits for tasks assessed                      Exercises Total Joint Exercises Ankle Circles/Pumps: AROM;Both;Supine;20 reps Quad Sets: AROM;Both;Supine;20 reps Heel Slides: AAROM;Both;Supine;20 reps Straight Leg Raises: AAROM;Both;Supine;20 reps Goniometric ROM: AAROM R knee -8 - 40, L knee -10 - 35    General Comments        Pertinent Vitals/Pain Pain Assessment: 0-10 Pain Score: 5  Pain Location: Bil knees Pain Descriptors / Indicators: Aching;Sore Pain Intervention(s): Limited activity within patient's tolerance;Monitored during session;Premedicated before session;Ice applied    Home Living                      Prior Function            PT Goals (current goals can now be found in the care plan section) Acute Rehab PT Goals Patient Stated Goal: to rehab and return to independence. PT Goal Formulation: With patient Time For Goal Achievement: 03/15/15 Potential to Achieve Goals: Good Progress towards PT goals: Progressing toward goals    Frequency  7X/week    PT Plan Current plan remains appropriate    Co-evaluation  End of Session Equipment Utilized During Treatment: Gait belt;Right knee immobilizer;Left knee immobilizer Activity Tolerance: Patient tolerated treatment well;Patient limited by pain;Patient limited by fatigue Patient left: in bed;with call bell/phone within reach     Time: 1108-1140 PT Time Calculation (min) (ACUTE ONLY): 32 min  Charges:  $Gait Training: 8-22 mins $Therapeutic Exercise: 23-37 mins $Therapeutic Activity: 8-22 mins                    G Codes:       Brilyn Tuller 29-Mar-2015, 12:28 PM

## 2015-03-11 NOTE — Progress Notes (Signed)
   Subjective: 4 Days Post-Op Procedure(s) (LRB): BILATERAL TOTAL KNEE ARTHROPLASTY (Bilateral) Patient reports pain as moderate.   Felt better sitting up post-transfusion Plan is to go Rehab after hospital stay.  Objective: Vital signs in last 24 hours: Temp:  [98.2 F (36.8 C)-99.9 F (37.7 C)] 99.1 F (37.3 C) (03/20 0500) Pulse Rate:  [78-108] 78 (03/20 0122) Resp:  [16-18] 18 (03/20 0500) BP: (122-131)/(56-71) 128/61 mmHg (03/20 0500) SpO2:  [95 %-100 %] 96 % (03/20 0500)  Intake/Output from previous day:  Intake/Output Summary (Last 24 hours) at 03/11/15 0803 Last data filed at 03/11/15 5521  Gross per 24 hour  Intake   2175 ml  Output   2250 ml  Net    -75 ml    Intake/Output this shift:    Labs:  Recent Labs  03/09/15 0700 03/10/15 0521  HGB 8.4* 7.2*    Recent Labs  03/09/15 0700 03/10/15 0521  WBC 7.3 6.0  RBC 2.81* 2.43*  HCT 26.3* 22.6*  PLT 126* 124*    Recent Labs  03/09/15 0700  NA 141  K 4.1  CL 106  CO2 31  BUN 12  CREATININE 0.44*  GLUCOSE 106*  CALCIUM 8.2*    Recent Labs  03/10/15 0521 03/11/15 0452  INR 1.28 1.46    EXAM General - Patient is Alert, Appropriate and Oriented Extremity - Neurologically intact Neurovascular intact No cellulitis present Compartment soft Dressing/Incision - clean, dry, no drainage Motor Function - intact, moving foot and toes well on exam.   Past Medical History  Diagnosis Date  . Hypothyroidism   . Arthritis   . Cancer 2010    left breast surgery and radiation ,  dec 2013 colon chemo done for colon    Assessment/Plan: 4 Days Post-Op Procedure(s) (LRB): BILATERAL TOTAL KNEE ARTHROPLASTY (Bilateral) Principal Problem:   OA (osteoarthritis) of knee   Up with therapy Plan for discharge tomorrow to CIR  DVT Prophylaxis - Lovenox and Coumadin Weight-Bearing as tolerated to bilateral legs  Henrry Feil V 03/11/2015, 8:03 AM

## 2015-03-11 NOTE — Progress Notes (Signed)
Physical Therapy Treatment Patient Details Name: Jamie Martinez MRN: [id number] DOB: 28-Jun-1956 Today's Date: 03/11/2015    History of Present Illness Pt is s/p Bil TKR    PT Comments    Pt progressing slowly with mobility - ltd by pain and elevated anxiety level  Follow Up Recommendations  CIR     Equipment Recommendations  None recommended by PT    Recommendations for Other Services OT consult     Precautions / Restrictions Precautions Precautions: Knee;Fall Required Braces or Orthoses: Knee Immobilizer - Right;Knee Immobilizer - Left Knee Immobilizer - Right: Discontinue once straight leg raise with < 10 degree lag Knee Immobilizer - Left: Discontinue once straight leg raise with < 10 degree lag Restrictions Weight Bearing Restrictions: No Other Position/Activity Restrictions: WBAT    Mobility  Bed Mobility Overal bed mobility: Needs Assistance;+2 for physical assistance;+ 2 for safety/equipment Bed Mobility: Supine to Sit     Supine to sit: Min assist;Mod assist;+2 for physical assistance;+2 for safety/equipment     General bed mobility comments: cues for sequence, assist for bil LE management  Transfers Overall transfer level: Needs assistance Equipment used: Rolling walker (2 wheeled) Transfers: Sit to/from Stand Sit to Stand: Mod assist;+2 physical assistance;+2 safety/equipment         General transfer comment: assist to rise and steady, cues for hand placement and LE management. Assist to descend to chair and extend LEs out in front.  Ambulation/Gait Ambulation/Gait assistance: Mod assist;+2 physical assistance;+2 safety/equipment Ambulation Distance (Feet): 4 Feet Assistive device: Rolling walker (2 wheeled) Gait Pattern/deviations: Step-to pattern;Decreased step length - right;Decreased step length - left;Shuffle Gait velocity: decr   General Gait Details: cues for posture, position from RW and sequence   Stairs            Wheelchair  Mobility    Modified Rankin (Stroke Patients Only)       Balance                                    Cognition Arousal/Alertness: Awake/alert Behavior During Therapy: WFL for tasks assessed/performed Overall Cognitive Status: Within Functional Limits for tasks assessed                      Exercises Total Joint Exercises Ankle Circles/Pumps: AROM;Both;Supine;20 reps Quad Sets: AROM;Both;Supine;20 reps Heel Slides: AAROM;Both;Supine;20 reps Straight Leg Raises: AAROM;Both;Supine;20 reps Goniometric ROM: AAROM R knee -8 - 40, L knee -10 - 35    General Comments        Pertinent Vitals/Pain Pain Assessment: 0-10 Pain Score: 7  Pain Location: Bil knees Pain Descriptors / Indicators: Aching;Sore Pain Intervention(s): Limited activity within patient's tolerance;Monitored during session;Patient requesting pain meds-RN notified;Ice applied    Home Living                      Prior Function            PT Goals (current goals can now be found in the care plan section) Acute Rehab PT Goals Patient Stated Goal: to rehab and return to independence. PT Goal Formulation: With patient Time For Goal Achievement: 03/15/15 Potential to Achieve Goals: Good Progress towards PT goals: Progressing toward goals    Frequency  7X/week    PT Plan Current plan remains appropriate    Co-evaluation             End of Session  Equipment Utilized During Treatment: Gait belt;Right knee immobilizer;Left knee immobilizer Activity Tolerance: Patient tolerated treatment well;Patient limited by pain;Patient limited by fatigue Patient left: in chair;with call bell/phone within reach;with family/visitor present     Time: 3244-0102 PT Time Calculation (min) (ACUTE ONLY): 23 min  Charges:  $Gait Training: 8-22 mins $Therapeutic Exercise: 23-37 mins $Therapeutic Activity: 8-22 mins                    G Codes:      Damaree Sargent March 22, 2015, 11:03  AM

## 2015-03-12 ENCOUNTER — Inpatient Hospital Stay (HOSPITAL_COMMUNITY)
Admission: RE | Admit: 2015-03-12 | Discharge: 2015-03-17 | DRG: 554 | Disposition: A | Discharge status: Home Health | Payer: PRIVATE HEALTH INSURANCE | Source: Intra-hospital | Attending: Physical Medicine & Rehabilitation | Admitting: Physical Medicine & Rehabilitation

## 2015-03-12 ENCOUNTER — Inpatient Hospital Stay (HOSPITAL_COMMUNITY): Payer: PRIVATE HEALTH INSURANCE | Admitting: Occupational Therapy

## 2015-03-12 ENCOUNTER — Inpatient Hospital Stay (HOSPITAL_COMMUNITY): Payer: PRIVATE HEALTH INSURANCE

## 2015-03-12 ENCOUNTER — Inpatient Hospital Stay (HOSPITAL_COMMUNITY): Payer: PRIVATE HEALTH INSURANCE | Admitting: Physical Therapy

## 2015-03-12 DIAGNOSIS — Z96653 Presence of artificial knee joint, bilateral: Secondary | ICD-10-CM | POA: Diagnosis present

## 2015-03-12 DIAGNOSIS — E039 Hypothyroidism, unspecified: Secondary | ICD-10-CM | POA: Diagnosis present

## 2015-03-12 DIAGNOSIS — K567 Ileus, unspecified: Secondary | ICD-10-CM

## 2015-03-12 DIAGNOSIS — Z86718 Personal history of other venous thrombosis and embolism: Secondary | ICD-10-CM | POA: Diagnosis not present

## 2015-03-12 DIAGNOSIS — M1710 Unilateral primary osteoarthritis, unspecified knee: Secondary | ICD-10-CM | POA: Diagnosis present

## 2015-03-12 DIAGNOSIS — M7989 Other specified soft tissue disorders: Secondary | ICD-10-CM | POA: Diagnosis not present

## 2015-03-12 DIAGNOSIS — I824Z2 Acute embolism and thrombosis of unspecified deep veins of left distal lower extremity: Secondary | ICD-10-CM | POA: Diagnosis present

## 2015-03-12 DIAGNOSIS — D62 Acute posthemorrhagic anemia: Secondary | ICD-10-CM

## 2015-03-12 DIAGNOSIS — K913 Postprocedural intestinal obstruction: Secondary | ICD-10-CM | POA: Diagnosis not present

## 2015-03-12 DIAGNOSIS — Z87891 Personal history of nicotine dependence: Secondary | ICD-10-CM

## 2015-03-12 DIAGNOSIS — R209 Unspecified disturbances of skin sensation: Secondary | ICD-10-CM | POA: Diagnosis present

## 2015-03-12 DIAGNOSIS — R112 Nausea with vomiting, unspecified: Secondary | ICD-10-CM | POA: Diagnosis not present

## 2015-03-12 DIAGNOSIS — M172 Bilateral post-traumatic osteoarthritis of knee: Secondary | ICD-10-CM | POA: Diagnosis not present

## 2015-03-12 DIAGNOSIS — M170 Bilateral primary osteoarthritis of knee: Principal | ICD-10-CM | POA: Diagnosis present

## 2015-03-12 DIAGNOSIS — M179 Osteoarthritis of knee, unspecified: Secondary | ICD-10-CM | POA: Diagnosis present

## 2015-03-12 DIAGNOSIS — K9189 Other postprocedural complications and disorders of digestive system: Secondary | ICD-10-CM

## 2015-03-12 LAB — COMPREHENSIVE METABOLIC PANEL
ALK PHOS: 59 U/L (ref 39–117)
ALT: 15 U/L (ref 0–35)
AST: 19 U/L (ref 0–37)
Albumin: 2.6 g/dL — ABNORMAL LOW (ref 3.5–5.2)
Anion gap: 8 (ref 5–15)
BUN: 10 mg/dL (ref 6–23)
CO2: 30 mmol/L (ref 19–32)
CREATININE: 0.55 mg/dL (ref 0.50–1.10)
Calcium: 8.4 mg/dL (ref 8.4–10.5)
Chloride: 97 mmol/L (ref 96–112)
GFR calc non Af Amer: >90 mL/min (ref >90)
GLUCOSE: 130 mg/dL — AB (ref 70–99)
Potassium: 4 mmol/L (ref 3.5–5.1)
SODIUM: 135 mmol/L (ref 135–145)
Total Bilirubin: 0.7 mg/dL (ref 0.3–1.2)
Total Protein: 5.5 g/dL — ABNORMAL LOW (ref 6.0–8.3)

## 2015-03-12 LAB — CBC WITH DIFFERENTIAL/PLATELET
BASOS ABS: 0 10*3/uL (ref 0.0–0.1)
Basophils Relative: 0 % (ref 0–1)
Eosinophils Absolute: 0.1 10*3/uL (ref 0.0–0.7)
Eosinophils Relative: 2 % (ref 0–5)
HCT: 30.3 % — ABNORMAL LOW (ref 36.0–46.0)
Hemoglobin: 10.1 g/dL — ABNORMAL LOW (ref 12.0–15.0)
LYMPHS PCT: 16 % (ref 12–46)
Lymphs Abs: 1 10*3/uL (ref 0.7–4.0)
MCH: 29.7 pg (ref 26.0–34.0)
MCHC: 33.3 g/dL (ref 30.0–36.0)
MCV: 89.1 fL (ref 78.0–100.0)
MONO ABS: 0.5 10*3/uL (ref 0.1–1.0)
Monocytes Relative: 8 % (ref 3–12)
NEUTROS PCT: 74 % (ref 43–77)
Neutro Abs: 4.5 10*3/uL (ref 1.7–7.7)
PLATELETS: 176 10*3/uL (ref 150–400)
RBC: 3.4 MIL/uL — ABNORMAL LOW (ref 3.87–5.11)
RDW: 13.6 % (ref 11.5–15.5)
WBC: 6.1 10*3/uL (ref 4.0–10.5)

## 2015-03-12 LAB — PROTIME-INR
INR: 1.61 — ABNORMAL HIGH (ref 0.00–1.49)
Prothrombin Time: 19.3 seconds — ABNORMAL HIGH (ref 11.6–15.2)

## 2015-03-12 MED ORDER — POLYSACCHARIDE IRON COMPLEX 150 MG PO CAPS: 150.0000 mg | ORAL_CAPSULE | Freq: Two times a day (BID) | ORAL | Status: DC

## 2015-03-12 MED ORDER — ONDANSETRON HCL 4 MG PO TABS: 4.0000 mg | ORAL_TABLET | Freq: Four times a day (QID) | ORAL | Status: DC | PRN

## 2015-03-12 MED ORDER — METOCLOPRAMIDE HCL 5 MG PO TABS: 5.0000 mg | ORAL_TABLET | Freq: Three times a day (TID) | ORAL | Status: DC | PRN

## 2015-03-12 MED ORDER — BISACODYL 10 MG RE SUPP: 10.0000 mg | Freq: Every day | RECTAL | Status: DC | PRN

## 2015-03-12 MED ORDER — METHOCARBAMOL 500 MG PO TABS
500.0000 mg | ORAL_TABLET | Freq: Four times a day (QID) | ORAL | Status: DC | PRN
  Administered 2015-03-12 – 2015-03-13 (×2): 500 mg via ORAL
  Filled 2015-03-12 (×2): qty 1

## 2015-03-12 MED ORDER — ENOXAPARIN SODIUM 30 MG/0.3ML ~~LOC~~ SOLN
30.0000 mg | Freq: Two times a day (BID) | SUBCUTANEOUS | Status: DC
  Administered 2015-03-12 – 2015-03-14 (×4): 30 mg via SUBCUTANEOUS
  Filled 2015-03-12 (×8): qty 0.3

## 2015-03-12 MED ORDER — WARFARIN SODIUM 5 MG PO TABS: 5.0000 mg | ORAL_TABLET | Freq: Every day | ORAL | Status: DC

## 2015-03-12 MED ORDER — WARFARIN - PHARMACIST DOSING INPATIENT: Freq: Every day | Status: DC

## 2015-03-12 MED ORDER — OXYCODONE HCL 5 MG PO TABS
5.0000 mg | ORAL_TABLET | ORAL | Status: DC | PRN
  Administered 2015-03-12 – 2015-03-13 (×4): 15 mg via ORAL
  Administered 2015-03-16 – 2015-03-17 (×2): 5 mg via ORAL
  Filled 2015-03-12 (×2): qty 3
  Filled 2015-03-12: qty 1
  Filled 2015-03-12: qty 3
  Filled 2015-03-12: qty 1
  Filled 2015-03-12 (×2): qty 3

## 2015-03-12 MED ORDER — ONDANSETRON HCL 4 MG/2ML IJ SOLN: 4.0000 mg | Freq: Four times a day (QID) | INTRAMUSCULAR | Status: DC | PRN

## 2015-03-12 MED ORDER — SORBITOL 70 % SOLN
30.0000 mL | Freq: Every day | Status: DC | PRN
  Administered 2015-03-12 – 2015-03-13 (×2): 30 mL via ORAL
  Filled 2015-03-12 (×3): qty 30

## 2015-03-12 MED ORDER — WARFARIN SODIUM 5 MG PO TABS: 5.0000 mg | ORAL_TABLET | Freq: Once | ORAL | Status: DC

## 2015-03-12 MED ORDER — METHOCARBAMOL 500 MG PO TABS: 500.0000 mg | ORAL_TABLET | Freq: Four times a day (QID) | ORAL | Status: DC | PRN

## 2015-03-12 MED ORDER — POLYSACCHARIDE IRON COMPLEX 150 MG PO CAPS
150.0000 mg | ORAL_CAPSULE | Freq: Every day | ORAL | Status: DC
  Administered 2015-03-13 – 2015-03-14 (×2): 150 mg via ORAL
  Filled 2015-03-12 (×3): qty 1

## 2015-03-12 MED ORDER — SENNOSIDES-DOCUSATE SODIUM 8.6-50 MG PO TABS
1.0000 | ORAL_TABLET | Freq: Every day | ORAL | Status: DC
  Administered 2015-03-12: 1 via ORAL
  Filled 2015-03-12 (×2): qty 1

## 2015-03-12 MED ORDER — POLYETHYLENE GLYCOL 3350 17 G PO PACK: 17.0000 g | PACK | Freq: Every day | ORAL | Status: DC | PRN

## 2015-03-12 MED ORDER — LEVOTHYROXINE SODIUM 150 MCG PO TABS
300.0000 ug | ORAL_TABLET | Freq: Every evening | ORAL | Status: DC
  Administered 2015-03-12 – 2015-03-16 (×5): 300 ug via ORAL
  Filled 2015-03-12 (×6): qty 2

## 2015-03-12 MED ORDER — DOCUSATE SODIUM 100 MG PO CAPS: 100.0000 mg | ORAL_CAPSULE | Freq: Two times a day (BID) | ORAL | Status: DC

## 2015-03-12 MED ORDER — ACETAMINOPHEN 325 MG PO TABS
325.0000 mg | ORAL_TABLET | ORAL | Status: DC | PRN
  Administered 2015-03-13 – 2015-03-17 (×7): 650 mg via ORAL
  Filled 2015-03-12 (×7): qty 2
  Filled 2015-03-12: qty 1
  Filled 2015-03-12 (×2): qty 2

## 2015-03-12 MED ORDER — WARFARIN SODIUM 5 MG PO TABS
5.0000 mg | ORAL_TABLET | Freq: Once | ORAL | Status: DC
  Filled 2015-03-12: qty 1

## 2015-03-12 MED ORDER — TRAMADOL HCL 50 MG PO TABS: 50.0000 mg | ORAL_TABLET | Freq: Four times a day (QID) | ORAL | Status: DC | PRN

## 2015-03-12 MED ORDER — OXYCODONE HCL 5 MG PO TABS: 5.0000 mg | ORAL_TABLET | ORAL | Status: DC | PRN

## 2015-03-12 MED ORDER — LETROZOLE 2.5 MG PO TABS
2.5000 mg | ORAL_TABLET | Freq: Every day | ORAL | Status: DC
Start: 2015-03-13 — End: 2015-03-17
  Administered 2015-03-13 – 2015-03-17 (×5): 2.5 mg via ORAL
  Filled 2015-03-12 (×7): qty 1

## 2015-03-12 MED ORDER — WARFARIN SODIUM 7.5 MG PO TABS
7.5000 mg | ORAL_TABLET | Freq: Once | ORAL | Status: AC
  Administered 2015-03-12: 7.5 mg via ORAL
  Filled 2015-03-12: qty 1

## 2015-03-12 MED ORDER — ONDANSETRON HCL 4 MG PO TABS
4.0000 mg | ORAL_TABLET | Freq: Four times a day (QID) | ORAL | Status: DC | PRN
  Administered 2015-03-13: 4 mg via ORAL
  Filled 2015-03-12: qty 1

## 2015-03-12 MED ORDER — ENOXAPARIN SODIUM 30 MG/0.3ML ~~LOC~~ SOLN: 30.0000 mg | Freq: Two times a day (BID) | SUBCUTANEOUS | Status: DC

## 2015-03-12 MED ORDER — TRAMADOL HCL 50 MG PO TABS
50.0000 mg | ORAL_TABLET | Freq: Four times a day (QID) | ORAL | Status: DC | PRN
  Administered 2015-03-13: 100 mg via ORAL
  Filled 2015-03-12 (×2): qty 1

## 2015-03-12 MED ORDER — POLYETHYLENE GLYCOL 3350 17 G PO PACK
17.0000 g | PACK | Freq: Two times a day (BID) | ORAL | Status: DC
  Administered 2015-03-12 – 2015-03-13 (×2): 17 g via ORAL
  Filled 2015-03-12 (×4): qty 1

## 2015-03-12 MED ORDER — DOCUSATE SODIUM 100 MG PO CAPS
100.0000 mg | ORAL_CAPSULE | Freq: Two times a day (BID) | ORAL | Status: DC
  Administered 2015-03-12 – 2015-03-17 (×10): 100 mg via ORAL
  Filled 2015-03-12 (×12): qty 1

## 2015-03-12 NOTE — Progress Notes (Signed)
[facility] Admission Coordinator Addendum Physical Medicine and Rehabilitation PMR Pre-admission 03/08/2015 9:28 AM  Related encounter: Admission (Discharged) from 03/07/2015 in [place] Collapse All   PMR Admission Coordinator Pre-Admission Assessment  Patient: Jamie Martinez is an 59 y.o., female MRN: [id number] DOB: 1956/02/07 Height: '5\' 8"'  (172.7 cm) Weight: 92.987 kg (205 lb)  Insurance Information HMO: PPO: PCP: IPA: 80/20: OTHER: Group # [id number] PRIMARY: [employer] Policy#: [id number] Subscriber: Ethelene Browns CM Name: Glynis Smiles. Phone#: [phone] Fax#: [phone] Pre-Cert#: Juel Burrow Received approval from Glynis Smiles on 03/12/15 for admission this date for 7 days with update due on 03/19/15  Employer: [employer] Benefits: Phone #: [phone] Name: Automated Eff. Date: 10/04/13 Deduct: $500 (met all) Out of Pocket Max: $3000 (Has met $340.14) Life Max: unlimited CIR: 80% SNF: 80% Outpatient: 80% Co-Pay: 20% Home Health: 80% Co-Pay: 20% DME: 80% Co-Pay: 20% Providers: in Therapist, art Information    Name Relation Home Work [place] [phone]  [phone]     Current Medical History  Patient Admitting Diagnosis: B TKR  History of Present Illness: A 59 y.o. right handed female with history of breast and colon cancer as well as remote tobacco abuse. Patient presented with end-stage osteoarthritis bilateral knees 10 years and failed nonsurgical conservative treatments including NSAIDS, corticosteroid injections and activity modification. Independent prior to admission living with her husband. Patient admitted 03/07/2015  undergoing elective bilateral total knee arthroplasty per Dr. Maureen Ralphs. Postoperative pain management with epidural catheter removed 03/09/2015. Weightbearing as tolerated. Placed on Coumadin for DVT prophylaxis 4 weeks total then begin aspirin 81 mg daily. Acute blood loss anemia 10.5 and monitored. Physical therapy evaluation completed 03/08/2015 and OT evaluation completed 03/09/15 with recommendations of physical medicine rehabilitation consult. Pt. Underwent transfusion of 2 units PRBCs on 03/10/15. Patient admitted for comprehensive inpatient rehabilitation program on 03/12/15.  Past Medical History  Past Medical History  Diagnosis Date  . Hypothyroidism   . Arthritis   . Cancer 2010    left breast surgery and radiation , dec 2013 colon chemo done for colon    Family History  family history is not on file.  Prior Rehab/Hospitalizations: No previous rehab.  Current Medications   Current facility-administered medications:  . 0.9 % sodium chloride infusion, , Intravenous, Continuous, Arlee Muslim, PA-C, Last Rate: 50 mL/hr at 03/08/15 1024 . acetaminophen (TYLENOL) tablet 650 mg, 650 mg, Oral, Q6H PRN, 650 mg at 03/09/15 2683 **OR** acetaminophen (TYLENOL) suppository 650 mg, 650 mg, Rectal, Q6H PRN, Gaynelle Arabian, MD . bisacodyl (DULCOLAX) suppository 10 mg, 10 mg, Rectal, Daily PRN, Gaynelle Arabian, MD . diphenhydrAMINE (BENADRYL) 12.5 MG/5ML elixir 12.5-25 mg, 12.5-25 mg, Oral, Q4H PRN, Gaynelle Arabian, MD . docusate sodium (COLACE) capsule 100 mg, 100 mg, Oral, BID, Gaynelle Arabian, MD, 100 mg at 03/09/15 4196 . iron polysaccharides (NIFEREX) capsule 150 mg, 150 mg, Oral, Daily, Arlee Muslim, PA-C, 150 mg at 03/09/15 2229 . letrozole [facility]) tablet 2.5 mg, 2.5 mg, Oral, Daily, Gaynelle Arabian, MD, 2.5 mg at 03/09/15 7989 . levothyroxine (SYNTHROID, LEVOTHROID) tablet 300 mcg, 300 mcg, Oral, QPM, Gaynelle Arabian, MD, 300 mcg at 03/08/15 1748 .  menthol-cetylpyridinium (CEPACOL) lozenge 3 mg, 1 lozenge, Oral, PRN **OR** phenol (CHLORASEPTIC) mouth spray 1 spray, 1 spray, Mouth/Throat, PRN, Gaynelle Arabian, MD . methocarbamol (ROBAXIN) tablet 500 mg, 500 mg, Oral, Q6H PRN, 500 mg at 03/09/15 1236 **OR** methocarbamol (ROBAXIN) 500 mg in  dextrose 5 % 50 mL IVPB, 500 mg, Intravenous, Q6H PRN, Gaynelle Arabian, MD, 500 mg at 03/07/15 1328 . metoCLOPramide (REGLAN) tablet 5-10 mg, 5-10 mg, Oral, Q8H PRN **OR** metoCLOPramide (REGLAN) injection 5-10 mg, 5-10 mg, Intravenous, Q8H PRN, Gaynelle Arabian, MD . morphine 2 MG/ML injection 1-2 mg, 1-2 mg, Intravenous, Q1H PRN, Gaynelle Arabian, MD, 2 mg at 03/09/15 0520 . ondansetron (ZOFRAN) tablet 4 mg, 4 mg, Oral, Q6H PRN **OR** ondansetron (ZOFRAN) injection 4 mg, 4 mg, Intravenous, Q6H PRN, Gaynelle Arabian, MD . oxyCODONE (Oxy IR/ROXICODONE) immediate release tablet 5-15 mg, 5-15 mg, Oral, Q3H PRN, Gaynelle Arabian, MD, 15 mg at 03/09/15 1236 . polyethylene glycol (MIRALAX / GLYCOLAX) packet 17 g, 17 g, Oral, Daily PRN, Gaynelle Arabian, MD . ropivacaine (PF) 2 mg/ml (0.2%) (NAROPIN) epidural, 12 mL/hr, Epidural, Continuous, Rod Mae, MD, Last Rate: 12 mL/hr at 03/09/15 0844, 12 mL/hr at 03/09/15 0844 . traMADol (ULTRAM) tablet 50-100 mg, 50-100 mg, Oral, Q6H PRN, Gaynelle Arabian, MD . Warfarin - Pharmacist Dosing Inpatient, , Does not apply, q1800, Randa Spike, RPH  Patients Current Diet: Diet regular  Precautions / Restrictions Precautions Precautions: Knee, Fall Restrictions Weight Bearing Restrictions: No Other Position/Activity Restrictions: WBAT   Prior Activity Level Community (5-7x/wk): Went out daily. Worked [employer] in [employer].   Home Assistive Devices / Equipment Home Assistive Devices/Equipment: Eyeglasses, Dentures (specify type)  Prior Functional Level Prior Function Level of Independence: Independent  Current Functional Level Cognition  Overall Cognitive Status: Within  Functional Limits for tasks assessed Orientation Level: Oriented X4   Extremity Assessment (includes Sensation/Coordination)  Upper Extremity Assessment: Overall WFL for tasks assessed  Lower Extremity Assessment: RLE deficits/detail, LLE deficits/detail RLE Deficits / Details: 3-/5 quads; AAROM at knee -10 - 40 LLE Deficits / Details: 2-/5 quads with AAROM at knee -10- 50; ltd sensation 2* epidural    ADLs  Overall ADL's : Needs assistance/impaired Eating/Feeding: Independent, Sitting Grooming: Wash/dry hands, Wash/dry face, Set up, Sitting Upper Body Bathing: Set up, Supervision/ safety, Sitting Lower Body Bathing: +2 for safety/equipment, +2 for physical assistance, Maximal assistance, Sit to/from stand Upper Body Dressing : Minimal assistance, Sitting Upper Body Dressing Details (indicate cue type and reason): with gown due to lines Lower Body Dressing: +2 for safety/equipment, +2 for physical assistance, Total assistance, Sit to/from stand Lower Body Dressing Details (indicate cue type and reason): attempted to reach down to straighten R sock but unable to get far enough to top of sock to adjust it herself.  Toilet Transfer: +2 for physical assistance, +2 for safety/equipment, Moderate assistance, RW Toilet Transfer Details (indicate cue type and reason): sit to stand and take several steps (almost to doorway) Toileting- Water quality scientist and Hygiene: +2 for safety/equipment, +2 for physical assistance, Total assistance, Sit to/from stand Toileting - Clothing Manipulation Details (indicate cue type and reason): +2 mod assist to stand and total assist due to pt unable to free UEs from walker to manage clothing, etc. General ADL Comments: Educated on AE options for LB self care and coverage. Pt began to feel a little lightheaded once she approached doorway with walker and therapist assisting so chair pulled up and assisted pt to sit.     Mobility  Overal bed mobility:  Needs Assistance, +2 for physical assistance, + 2 for safety/equipment Bed Mobility: Supine to Sit Supine to sit: Mod assist, +2 for physical assistance, +2 for safety/equipment Sit to supine: Mod assist, +2 for physical assistance, +2 for safety/equipment General bed mobility comments: asisst for  LEs, verbal cues for sequence    Transfers  Overall transfer level: Needs assistance Equipment used: Rolling walker (2 wheeled) Transfers: Sit to/from Stand Sit to Stand: +2 physical assistance, +2 safety/equipment, Mod assist General transfer comment: assist to rise and steady, cues for hand placement and LE management. Assist to descend to chair and extend LEs out in front.    Ambulation / Gait / Stairs / Wheelchair Mobility  Ambulation/Gait Ambulation/Gait assistance: Mod assist, +2 physical assistance, +2 safety/equipment Ambulation Distance (Feet): 7 Feet Assistive device: Rolling walker (2 wheeled) Gait Pattern/deviations: Decreased step length - right, Decreased step length - left, Step-to pattern, Shuffle, Trunk flexed Gait velocity: decr General Gait Details: cues for posture, position from RW and sequence    Posture / Balance      Special needs/care consideration BiPAP/CPAP No CPM Yes, Bilateral CPMs on acute Continuous Drip IV 0.9% NS 50 ml/hr Dialysis No  Life Vest No Oxygen Yes, on acute  Special Bed No Trach Size No Wound Vac (area) No  Skin B TKR incisions  Bowel mgmt: Last BM 03/07/15 Bladder mgmt: Voiding without incontinence Diabetic mgmt No    Previous Home Environment Living Arrangements: Spouse/significant other Home Care Services: No  Discharge Living Setting Plans for Discharge Living Setting: Patient's home, House, Lives with (comment) (Lives with husband and her 42 yo son.) Type of Home at Discharge: House Discharge Home Layout: Two level, Able to live on main level with bedroom/bathroom Alternate Level  Stairs-Number of Steps: Flight Discharge Home Access: Stairs to enter [employer] of Steps: 2 at front and 5 at back entrance. Usually enters at back entrance. Does the patient have any problems obtaining your medications?: No  Social/Family/Support Systems Patient Roles: Spouse, Parent (Has a husband and a 33 yo son.) Contact Information: Jerzey Komperda - spouse Anticipated Caregiver: husband, son and self Anticipated Caregiver's Contact Information: Shanon Brow - spouse (h) [phone] (c) [phone] Ability/Limitations of Caregiver: Husband works 3rd shift, son works 1st shift. Discharge Plan Discussed with Primary Caregiver: Yes Is Caregiver In Agreement with Plan?: Yes Does Caregiver/Family have Issues with Lodging/Transportation while Pt is in Rehab?: No  Goals/Additional Needs Patient/Family Goal for Rehab: PT/OT mod I goals Expected length of stay: 7 days Cultural Considerations: None Dietary Needs: Regular diet, thin liquids Equipment Needs: TBD Pt/Family Agrees to Admission and willing to participate: Yes Program Orientation Provided & Reviewed with Pt/Caregiver Including Roles & Responsibilities: Yes  Decrease burden of Care through IP rehab admission: N/A  Possible need for SNF placement upon discharge: Not planned  Patient Condition: This patient's medical and functional status has changed since the consult dated: 03/09/15 in which the Rehabilitation Physician determined and documented that the patient's condition is appropriate for intensive rehabilitative care in an inpatient rehabilitation facility. See "History of Present Illness" (above) for medical update. Functional changes are: Currently requiring mod assist +2 to ambulate 7 ft. RW. Patient's medical and functional status update has been discussed with the Rehabilitation physician and patient remains appropriate for inpatient rehabilitation. Admitted 03/12/15 to inpatient rehab.   Preadmission Screen Completed  By: Retta Diones, 03/09/2015 1:49 PM And Gerlean Ren 03/12/15 at 1246. ______________________________________________________________________  Discussed status with Dr. Naaman Plummer on 03/12/15 at 1247 and received telephone approval for admission tomorrow, Saturday.  Admission Coordinator: Retta Diones time1349/Date03/18/16   Gerlean Ren,  03/12/15 1246       Cosigned by: Meredith Staggers, MD at 03/12/2015 1:00 PM  Revision History

## 2015-03-12 NOTE — Progress Notes (Signed)
Charlett Blake, MD Physician Signed Physical Medicine and Rehabilitation Consult Note 03/08/2015 5:39 AM  Related encounter: Admission (Discharged) from 03/07/2015 in [place] Collapse All        Physical Medicine and Rehabilitation Consult Reason for Consult: Bilateral total knee arthroplasty secondary to end-stage osteoarthritis Referring Physician: Dr. Maureen Ralphs   HPI: Jamie Martinez is a 59 y.o. right handed female with history of breast and colon cancer as well as remote tobacco abuse. Patient presented with end-stage osteoarthritis bilateral knees 10 years and failed nonsurgical conservative treatments including NSAIDS, corticosteroid injections and activity modification. Independent prior to admission living with her husband. Patient admitted 03/07/2015 undergoing elective bilateral total knee arthroplasty per Dr. Maureen Ralphs. Postoperative pain management. Weightbearing as tolerated. Placed on Coumadin for DVT prophylaxis 4 weeks total then begin aspirin 81 mg daily. Acute blood loss anemia 10.5 and monitored. Physical therapy evaluation completed 03/08/2015 with recommendations of physical medicine rehabilitation consult.  Patient without breathing issues. Had some pain overnight. Still has epidural catheter in. Scheduled to have this out today. Both legs numb, Foley catheter in Review of Systems  Gastrointestinal: Positive for constipation.  Musculoskeletal: Positive for myalgias and joint pain.  All other systems reviewed and are negative.  Past Medical History  Diagnosis Date  . Hypothyroidism   . Arthritis   . Cancer 2010    left breast surgery and radiation , dec 2013 colon chemo done for colon   Past Surgical History  Procedure Laterality Date  . Breast surgery Left 2010    lumpectomy done, lymph nodes removed   . Abdominal hysterectomy      partial  . Colon surgery  dec 2013      surgery for colon cancer, part of bladder, part of pelvic wall removed appendix removed 1 ovary removed   . Appendectomy  dec 2013  . Knee arthroscopy Bilateral   . Total knee arthroplasty Bilateral 03/07/2015    Procedure: BILATERAL TOTAL KNEE ARTHROPLASTY; Surgeon: Gaynelle Arabian, MD; Location: [facility] ORS; Service: Orthopedics; Laterality: Bilateral; +epidural   History reviewed. No pertinent family history. Social History:  reports that she has never smoked. She has never used smokeless tobacco. She reports that she does not drink alcohol or use illicit drugs. Allergies: No Known Allergies Medications Prior to Admission  Medication Sig Dispense Refill  . acetaminophen (TYLENOL) 500 MG tablet Take 500 mg by mouth every 6 (six) hours as needed (Pain).    Marland Kitchen HYDROcodone-acetaminophen (NORCO/VICODIN) 5-325 MG per tablet Take 1-2 tablets by mouth every 6 (six) hours as needed for moderate pain.    Marland Kitchen letrozole (FEMARA) 2.5 MG tablet Take 2.5 mg by mouth daily.    Marland Kitchen levothyroxine (SYNTHROID, LEVOTHROID) 300 MCG tablet Take 300 mcg by mouth every evening.       Home: Home Living Family/patient expects to be discharged to:: Private residence Living Arrangements: Spouse/significant other  Functional History:   Functional Status:  Mobility:   Bed Mobility Overal bed mobility: Needs Assistance;+2 for physical assistance;+ 2 for safety/equipment Bed Mobility: Supine to Sit;Sit to Supine   Supine to sit: Mod assist;+2 for physical assistance;+2 for safety/equipment Sit to supine: Mod assist;+2 for physical assistance;+2 for safety/equipment  General bed mobility comments: cues for sequence and physical assist to manage LEs and to control trunk  Transfers Overall transfer level: Needs assistance Equipment used: Rolling walker (2 wheeled) Transfers: Sit to/from Stand Sit to Stand: +2 physical assistance;+2 safety/equipment;From elevated  surface;Mod  assist     General transfer comment: cues for LE management and use of UEs to self assist  Ambulation/Gait Ambulation/Gait assistance: Mod assist;+2 physical assistance;+2 safety/equipment Ambulation Distance (Feet): 2 Feet Assistive device: Rolling walker (2 wheeled) Gait Pattern/deviations: Step-to pattern;Decreased step length - right;Decreased step length - left;Shuffle;Trunk flexed   General Gait Details: 2 steps only to align with bedside - cues for sequence and assist for support/balance and to advance L LE  Stairs       Wheelchair Mobility        ADL:    Cognition: Cognition Orientation Level: Oriented X4    Blood pressure 97/57, pulse 82, temperature 98.1 F (36.7 C), temperature source Oral, resp. rate 16, height 5\' 8"  (1.727 m), weight 92.987 kg (205 lb), SpO2 97 %. Physical Exam  Constitutional: She appears well-developed.  HENT:  Head: Normocephalic.  Eyes: EOM are normal.  Neck: Normal range of motion. Neck supple. No thyromegaly present.  Cardiovascular: Normal rate and regular rhythm.  Respiratory: Effort normal and breath sounds normal. No respiratory distress.  GI: Soft. Bowel sounds are normal. She exhibits no distension.  Neurological:  Patient is a bit lethargic but arousable. She is oriented 3. Follows commands.  Skin:  Bilateral knee incisions are dressed appropriately tender  Motor strength is 5/5 bilateral deltoids, biceps, triceps, grip 2 minus bilateral hip flexors and knee extensors 3 minus ankle dorsiflexors Sensation paresthesias bilateral feet to light touch   Lab Results Last 24 Hours    Results for orders placed or performed during the hospital encounter of 03/07/15 (from the past 24 hour(s))  ABO/Rh Status: None   Collection Time: 03/07/15 9:00 AM  Result Value Ref Range   ABO/RH(D) A POS   Type and screen Status: None   Collection Time: 03/07/15 9:10 AM    Result Value Ref Range   ABO/RH(D) A POS    Antibody Screen NEG    Sample Expiration 03/10/2015   CBC Status: Abnormal   Collection Time: 03/08/15 5:05 AM  Result Value Ref Range   WBC 8.6 4.0 - 10.5 K/uL   RBC 3.44 (L) 3.87 - 5.11 MIL/uL   Hemoglobin 10.5 (L) 12.0 - 15.0 g/dL   HCT 31.6 (L) 36.0 - 46.0 %   MCV 91.9 78.0 - 100.0 fL   MCH 30.5 26.0 - 34.0 pg   MCHC 33.2 30.0 - 36.0 g/dL   RDW 13.3 11.5 - 15.5 %   Platelets 160 150 - 400 K/uL  Protime-INR Status: None   Collection Time: 03/08/15 5:05 AM  Result Value Ref Range   Prothrombin Time 14.4 11.6 - 15.2 seconds   INR 1.11 0.00 - 1.49      Imaging Results (Last 48 hours)    No results found.    Assessment/Plan: Diagnosis: End-stage osteoarthritis of the knees status post bilateral TKR postoperative day #2 1. Does the need for close, 24 hr/day medical supervision in concert with the patient's rehab needs make it unreasonable for this patient to be served in a less intensive setting? Yes 2. Co-Morbidities requiring supervision/potential complications: Postoperative pain, postoperative bowel and bladder issues, anticoagulation 3. Due to bladder management, bowel management, safety, skin/wound care, disease management, medication administration, pain management and patient education, does the patient require 24 hr/day rehab nursing? Yes 4. Does the patient require coordinated care of a physician, rehab nurse, PT (1-2 hrs/day, 5 days/week) and OT (1-2 hrs/day, 5 days/week) to address physical and functional deficits in the context of the above medical  diagnosis(es)? Yes Addressing deficits in the following areas: balance, endurance, locomotion, strength, transferring, bathing, dressing, grooming and toileting 5. Can the patient actively participate in an intensive therapy program of at least 3 hrs of therapy per day at least 5 days per week?  Yes 6. The potential for patient to make measurable gains while on inpatient rehab is excellent 7. Anticipated functional outcomes upon discharge from inpatient rehab are modified independent with PT, modified independent with OT, n/a with SLP. 8. Estimated rehab length of stay to reach the above functional goals is: 7 days 9. Does the patient have adequate social supports and living environment to accommodate these discharge functional goals? Yes 10. Anticipated D/C setting: Home 11. Anticipated post D/C treatments: [place] therapy 12. Overall Rehab/Functional Prognosis: excellent  RECOMMENDATIONS: This patient's condition is appropriate for continued rehabilitative care in the following setting: CIR Patient has agreed to participate in recommended program. Yes Note that insurance prior authorization may be required for reimbursement for recommended care.  Comment: Need to see how patient does up with PT after epidural catheter removed    03/08/2015

## 2015-03-12 NOTE — H&P (Signed)
Physical Medicine and Rehabilitation Admission H&P   Chief complaint: Knee pain  HPI: Jamie Martinez is a 59 y.o. right handed female with history of breast and colon cancer as well as remote tobacco abuse. Patient presented with end-stage osteoarthritis bilateral knees 10 years and failed nonsurgical conservative treatments including NSAIDS, corticosteroid injections and activity modification. Independent prior to admission living with her husband. Patient admitted 03/07/2015 undergoing elective bilateral total knee arthroplasty per Dr. Maureen Ralphs. Postoperative pain management with epidural catheter removed 03/09/2015. Weightbearing as tolerated. Placed on Coumadin for DVT prophylaxis 4 weeks total then begin aspirin 81 mg daily. Acute blood loss anemia 7.2 and transfused. Physical therapy evaluation completed 03/08/2015 with recommendations of physical medicine rehabilitation consult. Patient was admitted for comprehensive rehabilitation program   ROS Review of Systems  Gastrointestinal: Positive for constipation.  Musculoskeletal: Positive for myalgias and joint pain.  All other systems reviewed and are negative  Past Medical History  Diagnosis Date  . Hypothyroidism   . Arthritis   . Cancer 2010    left breast surgery and radiation , dec 2013 colon chemo done for colon   Past Surgical History  Procedure Laterality Date  . Breast surgery Left 2010    lumpectomy done, lymph nodes removed   . Abdominal hysterectomy      partial  . Colon surgery  dec 2013    surgery for colon cancer, part of bladder, part of pelvic wall removed appendix removed 1 ovary removed   . Appendectomy  dec 2013  . Knee arthroscopy Bilateral   . Total knee arthroplasty Bilateral 03/07/2015    Procedure: BILATERAL TOTAL KNEE ARTHROPLASTY; Surgeon: Gaynelle Arabian, MD; Location: [facility]; Service: Orthopedics; Laterality: Bilateral; +epidural    History reviewed. No pertinent family history. Social History:  reports that she has never smoked. She has never used smokeless tobacco. She reports that she does not drink alcohol or use illicit drugs. Allergies: No Known Allergies Medications Prior to Admission  Medication Sig Dispense Refill  . acetaminophen (TYLENOL) 500 MG tablet Take 500 mg by mouth every 6 (six) hours as needed (Pain).    Marland Kitchen HYDROcodone-acetaminophen (NORCO/VICODIN) 5-325 MG per tablet Take 1-2 tablets by mouth every 6 (six) hours as needed for moderate pain.    Marland Kitchen letrozole (FEMARA) 2.5 MG tablet Take 2.5 mg by mouth daily.    Marland Kitchen levothyroxine (SYNTHROID, LEVOTHROID) 300 MCG tablet Take 300 mcg by mouth every evening.       Home: Home Living Family/patient expects to be discharged to:: Inpatient rehab Living Arrangements: Spouse/significant other  Functional History: Prior Function Level of Independence: Independent  Functional Status:  Mobility: Bed Mobility Overal bed mobility: Needs Assistance Bed Mobility: Sit to Supine   Supine to sit: Min assist;Mod assist;+2 for physical assistance;+2 for safety/equipment Sit to supine: Mod assist  General bed mobility comments: cues for sequence, assist for bil LE management  Transfers Overall transfer level: Needs assistance Equipment used: Rolling walker (2 wheeled) Transfers: Sit to/from Stand Sit to Stand: Mod assist;+2 physical assistance;+2 safety/equipment Stand pivot transfers: Mod assist;+2 physical assistance;+2 safety/equipment    General transfer comment: assist to rise and steady, cues for hand placement and LE management. Assist to descend to chair and extend LEs out in front; stand pvt chair to [facility].  Ambulation/Gait Ambulation/Gait assistance: Mod assist;+2 physical assistance;+2 safety/equipment Ambulation Distance (Feet): 5 Feet Assistive device: Rolling walker (2 wheeled) Gait Pattern/deviations:  Step-to pattern;Decreased step length - right;Decreased step length - left;Shuffle;Trunk flexed Gait velocity:  decr  General Gait Details: cues for posture, position from RW and sequence      ADL:    Cognition: Cognition Overall Cognitive Status: Within Functional Limits for tasks assessed Orientation Level: Oriented X4 Cognition Arousal/Alertness: Awake/alert Behavior During Therapy: WFL for tasks assessed/performed Overall Cognitive Status: Within Functional Limits for tasks assessed  Physical Exam: Blood pressure 110/67, pulse 96, temperature 98.4 F (36.9 C), temperature source Oral, resp. rate 17, height '5\' 8"'  (1.727 m), weight 92.987 kg (205 lb), SpO2 99 %. Physical Exam Constitutional: She appears well-developed.  HENT: oral mucosa pink and moist Head: Normocephalic.  Eyes: EOM are normal.  Neck: Normal range of motion. Neck supple. No thyromegaly present.  Cardiovascular: Normal rate and regular rhythm. no murmur Respiratory: Effort normal and breath sounds normal. No respiratory distress. no wheezes GI: Soft. Bowel sounds are normal. She exhibits no distension.  Skin:  Bilateral knee incisions are dressed appropriately tender.  Mild erythema at incisions Neuro: Motor strength is 5/5 bilateral deltoids, biceps, triceps, grip 2 minus bilateral hip flexors and knee extensors 3 + to 4/5 ankle dorsiflexors Sensation paresthesias bilateral feet to light touch. Cognitively appropriate Psych: pleasant and appropriate    Lab Results Last 48 Hours    Results for orders placed or performed during the hospital encounter of 03/07/15 (from the past 48 hour(s))  CBC Status: Abnormal   Collection Time: 03/08/15 5:05 AM  Result Value Ref Range   WBC 8.6 4.0 - 10.5 K/uL   RBC 3.44 (L) 3.87 - 5.11 MIL/uL   Hemoglobin 10.5 (L) 12.0 - 15.0 g/dL   HCT 31.6 (L) 36.0 - 46.0 %   MCV 91.9 78.0 - 100.0 fL   MCH 30.5 26.0 - 34.0 pg    MCHC 33.2 30.0 - 36.0 g/dL   RDW 13.3 11.5 - 15.5 %   Platelets 160 150 - 400 K/uL  Basic metabolic panel Status: Abnormal   Collection Time: 03/08/15 5:05 AM  Result Value Ref Range   Sodium 140 135 - 145 mmol/L   Potassium 4.0 3.5 - 5.1 mmol/L   Chloride 105 96 - 112 mmol/L   CO2 26 19 - 32 mmol/L   Glucose, Bld 145 (H) 70 - 99 mg/dL   BUN 9 6 - 23 mg/dL   Creatinine, Ser 0.44 (L) 0.50 - 1.10 mg/dL   Calcium 8.5 8.4 - 10.5 mg/dL   GFR calc non Af Amer >90 >90 mL/min   GFR calc Af Amer >90 >90 mL/min    Comment: (NOTE) The eGFR has been calculated using the CKD EPI equation. This calculation has not been validated in all clinical situations. eGFR's persistently <90 mL/min signify possible Chronic Kidney Disease.    Anion gap 9 5 - 15  Protime-INR Status: None   Collection Time: 03/08/15 5:05 AM  Result Value Ref Range   Prothrombin Time 14.4 11.6 - 15.2 seconds   INR 1.11 0.00 - 1.49  CBC Status: Abnormal   Collection Time: 03/09/15 7:00 AM  Result Value Ref Range   WBC 7.3 4.0 - 10.5 K/uL   RBC 2.81 (L) 3.87 - 5.11 MIL/uL   Hemoglobin 8.4 (L) 12.0 - 15.0 g/dL    Comment: REPEATED TO VERIFY DELTA CHECK NOTED    HCT 26.3 (L) 36.0 - 46.0 %   MCV 93.6 78.0 - 100.0 fL   MCH 29.9 26.0 - 34.0 pg   MCHC 31.9 30.0 - 36.0 g/dL   RDW 13.6 11.5 - 15.5 %   Platelets 126 (  L) 150 - 400 K/uL    Comment: REPEATED TO VERIFY SPECIMEN CHECKED FOR CLOTS DELTA CHECK NOTED   Basic metabolic panel Status: Abnormal   Collection Time: 03/09/15 7:00 AM  Result Value Ref Range   Sodium 141 135 - 145 mmol/L   Potassium 4.1 3.5 - 5.1 mmol/L   Chloride 106 96 - 112 mmol/L   CO2 31 19 - 32 mmol/L   Glucose, Bld 106 (H) 70 - 99 mg/dL   BUN 12 6 - 23 mg/dL   Creatinine, Ser 0.44 (L) 0.50 - 1.10 mg/dL   Calcium  8.2 (L) 8.4 - 10.5 mg/dL   GFR calc non Af Amer >90 >90 mL/min   GFR calc Af Amer >90 >90 mL/min    Comment: (NOTE) The eGFR has been calculated using the CKD EPI equation. This calculation has not been validated in all clinical situations. eGFR's persistently <90 mL/min signify possible Chronic Kidney Disease.    Anion gap 4 (L) 5 - 15  Protime-INR Status: None   Collection Time: 03/09/15 7:00 AM  Result Value Ref Range   Prothrombin Time 14.8 11.6 - 15.2 seconds   INR 1.15 0.00 - 1.49      Imaging Results (Last 48 hours)    No results found.       Medical Problem List and Plan: 1. Functional deficits secondary to end-stage osteoarthritis status post bilateral total knee arthroplasty 03/07/2015 2. DVT Prophylaxis/Anticoagulation: Coumadin for DVT prophylaxis. Monitor any bleeding episodes. Check vascular studies 3. Pain Management: Robaxin and oxycodone as needed. Monitor with increased mobility 4. Acute blood loss anemia. S/p blood transfusion. Follow-up CBC. Continue Niferex daily 5. Neuropsych: This patient is capable of making decisions on her own behalf. 6. Skin/Wound Care: Routine skin checks 7. Fluids/Electrolytes/Nutrition: Strict I&O with follow-up chemistry 8. Hypothyroidism. Synthroid 9. Constipation: needs bm today. Passing gas currently     Post Admission Physician Evaluation: 1. Functional deficits secondary to OA of biliateral knees, s/p bilateral TKA's. 2. Patient is admitted to receive collaborative, interdisciplinary care between the physiatrist, rehab nursing staff, and therapy team. 3. Patient's level of medical complexity and substantial therapy needs in context of that medical necessity cannot be provided at a lesser intensity of care such as a SNF. 4. Patient has experienced substantial functional loss from his/her baseline which was documented above under the "Functional History" and "Functional Status"  headings. Judging by the patient's diagnosis, physical exam, and functional history, the patient has potential for functional progress which will result in measurable gains while on inpatient rehab. These gains will be of substantial and practical use upon discharge in facilitating mobility and self-care at the household level. 5. Physiatrist will provide 24 hour management of medical needs as well as oversight of the therapy plan/treatment and provide guidance as appropriate regarding the interaction of the two. 6. 24 hour rehab nursing will assist with bladder management, bowel management, safety, skin/wound care, disease management, medication administration, pain management and patient education and help integrate therapy concepts, techniques,education, etc. 7. PT will assess and treat for/with: Lower extremity strength, range of motion, stamina, balance, functional mobility, safety, adaptive techniques and equipment, pain mgt, knee rom, quad strength. Goals are: mod I. 8. OT will assess and treat for/with: ADL's, functional mobility, safety, upper extremity strength, adaptive techniques and equipment, pain mgt, balance. Goals are: mod I. Therapy may not yet proceed with showering this patient. 9. SLP will assess and treat for/with: n/a. Goals are: n/a. 10. Case Management and Education officer, museum  will assess and treat for psychological issues and discharge planning. 11. Team conference will be held weekly to assess progress toward goals and to determine barriers to discharge. 12. Patient will receive at least 3 hours of therapy per day at least 5 days per week. 13. ELOS: 7 days  14. Prognosis: excellent     Meredith Staggers, MD, [facility] 03/12/2015

## 2015-03-12 NOTE — Discharge Summary (Signed)
Physician Discharge Summary   Patient ID: KEYONA EMRICH MRN: [id number] DOB/AGE: May 29, 1956 59 y.o.  Admit date: 03/07/2015 Discharge date: 03-12-2015  Primary Diagnosis:  Osteoarthritis Bilateral knee(s) Admission Diagnoses:  Past Medical History  Diagnosis Date  . Hypothyroidism   . Arthritis   . Cancer 2010    left breast surgery and radiation ,  dec 2013 colon chemo done for colon   Discharge Diagnoses:   Principal Problem:   OA (osteoarthritis) of knee Active Problems:   Postoperative anemia due to acute blood loss  Estimated body mass index is 31.18 kg/(m^2) as calculated from the following:   Height as of this encounter: '5\' 8"'  (1.727 m).   Weight as of this encounter: 92.987 kg (205 lb).  Procedure:  Procedure(s) (LRB): BILATERAL TOTAL KNEE ARTHROPLASTY (Bilateral)   Consults: [facility]  HPI: LIBI CORSO is a 59 y.o. year old female with end stage OA of both knees with progressively worsening pain and dysfunction. He has constant pain, with activity and at rest and significant functional deficits with difficulties even with ADLs. He has had extensive non-op management including analgesics, injections of cortisone and viscosupplements, and home exercise program, but remains in significant pain with significant dysfunction. We discussed replacing both knees in the same setting versus one at a time including procedure, risks, potential complications, rehab course, and pros and cons associated with each and the patient elects to do both knees at the same time. She presents now for bilateral Total Knee Arthroplasty.  Laboratory Data: Admission on 03/07/2015  Component Date Value Ref Range Status  . ABO/RH(D) 03/07/2015 A POS   Final  . Antibody Screen 03/07/2015 NEG   Final  . Sample Expiration 03/07/2015 03/10/2015   Final  . Unit Number 03/07/2015 [id number]   Final  . Blood Component Type 03/07/2015 RED CELLS,LR   Final  . Unit division 03/07/2015 00    Final  . Status of Unit 03/07/2015 ISSUED,FINAL   Final  . Transfusion Status 03/07/2015 OK TO TRANSFUSE   Final  . Crossmatch Result 03/07/2015 Compatible   Final  . Unit Number 03/07/2015 [id number]   Final  . Blood Component Type 03/07/2015 RED CELLS,LR   Final  . Unit division 03/07/2015 00   Final  . Status of Unit 03/07/2015 ISSUED,FINAL   Final  . Transfusion Status 03/07/2015 OK TO TRANSFUSE   Final  . Crossmatch Result 03/07/2015 Compatible   Final  . ABO/RH(D) 03/07/2015 A POS   Final  . WBC 03/08/2015 8.6  4.0 - 10.5 K/uL Final  . RBC 03/08/2015 3.44* 3.87 - 5.11 MIL/uL Final  . Hemoglobin 03/08/2015 10.5* 12.0 - 15.0 g/dL Final  . HCT 03/08/2015 31.6* 36.0 - 46.0 % Final  . MCV 03/08/2015 91.9  78.0 - 100.0 fL Final  . MCH 03/08/2015 30.5  26.0 - 34.0 pg Final  . MCHC 03/08/2015 33.2  30.0 - 36.0 g/dL Final  . RDW 03/08/2015 13.3  11.5 - 15.5 % Final  . Platelets 03/08/2015 160  150 - 400 K/uL Final  . Sodium 03/08/2015 140  135 - 145 mmol/L Final  . Potassium 03/08/2015 4.0  3.5 - 5.1 mmol/L Final  . Chloride 03/08/2015 105  96 - 112 mmol/L Final  . CO2 03/08/2015 26  19 - 32 mmol/L Final  . Glucose, Bld 03/08/2015 145* 70 - 99 mg/dL Final  . BUN 03/08/2015 9  6 - 23 mg/dL Final  . Creatinine, Ser 03/08/2015 0.44* 0.50 - 1.10 mg/dL  Final  . Calcium 03/08/2015 8.5  8.4 - 10.5 mg/dL Final  . GFR calc non Af Amer 03/08/2015 >90  >90 mL/min Final  . GFR calc Af Amer 03/08/2015 >90  >90 mL/min Final   Comment: (NOTE) The eGFR has been calculated using the CKD EPI equation. This calculation has not been validated in all clinical situations. eGFR's persistently <90 mL/min signify possible Chronic Kidney Disease.   . Anion gap 03/08/2015 9  5 - 15 Final  . Prothrombin Time 03/08/2015 14.4  11.6 - 15.2 seconds Final  . INR 03/08/2015 1.11  0.00 - 1.49 Final  . WBC 03/09/2015 7.3  4.0 - 10.5 K/uL Final  . RBC 03/09/2015 2.81* 3.87 - 5.11 MIL/uL Final  . Hemoglobin  03/09/2015 8.4* 12.0 - 15.0 g/dL Final   Comment: REPEATED TO VERIFY DELTA CHECK NOTED   . HCT 03/09/2015 26.3* 36.0 - 46.0 % Final  . MCV 03/09/2015 93.6  78.0 - 100.0 fL Final  . MCH 03/09/2015 29.9  26.0 - 34.0 pg Final  . MCHC 03/09/2015 31.9  30.0 - 36.0 g/dL Final  . RDW 03/09/2015 13.6  11.5 - 15.5 % Final  . Platelets 03/09/2015 126* 150 - 400 K/uL Final   Comment: REPEATED TO VERIFY SPECIMEN CHECKED FOR CLOTS DELTA CHECK NOTED   . Sodium 03/09/2015 141  135 - 145 mmol/L Final  . Potassium 03/09/2015 4.1  3.5 - 5.1 mmol/L Final  . Chloride 03/09/2015 106  96 - 112 mmol/L Final  . CO2 03/09/2015 31  19 - 32 mmol/L Final  . Glucose, Bld 03/09/2015 106* 70 - 99 mg/dL Final  . BUN 03/09/2015 12  6 - 23 mg/dL Final  . Creatinine, Ser 03/09/2015 0.44* 0.50 - 1.10 mg/dL Final  . Calcium 03/09/2015 8.2* 8.4 - 10.5 mg/dL Final  . GFR calc non Af Amer 03/09/2015 >90  >90 mL/min Final  . GFR calc Af Amer 03/09/2015 >90  >90 mL/min Final   Comment: (NOTE) The eGFR has been calculated using the CKD EPI equation. This calculation has not been validated in all clinical situations. eGFR's persistently <90 mL/min signify possible Chronic Kidney Disease.   . Anion gap 03/09/2015 4* 5 - 15 Final  . Prothrombin Time 03/09/2015 14.8  11.6 - 15.2 seconds Final  . INR 03/09/2015 1.15  0.00 - 1.49 Final  . WBC 03/10/2015 6.0  4.0 - 10.5 K/uL Final  . RBC 03/10/2015 2.43* 3.87 - 5.11 MIL/uL Final  . Hemoglobin 03/10/2015 7.2* 12.0 - 15.0 g/dL Final  . HCT 03/10/2015 22.6* 36.0 - 46.0 % Final  . MCV 03/10/2015 93.0  78.0 - 100.0 fL Final  . MCH 03/10/2015 29.6  26.0 - 34.0 pg Final  . MCHC 03/10/2015 31.9  30.0 - 36.0 g/dL Final  . RDW 03/10/2015 13.5  11.5 - 15.5 % Final  . Platelets 03/10/2015 124* 150 - 400 K/uL Final  . Prothrombin Time 03/10/2015 16.2* 11.6 - 15.2 seconds Final  . INR 03/10/2015 1.28  0.00 - 1.49 Final  . Order Confirmation 03/10/2015 ORDER PROCESSED BY BLOOD BANK    Final  . Prothrombin Time 03/11/2015 17.8* 11.6 - 15.2 seconds Final  . INR 03/11/2015 1.46  0.00 - 1.49 Final  . WBC 03/11/2015 6.3  4.0 - 10.5 K/uL Final  . RBC 03/11/2015 3.40* 3.87 - 5.11 MIL/uL Final  . Hemoglobin 03/11/2015 10.2* 12.0 - 15.0 g/dL Final   Comment: DELTA CHECK NOTED POST TRANSFUSION SPECIMEN   . HCT 03/11/2015 31.1* 36.0 -  46.0 % Final  . MCV 03/11/2015 91.5  78.0 - 100.0 fL Final  . MCH 03/11/2015 30.0  26.0 - 34.0 pg Final  . MCHC 03/11/2015 32.8  30.0 - 36.0 g/dL Final  . RDW 03/11/2015 14.3  11.5 - 15.5 % Final  . Platelets 03/11/2015 141* 150 - 400 K/uL Final  . Prothrombin Time 03/12/2015 19.3* 11.6 - 15.2 seconds Final  . INR 03/12/2015 1.61* 0.00 - 1.49 Final  Hospital Outpatient Visit on 03/01/2015  Component Date Value Ref Range Status  . MRSA, PCR 03/01/2015 NEGATIVE  NEGATIVE Final  . Staphylococcus aureus 03/01/2015 NEGATIVE  NEGATIVE Final   Comment:        The Xpert SA Assay (FDA approved for NASAL specimens in patients over 41 years of age), is one component of a comprehensive surveillance program.  Test performance has been validated by [facility] for patients greater than or equal to 37 year old. It is not intended to diagnose infection nor to guide or monitor treatment. Performed at [facility]   . Color, Urine 03/01/2015 YELLOW  YELLOW Final  . APPearance 03/01/2015 CLEAR  CLEAR Final  . Specific Gravity, Urine 03/01/2015 1.013  1.005 - 1.030 Final  . pH 03/01/2015 6.5  5.0 - 8.0 Final  . Glucose, UA 03/01/2015 NEGATIVE  NEGATIVE mg/dL Final  . Hgb urine dipstick 03/01/2015 NEGATIVE  NEGATIVE Final  . Bilirubin Urine 03/01/2015 NEGATIVE  NEGATIVE Final  . Ketones, ur 03/01/2015 NEGATIVE  NEGATIVE mg/dL Final  . Protein, ur 03/01/2015 NEGATIVE  NEGATIVE mg/dL Final  . Urobilinogen, UA 03/01/2015 1.0  0.0 - 1.0 mg/dL Final  . Nitrite 03/01/2015 NEGATIVE  NEGATIVE Final  . Leukocytes, UA 03/01/2015 NEGATIVE  NEGATIVE Final     MICROSCOPIC NOT DONE ON URINES WITH NEGATIVE PROTEIN, BLOOD, LEUKOCYTES, NITRITE, OR GLUCOSE <1000 mg/dL.  Marland Kitchen aPTT 03/01/2015 36  24 - 37 seconds Final  . WBC 03/01/2015 5.4  4.0 - 10.5 K/uL Final  . RBC 03/01/2015 4.03  3.87 - 5.11 MIL/uL Final  . Hemoglobin 03/01/2015 12.1  12.0 - 15.0 g/dL Final  . HCT 03/01/2015 37.2  36.0 - 46.0 % Final  . MCV 03/01/2015 92.3  78.0 - 100.0 fL Final  . MCH 03/01/2015 30.0  26.0 - 34.0 pg Final  . MCHC 03/01/2015 32.5  30.0 - 36.0 g/dL Final  . RDW 03/01/2015 13.2  11.5 - 15.5 % Final  . Platelets 03/01/2015 206  150 - 400 K/uL Final  . Sodium 03/01/2015 143  135 - 145 mmol/L Final  . Potassium 03/01/2015 4.0  3.5 - 5.1 mmol/L Final  . Chloride 03/01/2015 106  96 - 112 mmol/L Final  . CO2 03/01/2015 29  19 - 32 mmol/L Final  . Glucose, Bld 03/01/2015 101* 70 - 99 mg/dL Final  . BUN 03/01/2015 14  6 - 23 mg/dL Final  . Creatinine, Ser 03/01/2015 0.56  0.50 - 1.10 mg/dL Final  . Calcium 03/01/2015 9.3  8.4 - 10.5 mg/dL Final  . Total Protein 03/01/2015 6.8  6.0 - 8.3 g/dL Final  . Albumin 03/01/2015 4.0  3.5 - 5.2 g/dL Final  . AST 03/01/2015 20  0 - 37 U/L Final  . ALT 03/01/2015 15  0 - 35 U/L Final  . Alkaline Phosphatase 03/01/2015 68  39 - 117 U/L Final  . Total Bilirubin 03/01/2015 0.5  0.3 - 1.2 mg/dL Final  . GFR calc non Af Amer 03/01/2015 >90  >90 mL/min Final  . GFR  calc Af Amer 03/01/2015 >90  >90 mL/min Final   Comment: (NOTE) The eGFR has been calculated using the CKD EPI equation. This calculation has not been validated in all clinical situations. eGFR's persistently <90 mL/min signify possible Chronic Kidney Disease.   . Anion gap 03/01/2015 8  5 - 15 Final  . Prothrombin Time 03/01/2015 12.9  11.6 - 15.2 seconds Final  . INR 03/01/2015 0.96  0.00 - 1.49 Final     X-Rays:No results found.  EKG:No orders found for this or any previous visit.   Hospital Course: Patient was admitted to [facility] and taken to the OR  and underwent the above stated procedure well without complications.  Patient tolerated the procedure well and was later transferred to the recovery room and then to the orthopaedic floor for postoperative care. Anesthesia was consulted postoperatively to place an epidural in for postoperative pain management. The patient was also given PO and IV analgesics for pain control following their surgery.  They were given 24 hours of postoperative antibiotics and started on DVT prophylaxis in the form of Lovenox and Coumadin after the epidural had been removed.   PT and OT were ordered for total joint protocol.  Discharge planning consulted to help with postop disposition and equipment needs.  Patient had a very good night on the evening of surgery and started to get up OOB with therapy on day one.  Hemovac drains were pulled without difficulty on day one.  Continued to work with therapy into day two.  Dressings were changed on day two and both incisions were healing well.  The epidural was removed without difficulty by Anesthesia on day two.  By day three, the patient started to show progress with therapy and given 2 units PRBCs for symptomatic blood loss anemia with a HGB of 7.2.   They continued to receive therapy each day for continued total knee protocol.  The incisions were healing well.  They continued to progress on day four and the HGB was noted ot have  Come back up to 10.2.  By day five, the patient was seen in rounds by Dr. Wynelle Link and was ready to go to Rehab.   Take Coumadin for 4 weeks and then discontinue.  The dose may need to be adjusted based upon the INR.  Please follow the INR and titrate Coumadin dose for a therapeutic range between 2.0 and 3.0 INR.  After completing the 4 weeks of Coumadin, the patient may stop the Coumadin and then take an 81 mg Aspirin daily for four more weeks.  Continue Lovenox injections until the INR is therapeutic at or greater than 2.0.  When INR reaches the therapeutic  level of equal to or greater than 2.0, the patient may discontinue the Lovenox injections.  Discharge to Rehab Diet - Regular diet Follow up - in 2 weeks Activity - WBAT Disposition - Rehab Condition Upon Discharge - Stable D/C Meds - See DC Summary DVT Prophylaxis - Lovenox and Coumadin  Discharge Instructions    Call MD / Call 911    Complete by:  As directed   If you experience chest pain or shortness of breath, CALL 911 and be transported to the hospital emergency room.  If you develope a fever above 101 F, pus (white drainage) or increased drainage or redness at the wound, or calf pain, call your surgeon's office.     Change dressing    Complete by:  As directed   Change dressing daily  with sterile 4 x 4 inch gauze dressing and apply TED hose. Do not submerge the incision under water.     Constipation Prevention    Complete by:  As directed   Drink plenty of fluids.  Prune juice may be helpful.  You may use a stool softener, such as Colace (over the counter) 100 mg twice a day.  Use MiraLax (over the counter) for constipation as needed.     Diet general    Complete by:  As directed      Discharge instructions    Complete by:  As directed   Pick up stool softner and laxative for home use following surgery while on pain medications. Do not submerge incision under water. Please use good hand washing techniques while changing dressing each day. May shower starting three days after surgery. Please use a clean towel to pat the incision dry following showers. Continue to use ice for pain and swelling after surgery. Do not use any lotions or creams on the incision until instructed by your surgeon.  Take Coumadin for a total of 4 weeks and then discontinue.  The dose may need to be adjusted based upon the INR.  Please follow the INR and titrate Coumadin dose for a therapeutic range between 2.0 and 3.0 INR.  After completing the 4 weeks of Coumadin, the patient may stop the Coumadin and  then take an 81 mg Aspirin daily for four more weeks.     Do not put a pillow under the knee. Place it under the heel.    Complete by:  As directed      Do not sit on low chairs, stoools or toilet seats, as it may be difficult to get up from low surfaces    Complete by:  As directed      Driving restrictions    Complete by:  As directed   No driving until released by the physician.     Increase activity slowly as tolerated    Complete by:  As directed      Lifting restrictions    Complete by:  As directed   No lifting until released by the physician.     Patient may shower    Complete by:  As directed   You may shower without a dressing once there is no drainage.  Do not wash over the wound.  If drainage remains, do not shower until drainage stops.     TED hose    Complete by:  As directed   Use stockings (TED hose) for 3 weeks on both leg(s).  You may remove them at night for sleeping.     Weight bearing as tolerated    Complete by:  As directed   Laterality:  bilateral  Extremity:  Lower            Medication List    STOP taking these medications        HYDROcodone-acetaminophen 5-325 MG per tablet  Commonly known as:  NORCO/VICODIN      TAKE these medications        acetaminophen 500 MG tablet  Commonly known as:  TYLENOL  Take 500 mg by mouth every 6 (six) hours as needed (Pain).     bisacodyl 10 MG suppository  Commonly known as:  DULCOLAX  Place 1 suppository (10 mg total) rectally daily as needed for moderate constipation.     docusate sodium 100 MG capsule  Commonly known as:  COLACE  Take 1  capsule (100 mg total) by mouth 2 (two) times daily.     enoxaparin 30 MG/0.3ML injection  Commonly known as:  LOVENOX  Inject 0.3 mLs (30 mg total) into the skin every 12 (twelve) hours. Continue Lovenox injections until the INR is therapeutic at or greater than 2.0.  When INR reaches the therapeutic level of equal to or greater than 2.0, the patient may discontinue  the Lovenox injections.     iron polysaccharides 150 MG capsule  Commonly known as:  NIFEREX  Take 1 capsule (150 mg total) by mouth 2 (two) times daily.     letrozole 2.5 MG tablet  Commonly known as:  FEMARA  Take 2.5 mg by mouth daily.     levothyroxine 300 MCG tablet  Commonly known as:  SYNTHROID, LEVOTHROID  Take 300 mcg by mouth every evening.     methocarbamol 500 MG tablet  Commonly known as:  ROBAXIN  Take 1 tablet (500 mg total) by mouth every 6 (six) hours as needed for muscle spasms.     metoCLOPramide 5 MG tablet  Commonly known as:  REGLAN  Take 1-2 tablets (5-10 mg total) by mouth every 8 (eight) hours as needed for nausea (if ondansetron (ZOFRAN) ineffective.).     ondansetron 4 MG tablet  Commonly known as:  ZOFRAN  Take 1 tablet (4 mg total) by mouth every 6 (six) hours as needed for nausea.     oxyCODONE 5 MG immediate release tablet  Commonly known as:  Oxy IR/ROXICODONE  Take 1-3 tablets (5-15 mg total) by mouth every 3 (three) hours as needed for moderate pain, severe pain or breakthrough pain.     polyethylene glycol packet  Commonly known as:  MIRALAX / GLYCOLAX  Take 17 g by mouth daily as needed for mild constipation.     traMADol 50 MG tablet  Commonly known as:  ULTRAM  Take 1-2 tablets (50-100 mg total) by mouth every 6 (six) hours as needed (m,ild pain).     warfarin 5 MG tablet  Commonly known as:  COUMADIN  Take 1 tablet (5 mg total) by mouth daily. Take Coumadin for a total of 4 weeks and then discontinue.  The dose may need to be adjusted based upon the INR.  Please follow the INR and titrate Coumadin dose for a therapeutic range between 2.0 and 3.0 INR.  After completing the 4 weeks of Coumadin, the patient may stop the Coumadin and then take an 81 mg Aspirin daily for four more weeks.           Follow-up Information    Follow up with Gearlean Alf, MD. Schedule an appointment as soon as possible for a visit in 2 weeks.    Specialty:  Orthopedic Surgery   Why:  Call office at [phone] to set up appointment with Dr. Wynelle Link.   Contact information:   [address] [place] [address] [phone]       Signed: Arlee Muslim, PA-C Orthopaedic Surgery 03/12/2015, 7:07 AM

## 2015-03-12 NOTE — Progress Notes (Signed)
Patient ID: Jamie Martinez, female   DOB: Oct 01, 1956, 59 y.o.   MRN: [id number] Patient admitted to 4M04 via bed, escorted by nursing staff and spouse.  Patient and spouse verbalized understanding of rehab process, signed safety agreement.  Will continue to monitor.  Brita Romp, RN

## 2015-03-12 NOTE — Progress Notes (Signed)
ANTICOAGULATION CONSULT NOTE   Pharmacy Consult for warfarin Indication: VTE prophylaxis  No Known Allergies  Patient Measurements: Height: 5\' 8"  (172.7 cm) Weight: 205 lb (92.987 kg) IBW/kg (Calculated) : 63.9  Vital Signs: Temp: 98.2 F (36.8 C) (03/21 0517) Temp Source: Oral (03/21 0517) BP: 110/69 mmHg (03/21 0517) Pulse Rate: 89 (03/21 0517)  Labs:  Recent Labs  03/10/15 0521 03/11/15 0452 03/11/15 0903 03/12/15 0442  HGB 7.2*  --  10.2*  --   HCT 22.6*  --  31.1*  --   PLT 124*  --  141*  --   LABPROT 16.2* 17.8*  --  19.3*  INR 1.28 1.46  --  1.61*    Estimated Creatinine Clearance: 92.5 mL/min (by C-G formula based on Cr of 0.44).  Medications:  Scheduled:  . docusate sodium  100 mg Oral BID  . enoxaparin (LOVENOX) injection  30 mg Subcutaneous Q12H  . iron polysaccharides  150 mg Oral Daily  . letrozole  2.5 mg Oral Daily  . levothyroxine  300 mcg Oral QPM  . Warfarin - Pharmacist Dosing Inpatient   Does not apply q1800   Infusions:  . sodium chloride 50 mL/hr at 03/09/15 2137    Assessment: 50 yoF admitted 3/16 for bilateral total knee replacements.  PMH includes colon and breast cancer, hypothyroidism, hemorrhoids, varicose veins, and OA.  She had an epidural placed 3/16 at 1000, and this was removed on 3/18 at 1248, tip and site intact.  Pharmacy is consulted to dose warfarin for VTE prophylaxis post-op.    Today, 03/12/2015:  INR trending up appropriately, not yet therapeutic.  (See MAR for warfarin doses administered).  CBC (3/20): Hgb improved to 10.2 s/p 2 units PRBCs 3/19. Pltc improved.    No bleeding or complications reported by RN or ortho notes.  Diet: regular, eating 15-50% of meals.  Drug-drug interactions: levothyroxine, but if euthyroid on stable dosage effect should be minimal.  Plan for discharge to CIR.  Goal of Therapy:  INR 2-3    Plan:   Warfarin 5 mg PO today at 1800  Daily PT/INR.  As per ortho orders, Lovenox  30mg  SQ q12h, discontinue when INR 2.0 or higher.  Clayburn Pert, PharmD, BCPS Pager: [phone] 03/12/2015  7:55 AM

## 2015-03-12 NOTE — Progress Notes (Signed)
   Subjective: 5 Days Post-Op Procedure(s) (LRB): BILATERAL TOTAL KNEE ARTHROPLASTY (Bilateral) Patient reports pain as mild.   Patient seen in rounds with Dr. Wynelle Link. Patient is well, but has had some minor complaints of pain in the knees, requiring pain medications Patient is ready to go to CIR  Objective: Vital signs in last 24 hours: Temp:  [98.2 F (36.8 C)-99.7 F (37.6 C)] 98.2 F (36.8 C) (03/21 0517) Pulse Rate:  [89-116] 89 (03/21 0517) Resp:  [16-20] 20 (03/21 0517) BP: (103-123)/(56-69) 110/69 mmHg (03/21 0517) SpO2:  [92 %-98 %] 92 % (03/21 0517)  Intake/Output from previous day:  Intake/Output Summary (Last 24 hours) at 03/12/15 0657 Last data filed at 03/11/15 2200  Gross per 24 hour  Intake    960 ml  Output    750 ml  Net    210 ml    Intake/Output this shift: Total I/O In: 240 [P.O.:240] Out: -   Labs:  Recent Labs  03/09/15 0700 03/10/15 0521 03/11/15 0903  HGB 8.4* 7.2* 10.2*    Recent Labs  03/10/15 0521 03/11/15 0903  WBC 6.0 6.3  RBC 2.43* 3.40*  HCT 22.6* 31.1*  PLT 124* 141*    Recent Labs  03/09/15 0700  NA 141  K 4.1  CL 106  CO2 31  BUN 12  CREATININE 0.44*  GLUCOSE 106*  CALCIUM 8.2*    Recent Labs  03/11/15 0452 03/12/15 0442  INR 1.46 1.61*    EXAM: General - Patient is Alert and Appropriate Extremity - Neurovascular intact Sensation intact distally Incisions - clean, dry, no drainage, healing Motor Function - intact, moving feet and toes well on exam.   Assessment/Plan: 5 Days Post-Op Procedure(s) (LRB): BILATERAL TOTAL KNEE ARTHROPLASTY (Bilateral) Procedure(s) (LRB): BILATERAL TOTAL KNEE ARTHROPLASTY (Bilateral) Past Medical History  Diagnosis Date  . Hypothyroidism   . Arthritis   . Cancer 2010    left breast surgery and radiation ,  dec 2013 colon chemo done for colon   Principal Problem:   OA (osteoarthritis) of knee Active Problems:   Postoperative anemia due to acute blood  loss  Estimated body mass index is 31.18 kg/(m^2) as calculated from the following:   Height as of this encounter: 5\' 8"  (1.727 m).   Weight as of this encounter: 92.987 kg (205 lb). Up with therapy Discharge to Rehab Diet - Regular diet Follow up - in 2 weeks Activity - WBAT Disposition - Rehab Condition Upon Discharge - Stable D/C Meds - See DC Summary DVT Prophylaxis - Lovenox and Coumadin  Arlee Muslim, PA-C Orthopaedic Surgery 03/12/2015, 6:57 AM

## 2015-03-12 NOTE — Progress Notes (Signed)
ANTICOAGULATION CONSULT NOTE   Pharmacy Consult for warfarin Indication: VTE prophylaxis  No Known Allergies  Patient Measurements: Height: 5\' 8"  (172.7 cm) Weight: 213 lb 14.4 oz (97.024 kg) IBW/kg (Calculated) : 63.9  Vital Signs: Temp: 99 F (37.2 C) (03/21 1447) Temp Source: Oral (03/21 1447) BP: 105/55 mmHg (03/21 1447) Pulse Rate: 108 (03/21 1447)  Labs:  Recent Labs  03/10/15 0521 03/11/15 0452 03/11/15 0903 03/12/15 0442  HGB 7.2*  --  10.2*  --   HCT 22.6*  --  31.1*  --   PLT 124*  --  141*  --   LABPROT 16.2* 17.8*  --  19.3*  INR 1.28 1.46  --  1.61*    Estimated Creatinine Clearance: 94.4 mL/min (by C-G formula based on Cr of 0.44).  Medications:  Scheduled:  . docusate sodium  100 mg Oral BID  . enoxaparin (LOVENOX) injection  30 mg Subcutaneous Q12H  . [START ON 03/13/2015] iron polysaccharides  150 mg Oral Daily  . [START ON 03/13/2015] letrozole  2.5 mg Oral Daily  . levothyroxine  300 mcg Oral QPM  . polyethylene glycol  17 g Oral BID  . senna-docusate  1 tablet Oral QHS  . warfarin  5 mg Oral ONCE-1800  . Warfarin - Pharmacist Dosing Inpatient   Does not apply q1800   Assessment: 71 yoF admitted 3/16 for bilateral total knee replacements.  PMH includes colon and breast cancer, hypothyroidism, hemorrhoids, varicose veins, and OA. Pharmacy is consulted to dose warfarin for VTE prophylaxis post-op - plan for 4 weeks treatment. Pt continues on Lovenox 30mg  SQ q12h bridge.    Today, 03/12/2015:  INR trending up appropriately, not yet therapeutic.  (See MAR for warfarin doses administered).  CBC (3/20): Hgb improved to 10.2 s/p 2 units PRBCs 3/19. Pltc improved.    No bleeding or complications reported by RN or ortho notes.  Goal of Therapy:  INR 2-3  Plan:   Warfarin 7.5 mg PO today at 1800  Daily PT/INR.  As per ortho orders, Lovenox 30mg  SQ q12h, discontinue when INR 2.0 or higher.  Sherlon Handing, PharmD, BCPS Clinical pharmacist,  pager [phone] 03/12/2015  3:46 PM

## 2015-03-12 NOTE — Progress Notes (Signed)
Physical Therapy Treatment Patient Details Name: Jamie Martinez MRN: [id number] DOB: 1956-04-18 Today's Date: 03/12/2015    History of Present Illness Pt is s/p Bil TKR    PT Comments    Patient able to ambulate today!. Tolerated well. Plans CIR today.  Follow Up Recommendations  CIR     Equipment Recommendations  None recommended by PT    Recommendations for Other Services       Precautions / Restrictions Precautions Precautions: Knee;Fall Required Braces or Orthoses: Knee Immobilizer - Right;Knee Immobilizer - Left Knee Immobilizer - Right: Discontinue once straight leg raise with < 10 degree lag Knee Immobilizer - Left: Discontinue once straight leg raise with < 10 degree lag    Mobility  Bed Mobility Overal bed mobility: Independent Bed Mobility: Supine to Sit     Supine to sit: Min assist Sit to supine: Min assist   General bed mobility comments: cues for sequence, assist for bil LE management  Transfers Overall transfer level: Needs assistance Equipment used: Rolling walker (2 wheeled) Transfers: Sit to/from Stand Sit to Stand: Mod assist;+2 physical assistance;+2 safety/equipment Stand pivot transfers: Mod assist;+2 physical assistance;+2 safety/equipment       General transfer comment: patient  able to lift self up with BIL UE's on chair arms and walk legs back, then requires  lifting assist to rise to  RW and shift forward, able to descend to seat by walking legs out and sitting, needs assist  to hold legs up to slide back into recliner.  Ambulation/Gait Ambulation/Gait assistance: Mod assist;+2 physical assistance;+2 safety/equipment Ambulation Distance (Feet): 25 Feet Assistive device: Rolling walker (2 wheeled) Gait Pattern/deviations: Step-to pattern;Trunk flexed;Decreased step length - right;Decreased step length - left Gait velocity: decr   General Gait Details: cues for posture, position from RW and sequence   Stairs             Wheelchair Mobility    Modified Rankin (Stroke Patients Only)       Balance                                    Cognition Arousal/Alertness: Awake/alert                          Exercises      General Comments        Pertinent Vitals/Pain Pain Location: bil knees Pain Descriptors / Indicators: Aching;Discomfort Pain Intervention(s): Limited activity within patient's tolerance;Monitored during session (due meds in 30", ok to walk anyway\)    Home Living                      Prior Function            PT Goals (current goals can now be found in the care plan section) Progress towards PT goals: Progressing toward goals    Frequency  7X/week    PT Plan Current plan remains appropriate    Co-evaluation             End of Session Equipment Utilized During Treatment: Gait belt;Right knee immobilizer;Left knee immobilizer Activity Tolerance: Patient tolerated treatment well Patient left: in chair;with call bell/phone within reach;with family/visitor present     Time: 1217-     Charges:  $Gait Training: 8-22 mins  G Codes:      [name] PT [phone]  03/12/2015, 2:08 PM

## 2015-03-12 NOTE — Progress Notes (Signed)
RN called report to Angie at 4MW.   All questions answered.   Patient transferred to CIR via [place].

## 2015-03-12 NOTE — Progress Notes (Signed)
Inpatient Rehabilitation  I have received insurance authorization this am to admit Ms. Deyo to IP rehab.  I received approval from Arlee Muslim, [place] as well. Pt. Is in agreement.  I have notified Sarah, RN of the plan as well as Judson Roch, CM and [place] SW.  I will make arrangements to admit today.  Please call if questions.  [place] Admissions Coordinator Cell [phone] Office [phone]

## 2015-03-12 NOTE — Progress Notes (Signed)
Occupational Therapy Treatment Patient Details Name: Jamie Martinez MRN: [id number] DOB: 10/24/1956 Today's Date: 03/12/2015    History of present illness Pt is s/p Bil TKR   OT comments  Pt left sitting on BSC. CNA aware  Follow Up Recommendations  CIR;Supervision/Assistance - 24 hour          Precautions / Restrictions Restrictions Weight Bearing Restrictions: No       Mobility Bed Mobility Overal bed mobility: Independent Bed Mobility: Supine to Sit     Supine to sit: Min assist Sit to supine: Min assist   General bed mobility comments: cues for sequence, assist for bil LE management  Transfers Overall transfer level: Needs assistance Equipment used: Rolling walker (2 wheeled)   Sit to Stand: Mod assist;+2 physical assistance;+2 safety/equipment Stand pivot transfers: Mod assist;+2 physical assistance;+2 safety/equipment                ADL                           Toilet Transfer: +2 for physical assistance;+2 for safety/equipment;Moderate assistance;RW   Toileting- Clothing Manipulation and Hygiene: +2 for safety/equipment;+2 for physical assistance;Sit to/from stand;Maximal assistance         General ADL Comments: pt needed to sit on [facility] for a while, CNA aware.                          General Comments  pt very motivated and demonstrates good participation     Frequency Min 2X/week     Progress Toward Goals  OT Goals(current goals can now be found in the care plan section)  Progress towards OT goals: Progressing toward goals     Plan Discharge plan remains appropriate       End of Session Equipment Utilized During Treatment: Rolling walker;Right knee immobilizer;Left knee immobilizer CPM Left Knee CPM Left Knee: Off CPM Right Knee CPM Right Knee: Off   Activity Tolerance Patient tolerated treatment well   Patient Left Other (comment) (on [facility])   Nurse Communication Mobility status        Time:  9562-1308 OT Time Calculation (min): 15 min  Charges: OT General Charges $OT Visit: 1 Procedure OT Treatments $Self Care/Home Management : 8-22 mins  Aleeta Schmaltz D 03/12/2015, 11:01 AM

## 2015-03-13 ENCOUNTER — Inpatient Hospital Stay (HOSPITAL_COMMUNITY): Payer: PRIVATE HEALTH INSURANCE | Admitting: Physical Therapy

## 2015-03-13 ENCOUNTER — Inpatient Hospital Stay (HOSPITAL_COMMUNITY): Payer: PRIVATE HEALTH INSURANCE

## 2015-03-13 DIAGNOSIS — M170 Bilateral primary osteoarthritis of knee: Principal | ICD-10-CM

## 2015-03-13 DIAGNOSIS — D62 Acute posthemorrhagic anemia: Secondary | ICD-10-CM

## 2015-03-13 LAB — PROTIME-INR
INR: 1.92 — ABNORMAL HIGH (ref 0.00–1.49)
Prothrombin Time: 22.1 s — ABNORMAL HIGH (ref 11.6–15.2)

## 2015-03-13 LAB — BASIC METABOLIC PANEL
ANION GAP: 5 (ref 5–15)
BUN: 12 mg/dL (ref 6–23)
CALCIUM: 8.3 mg/dL — AB (ref 8.4–10.5)
CHLORIDE: 93 mmol/L — AB (ref 96–112)
CO2: 33 mmol/L — AB (ref 19–32)
Creatinine, Ser: 0.49 mg/dL — ABNORMAL LOW (ref 0.50–1.10)
GFR calc Af Amer: >90 mL/min (ref >90)
GFR calc non Af Amer: >90 mL/min (ref >90)
GLUCOSE: 158 mg/dL — AB (ref 70–99)
Potassium: 4.3 mmol/L (ref 3.5–5.1)
SODIUM: 131 mmol/L — AB (ref 135–145)

## 2015-03-13 MED ORDER — FLEET ENEMA 7-19 GM/118ML RE ENEM
1.0000 | ENEMA | Freq: Every day | RECTAL | Status: DC | PRN
  Administered 2015-03-14: 1 via RECTAL
  Filled 2015-03-13: qty 1

## 2015-03-13 MED ORDER — METOCLOPRAMIDE HCL 5 MG/ML IJ SOLN
10.0000 mg | Freq: Four times a day (QID) | INTRAMUSCULAR | Status: DC
  Administered 2015-03-13 – 2015-03-15 (×7): 10 mg via INTRAVENOUS
  Filled 2015-03-13 (×11): qty 2

## 2015-03-13 MED ORDER — SORBITOL 70 % SOLN
60.0000 mL | Status: AC
  Administered 2015-03-13: 60 mL via ORAL
  Filled 2015-03-13: qty 60

## 2015-03-13 MED ORDER — METOCLOPRAMIDE HCL 5 MG PO TABS
5.0000 mg | ORAL_TABLET | Freq: Three times a day (TID) | ORAL | Status: DC
  Filled 2015-03-13 (×3): qty 1

## 2015-03-13 MED ORDER — WARFARIN SODIUM 7.5 MG PO TABS
7.5000 mg | ORAL_TABLET | Freq: Once | ORAL | Status: AC
  Administered 2015-03-13: 7.5 mg via ORAL
  Filled 2015-03-13 (×2): qty 1

## 2015-03-13 MED ORDER — POLYETHYLENE GLYCOL 3350 17 G PO PACK
17.0000 g | PACK | Freq: Two times a day (BID) | ORAL | Status: DC
  Administered 2015-03-14 – 2015-03-17 (×7): 17 g via ORAL
  Filled 2015-03-13 (×9): qty 1

## 2015-03-13 NOTE — Progress Notes (Signed)
Pts LBM was 03/07/2015. Offered her a PRN suppository and she refused. She denies feeling constipated. Will continue to monitor. Kennieth Francois, RN

## 2015-03-13 NOTE — Evaluation (Signed)
Occupational Therapy Assessment and Plan  Patient Details  Name: Jamie Martinez MRN: [id number] Date of Birth: 1956-05-04  OT Diagnosis: acute pain, muscle weakness (generalized), pain in joint and swelling of limb Rehab Potential: Rehab Potential (ACUTE ONLY): Excellent ELOS: 7-9 days   Today's Date: 03/13/2015 OT Individual Time: 0800-0906 OT Individual Time Calculation (min): 66 min     Problem List:  Patient Active Problem List   Diagnosis Date Noted  . Status post total bilateral knee replacement 03/12/2015  . Acute blood loss anemia 03/12/2015  . Status post bilateral knee replacements 03/12/2015  . Postoperative anemia due to acute blood loss 03/11/2015  . OA (osteoarthritis) of knee 03/07/2015    Past Medical History:  Past Medical History  Diagnosis Date  . Hypothyroidism   . Arthritis   . Cancer 2010    left breast surgery and radiation ,  dec 2013 colon chemo done for colon   Past Surgical History:  Past Surgical History  Procedure Laterality Date  . Breast surgery Left 2010    lumpectomy done, lymph nodes removed   . Abdominal hysterectomy      partial  . Colon surgery  dec 2013    surgery for colon cancer, part of bladder, part of pelvic wall removed appendix removed 1 ovary removed   . Appendectomy  dec 2013  . Knee arthroscopy Bilateral   . Total knee arthroplasty Bilateral 03/07/2015    Procedure: BILATERAL TOTAL KNEE ARTHROPLASTY;  Surgeon: Gaynelle Arabian, MD;  Location: [facility] ORS;  Service: Orthopedics;  Laterality: Bilateral;  +epidural    Assessment & Plan Clinical Impression: Jamie Martinez is a 59 y.o. right handed female with history of breast and colon cancer as well as remote tobacco abuse. Patient presented with end-stage osteoarthritis bilateral knees 10 years and failed nonsurgical conservative treatments including NSAIDS, corticosteroid injections and activity modification. Independent prior to admission living with her husband. Patient admitted  03/07/2015 undergoing elective bilateral total knee arthroplasty per Dr. Maureen Ralphs. Postoperative pain management with epidural catheter removed 03/09/2015. Weightbearing as tolerated. Placed on Coumadin for DVT prophylaxis 4 weeks total then begin aspirin 81 mg daily. Acute blood loss anemia 7.2 and transfused. Physical therapy evaluation completed 03/08/2015 with recommendations of physical medicine rehabilitation consult. Patient transferred to CIR on 03/12/2015 .    Patient currently requires min-mod assist with basic self-care skills secondary to muscle weakness and muscle joint tightness, decreased cardiorespiratoy endurance and decreased standing balance, decreased postural control and decreased balance strategies.  Prior to hospitalization, patient could complete BADLs and IADLs with independent .  Patient will benefit from skilled intervention to increase independence with basic self-care skills and increase level of independence with iADL prior to discharge home with care partner and modified independent goals.  Anticipate patient will require no supervision  and follow-up OT to be determined.  OT - End of Session Activity Tolerance: Decreased this session Endurance Deficit: Yes Endurance Deficit Description: frequent rest breaks OT Assessment Rehab Potential (ACUTE ONLY): Excellent OT Patient demonstrates impairments in the following area(s): Pain;Balance;Safety;Edema;Endurance;Motor OT Basic ADL's Functional Problem(s): Grooming;Bathing;Dressing;Toileting OT Advanced ADL's Functional Problem(s): Simple Meal Preparation;Laundry OT Transfers Functional Problem(s): Toilet;Tub/Shower OT Plan OT Intensity: Minimum of 1-2 x/day, 45 to 90 minutes OT Frequency: 5 out of 7 days OT Duration/Estimated Length of Stay: 7-9 days OT Treatment/Interventions: Medical illustrator training;Community reintegration;Discharge planning;DME/adaptive equipment instruction;Functional mobility  training;Neuromuscular re-education;Pain management;Psychosocial support;Patient/family education;Self Care/advanced ADL retraining;Therapeutic Activities;Therapeutic Exercise;UE/LE Strength taining/ROM;Splinting/orthotics;UE/LE Coordination activities;Wheelchair propulsion/positioning OT Self Feeding Anticipated Outcome(s): n/a OT  Basic Self-Care Anticipated Outcome(s): Mod I OT Toileting Anticipated Outcome(s): Mod I OT Bathroom Transfers Anticipated Outcome(s): Mod I OT Recommendation Patient destination: Home Follow Up Recommendations: Other (comment) (TBD) Equipment Recommended: To be determined   Skilled Therapeutic Intervention OT eval completed. Discussed role of OT, goals of therapy, safety plan, fall risk, DME, and possible ELOS. OT session focused on bed mobility, standing balance, functional transfers, and activity tolerance. Completed supine>sit with mod A to manage BLEs and mod cues for technique and sequencing. Attempted to complete with HOB flat to simulate home environment, however pt with increased anxiety with HOB flat. Completed bathing sitting EOB with setup assist for UB self-care. Pt requesting to toilet therefore completed stand pivot transfer bed>BSC with min A and mod cues for technique and safety. Pt required min A for dynamics standing balance during self-care tasks. Pt declined LB dressing secondary to feeling "too hot." Encouraged husband to bring shoes and cooler clothing for pt to wear, however pt hesitant stating "I would just rather wear gowns." Discussed role of OT again with pt and she was agreeable to dress on "some days." Pt left supine in bed with all needs in reach. Pt required increased time for all transitional movements secondary to pain and fear of pain.    OT Evaluation Precautions/Restrictions  Precautions Precautions: Knee;Fall Required Braces or Orthoses: Knee Immobilizer - Right;Knee Immobilizer - Left Knee Immobilizer - Right: Discontinue once  straight leg raise with < 10 degree lag Knee Immobilizer - Left: Discontinue once straight leg raise with < 10 degree lag Restrictions Weight Bearing Restrictions: No RLE Weight Bearing: Weight bearing as tolerated LLE Weight Bearing: Weight bearing as tolerated General   Vital Signs   Pain Pain Assessment Pain Assessment: 0-10 Pain Score: 7  Pain Type: Acute pain Pain Location: Knee Pain Orientation: Right;Left Pain Descriptors / Indicators: Aching Pain Frequency: Intermittent Pain Onset: With Activity Patients Stated Pain Goal: 4 Pain Intervention(s): Medication (See eMAR) Home Living/Prior [place] expects to be discharged to:: Private residence Living Arrangements: Spouse/significant other Available Help at Discharge: Available PRN/intermittently Type of Home: House Home Access: Stairs to enter [employer] of Steps: 3 STE in front and 6 STE in back Home Layout: Two level, Able to live on main level with bedroom/bathroom  Lives With: Spouse, Son Prior Function Level of Independence: Independent with basic ADLs, Independent with homemaking with ambulation, Independent with gait Driving: Yes Vocation: Full time employment Vocation Requirements: working in [place]: Hobbies-yes (Comment) Comments: hiking, walking ADL   Vision/Perception  Vision- History Baseline Vision/History: Wears glasses Wears Glasses: At all times Patient Visual Report: No change from baseline Vision- Assessment Vision Assessment?: No apparent visual deficits  Cognition Overall Cognitive Status: Within Functional Limits for tasks assessed Arousal/Alertness: Awake/alert Orientation Level: Oriented X4 Attention: Sustained;Selective Sustained Attention: Appears intact Selective Attention: Appears intact Memory: Appears intact Awareness: Impaired Awareness Impairment: Anticipatory impairment Problem Solving: Impaired Problem Solving  Impairment: Functional complex Safety/Judgment: Appears intact Sensation Sensation Light Touch: Appears Intact Hot/Cold: Appears Intact Proprioception: Appears Intact Coordination Gross Motor Movements are Fluid and Coordinated: No Fine Motor Movements are Fluid and Coordinated: Yes Coordination and Movement Description: limited ROM in BLE Motor  Motor Motor - Skilled Clinical Observations: limited by pain in BLE Mobility  Bed Mobility Bed Mobility: Supine to Sit;Sit to Supine Supine to Sit: 3: Mod assist Supine to Sit Details: Verbal cues for sequencing;Verbal cues for technique;Manual facilitation for weight shifting;Manual facilitation for placement Supine to Sit Details (indicate  cue type and reason): mod A to manage BLE Sit to Supine: 3: Mod assist Sit to Supine - Details: Verbal cues for sequencing;Verbal cues for technique;Manual facilitation for placement;Manual facilitation for weight shifting Sit to Supine - Details (indicate cue type and reason): mod A to manage BLE Transfers Transfers: Sit to Stand;Stand to Sit Sit to Stand: 4: Min assist Sit to Stand Details: Verbal cues for sequencing;Verbal cues for technique;Manual facilitation for weight shifting Stand to Sit: 4: Min assist Stand to Sit Details (indicate cue type and reason): Verbal cues for technique;Verbal cues for sequencing;Manual facilitation for weight shifting  Trunk/Postural Assessment  Cervical Assessment Cervical Assessment: Within Functional Limits Thoracic Assessment Thoracic Assessment: Within Functional Limits Lumbar Assessment Lumbar Assessment: Within Functional Limits  Balance Balance Balance Assessed: Yes Dynamic Sitting Balance Dynamic Sitting - Balance Support: Feet supported;During functional activity Dynamic Sitting - Level of Assistance: 5: Stand by assistance Static Standing Balance Static Standing - Balance Support: Bilateral upper extremity supported;During functional  activity Static Standing - Level of Assistance: 4: Min assist Dynamic Standing Balance Dynamic Standing - Balance Support: During functional activity (alternating UE support) Dynamic Standing - Level of Assistance: 4: Min assist Extremity/Trunk Assessment RUE Assessment RUE Assessment: Within Functional Limits (5/5) LUE Assessment LUE Assessment: Within Functional Limits (5/5)  FIM:  FIM - Eating Eating Activity: 7: Complete independence:no helper FIM - Grooming Grooming Steps: Wash, rinse, dry face;Wash, rinse, dry hands Grooming: 5: Set-up assist to obtain items FIM - Bathing Bathing Steps Patient Completed: Chest;Right Arm;Left Arm;Abdomen;Front perineal area Bathing: 3: Mod-Patient completes 5-7 [id number] parts or 50-74% FIM - Upper Body Dressing/Undressing Upper body dressing/undressing steps patient completed: Thread/unthread right sleeve of pullover shirt/dresss;Thread/unthread left sleeve of pullover shirt/dress;Put head through opening of pull over shirt/dress;Pull shirt over trunk Upper body dressing/undressing: 3: Mod-Patient completed 50-74% of tasks FIM - Lower Body Dressing/Undressing Lower body dressing/undressing: 0: Activity did not occur FIM - Toileting Toileting steps completed by patient: Performs perineal hygiene Toileting: 4: Steadying assist (pt not wearing clothing) FIM - BControl and instrumentation engineerDevices: Bed rails;HOB elevated;Walker Bed/Chair Transfer: 3: Supine > Sit: Mod A (lifting assist/Pt. 50-74%/lift 2 legs;3: Sit > Supine: Mod A (lifting assist/Pt. 50-74%/lift 2 legs);4: Chair or W/C > Bed: Min A (steadying Pt. > 75%);4: Bed > Chair or W/C: Min A (steadying Pt. > 75%) FIM - TRadio producerDevices: BNurse, learning disabilityTransfers: 4-To toilet/BSC: Min A (steadying Pt. > 75%);4-From toilet/BSC: Min A (steadying Pt. > 75%)   Refer to Care Plan for Long Term Goals  Recommendations for other  services: None  Discharge Criteria: Patient will be discharged from OT if patient refuses treatment 3 consecutive times without medical reason, if treatment goals not met, if there is a change in medical status, if patient makes no progress towards goals or if patient is discharged from hospital.  The above assessment, treatment plan, treatment alternatives and goals were discussed and mutually agreed upon: by patient  PDuayne Cal3/22/2016, 9:50 AM

## 2015-03-13 NOTE — Evaluation (Signed)
Physical Therapy Assessment and Plan  Patient Details  Name: Jamie Martinez MRN: [id number] Date of Birth: 09-14-1956  PT Diagnosis: Difficulty walking, Edema and Pain in bilateral knees Rehab Potential: Good ELOS: 7-9 days   Today's Date: 03/13/2015 PT Individual Time: 1000-1100 PT Individual Time Calculation (min): 60 min    Problem List:  Patient Active Problem List   Diagnosis Date Noted  . Status post total bilateral knee replacement 03/12/2015  . Acute blood loss anemia 03/12/2015  . Status post bilateral knee replacements 03/12/2015  . Postoperative anemia due to acute blood loss 03/11/2015  . OA (osteoarthritis) of knee 03/07/2015    Past Medical History:  Past Medical History  Diagnosis Date  . Hypothyroidism   . Arthritis   . Cancer 2010    left breast surgery and radiation ,  dec 2013 colon chemo done for colon   Past Surgical History:  Past Surgical History  Procedure Laterality Date  . Breast surgery Left 2010    lumpectomy done, lymph nodes removed   . Abdominal hysterectomy      partial  . Colon surgery  dec 2013    surgery for colon cancer, part of bladder, part of pelvic wall removed appendix removed 1 ovary removed   . Appendectomy  dec 2013  . Knee arthroscopy Bilateral   . Total knee arthroplasty Bilateral 03/07/2015    Procedure: BILATERAL TOTAL KNEE ARTHROPLASTY;  Surgeon: Gaynelle Arabian, MD;  Location: [facility];  Service: Orthopedics;  Laterality: Bilateral;  +epidural    Assessment & Plan Clinical Impression: Jamie Martinez is a 59 y.o. right handed female with history of breast and colon cancer as well as remote tobacco abuse. Patient presented with end-stage osteoarthritis bilateral knees 10 years and failed nonsurgical conservative treatments including NSAIDS, corticosteroid injections and activity modification. Independent prior to admission living with her husband. Patient admitted 03/07/2015 undergoing elective bilateral total knee  arthroplasty per Dr. Maureen Ralphs. Postoperative pain management with epidural catheter removed 03/09/2015. Weightbearing as tolerated. Placed on Coumadin for DVT prophylaxis 4 weeks total then begin aspirin 81 mg daily. Acute blood loss anemia 7.2 and transfused. Physical therapy evaluation completed 03/08/2015 with recommendations of physical medicine rehabilitation consult. Patient was admitted for comprehensive rehabilitation program. Patient transferred to CIR on 03/12/2015.   Patient currently requires min with mobility secondary to decreased B knee ROM, B knee pain, decreased balance and balance strategies, muscle weakness and muscle joint tightness and decreased cardiorespiratoy endurance.  Prior to hospitalization, patient was independent  with mobility and lived with Spouse, Son in a House home.  Home access is 3 STE in front (no rails) and 6 STE in back with R railStairs to enter.  Patient will benefit from skilled PT intervention to maximize safe functional mobility, minimize fall risk and decrease caregiver burden for planned discharge home with intermittent assist.  Anticipate patient will benefit from follow up [facility] at discharge.  PT - End of Session Activity Tolerance: Tolerates 30+ min activity with multiple rests;Decreased this session Endurance Deficit: Yes Endurance Deficit Description: frequent rest breaks PT Assessment Rehab Potential (ACUTE/IP ONLY): Good Barriers to Discharge: Inaccessible home environment PT Patient demonstrates impairments in the following area(s): Balance;Edema;Endurance;Motor;Nutrition;Pain;Safety PT Transfers Functional Problem(s): Bed Mobility;Bed to Chair;Car;Furniture PT Locomotion Functional Problem(s): Ambulation;Wheelchair Mobility;Stairs PT Plan PT Intensity: Minimum of 1-2 x/day ,45 to 90 minutes PT Frequency: 5 out of 7 days PT Duration Estimated Length of Stay: 7-9 days PT Treatment/Interventions: Ambulation/gait training;Balance/vestibular  training;Community reintegration;Discharge planning;DME/adaptive equipment instruction;Functional electrical  stimulation;Functional mobility training;Neuromuscular re-education;Pain management;Patient/family education;Psychosocial support;Stair training;Therapeutic Activities;Therapeutic Exercise;UE/LE Strength taining/ROM;UE/LE Coordination activities;Wheelchair propulsion/positioning PT Transfers Anticipated Outcome(s): mod I  PT Locomotion Anticipated Outcome(s): mod I ambulatory PT Recommendation Follow Up Recommendations: Home health PT Patient destination: Home Equipment Recommended: Rolling walker with 5" wheels   Skilled Therapeutic Intervention Skilled therapeutic intervention initiated after completion of evaluation. Discussed with patient and family falls risk, safety within room, and focus of therapy during stay. Discussed possible LOS, goals, and f/u therapy. Patient became tearful when therapist would not raise bed to maximum height for transfers. Extensive patient and family education provided regarding inpatient rehab goals to assist patient in maximizing independence and returning to home. Patient verbalized understanding, continue to reinforce education.   PT Evaluation Precautions/Restrictions Precautions Precautions: Knee;Fall Required Braces or Orthoses: Knee Immobilizer - Right;Knee Immobilizer - Left Knee Immobilizer - Right: Discontinue once straight leg raise with < 10 degree lag Knee Immobilizer - Left: Discontinue once straight leg raise with < 10 degree lag Restrictions Weight Bearing Restrictions: No RLE Weight Bearing: Weight bearing as tolerated LLE Weight Bearing: Weight bearing as tolerated General Chart Reviewed: Yes Family/Caregiver Present: Yes (husband and daughter)   Pain Pain Assessment Pain Assessment: 0-10 Pain Score: 8  Pain Type: Acute pain;Surgical pain Pain Location: Knee Pain Orientation: Right;Left Pain Descriptors / Indicators:  Aching Pain Frequency: Intermittent Pain Onset: On-going Patients Stated Pain Goal: 4 Pain Intervention(s): RN made aware;Repositioned Home Living/Prior Functioning Home Living Available Help at Discharge: Available PRN/intermittently (son works day shift and spouse works third shift) Type of Home: House Home Access: Stairs to enter Technical brewer of Steps: 3 STE in front (no rails) and 6 STE in back with R rail Home Layout: Two level;Able to live on main level with bedroom/bathroom  Lives With: Spouse;Son Prior Function Level of Independence: Independent with basic ADLs;Independent with homemaking with ambulation;Independent with gait;Independent with transfers  Able to Take Stairs?: Yes Driving: Yes Vocation: Full time employment Vocation Requirements: working in [place]: Hobbies-yes (Comment) Comments: hiking, walking Vision/Perception   No changes from baseline  Cognition Overall Cognitive Status: Within Functional Limits for tasks assessed Arousal/Alertness: Awake/alert Orientation Level: Oriented X4 Attention: Sustained;Selective Sustained Attention: Appears intact Selective Attention: Appears intact Memory: Appears intact Awareness: Impaired Awareness Impairment: Anticipatory impairment Problem Solving: Impaired Problem Solving Impairment: Functional complex Safety/Judgment: Appears intact Sensation Sensation Light Touch: Appears Intact Hot/Cold: Appears Intact Proprioception: Appears Intact Coordination Gross Motor Movements are Fluid and Coordinated: No Fine Motor Movements are Fluid and Coordinated: Yes Coordination and Movement Description: limited ROM in BLE Motor  Motor Motor: Within Functional Limits Motor - Skilled Clinical Observations: limited by pain in BLE  Mobility Bed Mobility Bed Mobility: Supine to Sit Supine to Sit: 5: Supervision;With rails Supine to Sit Details: Verbal cues for technique Supine to Sit Details (indicate  cue type and reason): mod A to manage BLE Sit to Supine: 3: Mod assist Sit to Supine - Details: Verbal cues for sequencing;Verbal cues for technique;Manual facilitation for placement;Manual facilitation for weight shifting Sit to Supine - Details (indicate cue type and reason): mod A to manage BLE Transfers Transfers: Yes Sit to Stand: 4: Min assist;With upper extremity assist;From chair/3-in-1;From bed Sit to Stand Details: Verbal cues for sequencing;Verbal cues for technique;Manual facilitation for weight shifting Stand to Sit: 4: Min assist;With upper extremity assist;To chair/3-in-1 Stand to Sit Details (indicate cue type and reason): Verbal cues for technique;Verbal cues for sequencing;Manual facilitation for weight shifting Locomotion  Ambulation Ambulation: Yes Ambulation/Gait Assistance: 4:  Min guard Ambulation Distance (Feet): 45 Feet Assistive device: Rolling walker Gait Gait: Yes Gait Pattern: Impaired Gait Pattern: Decreased step length - right;Decreased step length - left;Step-to pattern;Decreased hip/knee flexion - right;Decreased hip/knee flexion - left;Decreased dorsiflexion - right;Decreased dorsiflexion - left (with KIs) Gait velocity: decreased Stairs / Additional Locomotion Stairs: Yes Stairs Assistance: 4: Min guard Stair Management Technique: Two rails;Step to pattern;Forwards Number of Stairs: 8 Height of Stairs: 4 Ramp: Not tested (comment) Curb: Not tested (comment) Architect: Yes Wheelchair Assistance: 5: Investment banker, operational Details: Verbal cues for Marketing executive: Both upper extremities Wheelchair Parts Management: Needs assistance Distance: 150 ft  Trunk/Postural Assessment  Cervical Assessment Cervical Assessment: Within Functional Limits Thoracic Assessment Thoracic Assessment: Within Functional Limits Lumbar Assessment Lumbar Assessment: Within Functional Limits Postural  Control Postural Control: Deficits on evaluation Protective Responses: delayed/impaired due to knee pain/decreased ROM  Balance Balance Balance Assessed: Yes Dynamic Sitting Balance Dynamic Sitting - Balance Support: Feet supported;During functional activity Dynamic Sitting - Level of Assistance: 6: Modified independent (Device/Increase time) Static Standing Balance Static Standing - Balance Support: Bilateral upper extremity supported;During functional activity Static Standing - Level of Assistance: 5: Stand by assistance Dynamic Standing Balance Dynamic Standing - Balance Support: During functional activity;Bilateral upper extremity supported Dynamic Standing - Level of Assistance: 4: Min assist Extremity Assessment  RUE Assessment RUE Assessment: Within Functional Limits (5/5) LUE Assessment LUE Assessment: Within Functional Limits (5/5) RLE Assessment RLE Assessment: Exceptions to [facility] RLE Strength RLE Overall Strength: Deficits;Due to pain RLE Overall Strength Comments: unable to perform SLR, to be further tested at next session due to time constraints LLE Assessment LLE Assessment: Exceptions to [facility] LLE Strength LLE Overall Strength: Deficits;Due to pain LLE Overall Strength Comments: unable to perform SLR, to be further tested at next session due to time constraints  FIM:  FIM - Bed/Chair Transfer Bed/Chair Transfer Assistive Devices: Bed rails;HOB elevated;Walker Bed/Chair Transfer: 3: Supine > Sit: Mod A (lifting assist/Pt. 50-74%/lift 2 legs;3: Sit > Supine: Mod A (lifting assist/Pt. 50-74%/lift 2 legs);4: Chair or W/C > Bed: Min A (steadying Pt. > 75%);4: Bed > Chair or W/C: Min A (steadying Pt. > 75%)   Refer to Care Plan for Long Term Goals  Recommendations for other services: None  Discharge Criteria: Patient will be discharged from PT if patient refuses treatment 3 consecutive times without medical reason, if treatment goals not met, if there is a change in  medical status, if patient makes no progress towards goals or if patient is discharged from hospital.  The above assessment, treatment plan, treatment alternatives and goals were discussed and mutually agreed upon: by patient  Laretta Alstrom 03/13/2015, 10:12 AM

## 2015-03-13 NOTE — Progress Notes (Signed)
   PHYSICAL MEDICINE & REHABILITATION     PROGRESS NOTE    Subjective/Complaints: Had a reasonable night. Pain controlled. Still no bm.  Objective: Vital Signs: Blood pressure 120/68, pulse 100, temperature 98.7 F (37.1 C), temperature source Oral, resp. rate 17, height 5\' 8"  (1.727 m), weight 97.024 kg (213 lb 14.4 oz), SpO2 92 %. No results found.  Recent Labs  03/11/15 0903 03/12/15 1602  WBC 6.3 6.1  HGB 10.2* 10.1*  HCT 31.1* 30.3*  PLT 141* 176    Recent Labs  03/12/15 1602  NA 135  K 4.0  CL 97  GLUCOSE 130*  BUN 10  CREATININE 0.55  CALCIUM 8.4   CBG (last 3)  No results for input(s): GLUCAP in the last 72 hours.  Wt Readings from Last 3 Encounters:  03/12/15 97.024 kg (213 lb 14.4 oz)  03/07/15 92.987 kg (205 lb)    Physical Exam:  Constitutional: She appears well-developed.  HENT: oral mucosa pink and moist Head: Normocephalic.  Eyes: EOM are normal.  Neck: Normal range of motion. Neck supple. No thyromegaly present.  Cardiovascular: Normal rate and regular rhythm. no murmur Respiratory: Effort normal and breath sounds normal. No respiratory distress. no wheezes GI: Soft. Bowel sounds are normal. She exhibits no distension.  Skin:  Bilateral knee incisions are clean. Mild erythema at incisions Neuro: Motor strength is 5/5 bilateral deltoids, biceps, triceps, grip 2 minus bilateral hip flexors and knee extensors 3 + to 4/5 ankle dorsiflexors Sensation paresthesias bilateral feet to light touch. Cognitively appropriate Psych: pleasant and appropriate Assessment/Plan: 1. Functional deficits secondary to bilateral OA of knees s/p bilateral TKA's which require 3+ hours per day of interdisciplinary therapy in a comprehensive inpatient rehab setting. Physiatrist is providing close team supervision and 24 hour management of active medical problems listed below. Physiatrist and rehab team continue to assess barriers to discharge/monitor  patient progress toward functional and medical goals. FIM:                   Comprehension Comprehension Mode: Auditory Comprehension: 7-Follows complex conversation/direction: With no assist  Expression Expression: 7-Expresses complex ideas: With no assist  Social Interaction Social Interaction: 7-Interacts appropriately with others - No medications needed.  Problem Solving Problem Solving: 5-Solves basic 90% of the time/requires cueing < 10% of the time  Memory Memory: 7-Complete Independence: No helper  Medical Problem List and Plan: 1. Functional deficits secondary to end-stage osteoarthritis status post bilateral total knee arthroplasty 03/07/2015 2. DVT Prophylaxis/Anticoagulation: Coumadin for DVT prophylaxis. Monitor any bleeding episodes. Check vascular studies 3. Pain Management: Robaxin and oxycodone as needed. Monitor with increased mobility 4. Acute blood loss anemia. S/p blood transfusion. Follow-up CBC. Continue Niferex daily 5. Neuropsych: This patient is capable of making decisions on her own behalf. 6. Skin/Wound Care: Routine skin checks 7. Fluids/Electrolytes/Nutrition: Strict I&O with follow-up chemistry 8. Hypothyroidism. Synthroid 9. Constipation: needs bm today. Sorbitol this am  LOS (Days) 1 A FACE TO FACE EVALUATION WAS PERFORMED  Sanjna Haskew T 03/13/2015 7:36 AM

## 2015-03-13 NOTE — Progress Notes (Signed)
Patient has had nausea and vomiting today and was unable to eat lunch. Had a large formed incontinent BM this afternoon past multiple laxative this am. Refused enema last pm but discussed rationale to use of enema as well as need for intervention discussed with patient and sister.   Exam: Abdomen is soft, non-distended. Mild abdominal discomfort to palpation. Hyperactive BS noted.    Plan: Will change diet to liquid. Potassium levels stable. Will add Reglan qid as well as SSE today to help with ileus. Check follow up KUB in am.

## 2015-03-13 NOTE — Progress Notes (Signed)
ANTICOAGULATION CONSULT NOTE - Follow Up Consult  Pharmacy Consult for coumadin Indication: VTE prophylaxis  No Known Allergies  Patient Measurements: Height: 5\' 8"  (172.7 cm) Weight: 213 lb 14.4 oz (97.024 kg) IBW/kg (Calculated) : 63.9 Heparin Dosing Weight:   Vital Signs: Temp: 98.7 F (37.1 C) (03/22 0515) Temp Source: Oral (03/22 0515) BP: 120/68 mmHg (03/22 0515) Pulse Rate: 100 (03/22 0515)  Labs:  Recent Labs  03/11/15 0452 03/11/15 0903 03/12/15 0442 03/12/15 1602 03/13/15 0730  HGB  --  10.2*  --  10.1*  --   HCT  --  31.1*  --  30.3*  --   PLT  --  141*  --  176  --   LABPROT 17.8*  --  19.3*  --  22.1*  INR 1.46  --  1.61*  --  1.92*  CREATININE  --   --   --  0.55  --     Estimated Creatinine Clearance: 94.4 mL/min (by C-G formula based on Cr of 0.55).   Medications:  Scheduled:  . docusate sodium  100 mg Oral BID  . enoxaparin (LOVENOX) injection  30 mg Subcutaneous Q12H  . iron polysaccharides  150 mg Oral Daily  . letrozole  2.5 mg Oral Daily  . levothyroxine  300 mcg Oral QPM  . polyethylene glycol  17 g Oral BID  . senna-docusate  1 tablet Oral QHS  . Warfarin - Pharmacist Dosing Inpatient   Does not apply q1800   Infusions:    Assessment: 59 yo female s/p ortho surgery is currently on subtherapeutic coumadin for VTE prophylaxis.  INR today is 1.92 to 1.61.  She is also on lovenox 30mg  sq q12h.  Goal of Therapy:  INR 2-3 Monitor platelets by anticoagulation protocol: Yes   Plan:  Warfarin 7.5 mg PO today at 1800 Daily PT/INR. As per ortho orders, Lovenox 30mg  SQ q12h, discontinue when INR 2.0 or higher.  Waylan Busta, Tsz-Yin 03/13/2015,8:17 AM

## 2015-03-13 NOTE — Progress Notes (Signed)
Occupational Therapy Session Note  Patient Details  Name: Jamie Martinez MRN: [id number] Date of Birth: October 30, 1956  Today's Date: 03/13/2015 OT Individual Time: 1300-1346 OT Individual Time Calculation (min): 46 min  Missed 10 min due to nausea/vomitting   Short Term Goals: Week 1:  OT Short Term Goal 1 (Week 1): STGs=LTGs due to short ELOS  Skilled Therapeutic Interventions/Progress Updates:  Pt seen for 1:1 OT session with focus on pain management, functional mobility, and use of AE. Pt received supine in bed reporting feeling nauseous however agreeable to therapy after RN provided medicine. Therapist donned KIs then pt completed supine>sit with mod A to manage BLE. Educated on use of reacher and sock-aid with pt returning demonstrating with min A. Educated on stores to purchase AE as well. Discussed DME with pt agreeable to needing TTB vs shower chair. Informed that insurance would not cover TTB, therefore pt to discuss with husband. Pt requesting to toilet, ambulating approx 12' bed>bathroom with min A using RW. While sitting on toilet pt began vomiting. Pt completed hygiene with min A for thoroughness then returned to bed. RN notified of vomiting. Pt left supine in bed with all needs in reach.   Therapy Documentation Precautions:  Precautions Precautions: Knee, Fall Required Braces or Orthoses: Knee Immobilizer - Right, Knee Immobilizer - Left Knee Immobilizer - Right: Discontinue once straight leg raise with < 10 degree lag Knee Immobilizer - Left: Discontinue once straight leg raise with < 10 degree lag Restrictions Weight Bearing Restrictions: No RLE Weight Bearing: Weight bearing as tolerated LLE Weight Bearing: Weight bearing as tolerated General:   Vital Signs:   Pain: Pain Assessment Pain Assessment: 0-10 Pain Score: 8  (pt vomited shortly after) Pain Type: Acute pain;Surgical pain Pain Location: Knee Pain Orientation: Right;Left Pain Descriptors / Indicators:  Aching Pain Onset: On-going Patients Stated Pain Goal: 4 Pain Intervention(s): Medication (See eMAR)  See FIM for current functional status  Therapy/Group: Individual Therapy  Rino Hosea, Quillian Quince 03/13/2015, 1:50 PM

## 2015-03-13 NOTE — Progress Notes (Signed)
Patient information reviewed and entered into eRehab System by Becky Dae Antonucci, covering PPS coordinator. Information including medical coding and functional independence measure will be reviewed and updated through discharge.   

## 2015-03-13 NOTE — Patient Care Conference (Addendum)
Inpatient RehabilitationTeam Conference and Plan of Care Update Date: 03/14/2015   Time: 2:55 PM    Patient Name: Jamie Martinez      Medical Record Number: [id number]  Date of Birth: [birth year] Sex: Female         Room/Bed: 4M04C/4M04C-01 Payor Info: Payor: MEDCOST / Plan: MEDCOST / Product Type: *No Product type* /    Admitting Diagnosis: bilat TKAS  Admit Date/Time:  03/12/2015  2:37 PM Admission Comments: No comment available   Primary Diagnosis:  Status post total bilateral knee replacement Principal Problem: Status post total bilateral knee replacement  Patient Active Problem List   Diagnosis Date Noted  . Status post total bilateral knee replacement 03/12/2015  . Acute blood loss anemia 03/12/2015  . Status post bilateral knee replacements 03/12/2015  . Postoperative anemia due to acute blood loss 03/11/2015  . OA (osteoarthritis) of knee 03/07/2015    Expected Discharge Date: Expected Discharge Date: 03/17/15  Team Members Present: Physician leading conference: Dr. Alger Simons Social Worker Present: Lennart Pall, LCSW Nurse Present: Elliot Cousin, RN PT Present: Carney Living, PT OT Present: Roanna Epley, Griffin Basil, OT SLP Present: Weston Anna, SLP Other (Discipline and Name): Danne Baxter, RN [facility])     Current Status/Progress Goal Weekly Team Focus  Medical   bilateral knee replacements  improve pain control and tolerance  pain control, bowel movement   Bowel/Bladder   Continent of bowel and bladder; LBM 3/16- sorbitol given- awaiting results  Mod I   Assess and treat for constipation prn   Swallow/Nutrition/ Hydration             ADL's   min A overall  Mod I   pain management, transfers, standing balance, activity tolerance, BLE AROM, strengthening, safety, education   Mobility   supervision-min guard  mod I overall, supervision for stairs and car transfer  functional mobility,  BLE ROM and strengthening, activity tolerance, pain  management, safety awareness, pt/family education   Communication             Safety/Cognition/ Behavioral Observations            Pain   C/o pain in B knees- oxycodone 15mg  q3h prn; robaxin 500mg  po q6h prn  < 5  Assess and treat for pain q shift and prn   Skin   Incision to Bilat knees with steri strips- CDI  No skin breakdown or infection with mod I assist  Assess skin q shift and prn    Rehab Goals Patient on target to meet rehab goals: Yes *See Care Plan and progress notes for long and short-term goals.  Barriers to Discharge: improve activity tolerance    Possible Resolutions to Barriers:  realistic expectations with therapy. pain control    Discharge Planning/Teaching Needs:  home with family who can provide 24/7 assist if needed      Team Discussion:  Currently min assist with mod i goals.  Bowel issues - resolved today?  Pt upset with therapy techniques and expectation of CIR unit.  Hope this will improve if she simply feels better with b/b function.  Revisions to Treatment Plan:  None   Continued Need for Acute Rehabilitation Level of Care: The patient requires daily medical management by a physician with specialized training in physical medicine and rehabilitation for the following conditions: Daily direction of a multidisciplinary physical rehabilitation program to ensure safe treatment while eliciting the highest outcome that is of practical value to the patient.: Yes Daily medical management  of patient stability for increased activity during participation in an intensive rehabilitation regime.: Yes Daily analysis of laboratory values and/or radiology reports with any subsequent need for medication adjustment of medical intervention for : Post surgical problems;Other  Laverle Pillard 03/14/2015, 12:44 PM

## 2015-03-13 NOTE — Progress Notes (Signed)
Social Work  Social Work Assessment and Plan  Patient Details  Name: Jamie Martinez MRN: [id number] Date of Birth: 13-Nov-1956  Today's Date: 03/13/2015  Problem List:  Patient Active Problem List   Diagnosis Date Noted  . Status post total bilateral knee replacement 03/12/2015  . Acute blood loss anemia 03/12/2015  . Status post bilateral knee replacements 03/12/2015  . Postoperative anemia due to acute blood loss 03/11/2015  . OA (osteoarthritis) of knee 03/07/2015   Past Medical History:  Past Medical History  Diagnosis Date  . Hypothyroidism   . Arthritis   . Cancer 2010    left breast surgery and radiation ,  dec 2013 colon chemo done for colon   Past Surgical History:  Past Surgical History  Procedure Laterality Date  . Breast surgery Left 2010    lumpectomy done, lymph nodes removed   . Abdominal hysterectomy      partial  . Colon surgery  dec 2013    surgery for colon cancer, part of bladder, part of pelvic wall removed appendix removed 1 ovary removed   . Appendectomy  dec 2013  . Knee arthroscopy Bilateral   . Total knee arthroplasty Bilateral 03/07/2015    Procedure: BILATERAL TOTAL KNEE ARTHROPLASTY;  Surgeon: Jamie Arabian, MD;  Location: [facility];  Service: Orthopedics;  Laterality: Bilateral;  +epidural   Social History:  reports that she has never smoked. She has never used smokeless tobacco. She reports that she does not drink alcohol or use illicit drugs.  Family / Support Systems Marital Status: Married How Long?: 20 yrs Patient Roles: Spouse, Parent Spouse/Significant Other: Jamie Martinez @ (H) [phone] or (C) [phone] Children: pt and spouse have 4 adult children total (2nd marriages for both).  Pt's daughter, Jamie Martinez, lives approx 15 mins from pt.  Pt's (85 yr old) son, Jamie Martinez, lives with pt and husband but does work full-time. Anticipated Caregiver: husband, son and self Ability/Limitations of Caregiver: Husband works 3rd shift, son works 1st  shift. Caregiver Availability: Intermittent Family Dynamics: pt describes very supportive family and denies any concerns about support available at home.  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  Discussed with pt and daughter the unlikelyhood that SSD would be approved for current surgeries, however, they can start the process if they wish. Legal Hisotry/Current Legal Issues: None Guardian/Conservator: None - per MD, pt capable of making decisions on her own behalf   Abuse/Neglect Physical Abuse: Denies Verbal Abuse: Denies Sexual Abuse: Denies Exploitation of patient/patient's resources: Denies Self-Neglect: Denies  Emotional Status Pt's affect, behavior adn adjustment status: Pt with rather flat affect and expresses frustrations with bowel issues and poor sleep.  Husband and daughter in the room and very attentive.  Most discussion centered on pt's desire to apply for SSD. Provided information on application process, however, pt and daughter familiar as they had applied after pt's breast and colon cancer.  Pt denies any significant emotional distress - will monitor. Recent Psychosocial Issues: declining mobility due to OA in knees Pyschiatric History: None Substance Abuse History: None  Patient / Family Perceptions, Expectations & Goals Pt/Family understanding of illness & functional limitations: Pt and family with good understanding of surgery performed and current functional limitations/ need for CIR. Premorbid pt/family roles/activities: Pt was independent PTA and not  using in AD but some limited mobility Anticipated changes in roles/activities/participation: little change anticipated as tx have mod i goals overall.   Pt/family  expectations/goals: "I just want my bowels to get right."  [employer]: None Premorbid Home Care/DME Agencies: None Transportation available at discharge: yes  Discharge Planning Living Arrangements: Spouse/significant other, Children Support Systems: Spouse/significant other, Children Type of Residence: Private residence Insurance Resources: Multimedia programmer (specify) Environmental education officer) Financial Resources: Employment Museum/gallery curator Screen Referred: No Living Expenses: Higher education careers adviser Management: Patient, Spouse Does the patient have any problems obtaining your medications?: No Home Management: home with husband and son to provide assist as needed Patient/Family Preliminary Plans: Home with family to assist as needed. Social Work Anticipated Follow Up Needs: HH/OP Expected length of stay: 7 days  Clinical Impression Pleasant woman here following bil TKR but with rather flat affect.  Preoccupied with concerns about bowels and with desire to pursue SSD benefits.  Good family support and goals set at mod i.  Anticipate short LOS.  Will follow for support and d/c planning needs.  Jamie Martinez 03/13/2015, 2:02 PM

## 2015-03-13 NOTE — Progress Notes (Signed)
Orthopedic Tech Progress Note Patient Details:  Jamie Martinez 1956-10-11 [id number] Delivered two Velcro knee immobilizers to pt.'s nurse. Patient ID: Jamie Martinez, female   DOB: 1956-07-21, 59 y.o.   MRN: [id number]   Darrol Poke 03/13/2015, 2:26 PM

## 2015-03-14 ENCOUNTER — Inpatient Hospital Stay (HOSPITAL_COMMUNITY): Payer: PRIVATE HEALTH INSURANCE

## 2015-03-14 ENCOUNTER — Inpatient Hospital Stay (HOSPITAL_COMMUNITY): Payer: PRIVATE HEALTH INSURANCE | Admitting: Physical Therapy

## 2015-03-14 DIAGNOSIS — Z86718 Personal history of other venous thrombosis and embolism: Secondary | ICD-10-CM

## 2015-03-14 DIAGNOSIS — M7989 Other specified soft tissue disorders: Secondary | ICD-10-CM

## 2015-03-14 DIAGNOSIS — K913 Postprocedural intestinal obstruction: Secondary | ICD-10-CM

## 2015-03-14 LAB — PROTIME-INR
INR: 2.58 — ABNORMAL HIGH (ref 0.00–1.49)
Prothrombin Time: 27.9 seconds — ABNORMAL HIGH (ref 11.6–15.2)

## 2015-03-14 MED ORDER — WARFARIN SODIUM 5 MG PO TABS
5.0000 mg | ORAL_TABLET | Freq: Once | ORAL | Status: AC
  Administered 2015-03-14: 5 mg via ORAL
  Filled 2015-03-14: qty 1

## 2015-03-14 NOTE — Plan of Care (Signed)
Problem: RH PAIN MANAGEMENT Goal: RH STG PAIN MANAGED AT OR BELOW PT'S PAIN GOAL < 5  Outcome: Not Progressing Pain rated at a 7 or higher throughout shift

## 2015-03-14 NOTE — Progress Notes (Signed)
ANTICOAGULATION CONSULT NOTE - Follow Up Consult  Pharmacy Consult for coumadin Indication: VTE prophylaxis  No Known Allergies  Patient Measurements: Height: 5\' 8"  (172.7 cm) Weight: 213 lb 14.4 oz (97.024 kg) IBW/kg (Calculated) : 63.9 Heparin Dosing Weight:   Vital Signs: Temp: 98.3 F (36.8 C) (03/23 0415) Temp Source: Oral (03/23 0415) BP: 122/67 mmHg (03/23 0415) Pulse Rate: 97 (03/23 0415)  Labs:  Recent Labs  03/11/15 0903 03/12/15 0442 03/12/15 1602 03/13/15 0730 03/13/15 1854  HGB 10.2*  --  10.1*  --   --   HCT 31.1*  --  30.3*  --   --   PLT 141*  --  176  --   --   LABPROT  --  19.3*  --  22.1*  --   INR  --  1.61*  --  1.92*  --   CREATININE  --   --  0.55  --  0.49*    Estimated Creatinine Clearance: 94.4 mL/min (by C-G formula based on Cr of 0.49).   Medications:  Scheduled:  . docusate sodium  100 mg Oral BID  . enoxaparin (LOVENOX) injection  30 mg Subcutaneous Q12H  . letrozole  2.5 mg Oral Daily  . levothyroxine  300 mcg Oral QPM  . metoCLOPramide (REGLAN) injection  10 mg Intravenous 4 times per day  . polyethylene glycol  17 g Oral BID  . Warfarin - Pharmacist Dosing Inpatient   Does not apply q1800   Infusions:    Assessment: 59 yo female is currently on therapeutic coumadin for VTE prophylaxis s/p ortho surgery.  INR today is up to 2.58.  Goal of Therapy:  INR 2-3 Monitor platelets by anticoagulation protocol: Yes   Plan:  - coumadin 5 mg po x1 - INR in am - d/c lovenox  Trammell Bowden, Tsz-Yin 03/14/2015,8:13 AM

## 2015-03-14 NOTE — Progress Notes (Signed)
VASCULAR LAB PRELIMINARY  PRELIMINARY  PRELIMINARY  PRELIMINARY  BLEV completed.    Preliminary report:  Negative DVT right lower extremity.                                 Positive partial acute thrombosis of one of two left peroneal calf veins.                                 Negative Baker's Cyst bilaterally.  August Albino, RVT 03/14/2015, 11:18 AM

## 2015-03-14 NOTE — Progress Notes (Signed)
Occupational Therapy Session Note  Patient Details  Name: Jamie Martinez MRN: [id number] Date of Birth: 1956/09/01  Today's Date: 03/14/2015 OT Individual Time: 1113-1200 and 1405-1420 OT Individual Time Calculation (min): 47 min and 15 min     Short Term Goals: Week 1:  OT Short Term Goal 1 (Week 1): STGs=LTGs due to short ELOS  Skilled Therapeutic Interventions/Progress Updates:    Session 1: Therapy session focused on use of AE, BUE strengthening and activity tolerance. Pt received supine in bed following ultrasound to BLEs. RVT reporting "suspicious lump" in L calf therefore completed therapy from bed until orders for OOB noted. Pt completed UB and LB bathing with use of LH sponge. Pt declined wearing clothing therefore donned new gown and changed socks with pt utilizing reacher and sock aid with min cues and increased time. Completed BUE strengthening exercises using Level 2 theraband 2 sets x12 reps with focus on shoulder, bicep and tricep strengthening. At end of session pt left supine in bed with HOB elevated and all needs in reach. Pt agreeable to make up missed minutes from ultrasound with therapist this afternoon.  Session 2: Pt seen for 1:1 OT session with focus on TKR exercises to BLEs and activity tolerance. Pt received supine in bed declining getting OOB this PM due to fatigue and pain, however agreeable to bed level exercises. Completed 2 sets of each exercise: Ankle pumps x15 reps, straight leg raises x10 reps (pt required AAROM for LLE); quad sets 6-8 reps with 5 second hold, and AAROM B knee flexion. Pt required 3 rest breaks during therapeutic exercises. Pt left supine in bed with ice packs applied to B knees.   Therapy Documentation Precautions:  Precautions Precautions: Knee, Fall Required Braces or Orthoses: Knee Immobilizer - Right, Knee Immobilizer - Left Knee Immobilizer - Right: Discontinue once straight leg raise with < 10 degree lag Knee Immobilizer - Left:  Discontinue once straight leg raise with < 10 degree lag Restrictions Weight Bearing Restrictions: Yes RLE Weight Bearing: Weight bearing as tolerated LLE Weight Bearing: Weight bearing as tolerated General:   Vital Signs:   Pain: Reported 8/10 pain in BLEs  See FIM for current functional status  Therapy/Group: Individual Therapy  Duayne Cal 03/14/2015, 12:19 PM

## 2015-03-14 NOTE — Progress Notes (Signed)
Administered fleets enema per Algis Liming, PA to decompress bowels.  Patient had a medium soft, formed brownish green stool, and per patient report, she passes a lot of gas.  Patient tolerated well.  Assisted patient back to bed.  Will continue to monitor.  Brita Romp, RN

## 2015-03-14 NOTE — Progress Notes (Signed)
Physical Therapy Session Note  Patient Details  Name: PATRA GHERARDI MRN: [id number] Date of Birth: Feb 02, 1956  Today's Date: 03/14/2015 PT Individual Time: 0800-0900 PT Individual Time Calculation (min): 60 min   Short Term Goals: Week 1:  PT Short Term Goal 1 (Week 1): = LTGs of overall mod I  Skilled Therapeutic Interventions/Progress Updates:   Patient received semi reclined in bed, reporting feeling better since yesterday. Session focused on BLE ROM and strengthening, transfers, gait, and activity tolerance. Donned KIs in bed with total A and patient transferred supine > long sit > sitting edge of bed with HOB flat and supervision. With bed slightly raised, patient performed stand pivot transfer using RW to wheelchair with min guard. Patient propelled wheelchair using BUE for strengthening x 150 ft with supervision and instructed in leg rest management with supervision/assist for lifting BLE off leg rests. Gait training using RW x 90 ft with supervision, verbal cues for upright posture and forward gaze. Patient instructed in keeping RW moving instead of stopping RW with step-to pattern but unable to perform due to dependence on BUE for ambulation. 10 MWT using RW = 0.13 m/s. BLE therex in supine for strengthening/ROM: ankle pumps x 20, glute sets x 15 with 5 sec hold, quad sets to fatigue with 3-5 sec hold. Knee PROM in long sitting on mat: R flexion = 55 deg, R extension = lacking 12 deg, L flexion = 58 deg, L extension = lacking 14 deg. Patient able to perform adequate SLR with RLE, therefore discontinued R knee immobilizer, continue to require L knee immobilizer due to inability to perform SLR. Patient returned to wheelchair and propelled wheelchair back to room using BUE at supervision. Patient left sitting in wheelchair with all needs within reach.    Therapy Documentation Precautions:  Precautions Precautions: Knee, Fall Required Braces or Orthoses: Knee Immobilizer - Right, Knee  Immobilizer - Left Knee Immobilizer - Right: Discontinue once straight leg raise with < 10 degree lag Knee Immobilizer - Left: Discontinue once straight leg raise with < 10 degree lag Restrictions Weight Bearing Restrictions: Yes RLE Weight Bearing: Weight bearing as tolerated LLE Weight Bearing: Weight bearing as tolerated Pain: Pain Assessment Pain Assessment: 0-10 Pain Score: 6  Pain Type: Surgical pain Pain Location: Knee Pain Orientation: Right;Left Pain Frequency: Constant Pain Onset: On-going Pain Intervention(s): Medication (See eMAR)  See FIM for current functional status  Therapy/Group: Individual Therapy  Laretta Alstrom 03/14/2015, 9:09 AM

## 2015-03-14 NOTE — Progress Notes (Signed)
[facility] PHYSICAL MEDICINE & REHABILITATION     PROGRESS NOTE    Subjective/Complaints: Still feels a bit nauseas, not much appetite. Denies pain. Trying to eat some jello this morning  Objective: Vital Signs: Blood pressure 122/67, pulse 97, temperature 98.3 F (36.8 C), temperature source Oral, resp. rate 18, height 5\' 8"  (1.727 m), weight 97.024 kg (213 lb 14.4 oz), SpO2 93 %. Dg Abd 1 View  03/13/2015   CLINICAL DATA:  Vomiting and abdominal distention today. History of colon cancer previous colon surgery.  EXAM: ABDOMEN - 1 VIEW  COMPARISON:  CT 02/23/2015  FINDINGS: There is moderate air throughout the colon. Surgical suture line is seen over the left lower quadrant/left pelvis. There are several air-filled loops of small bowel in the right mid abdomen at the upper limits of normal in diameter. No free peritoneal air. Remainder the exam is within normal.  IMPRESSION: Several air-filled small bowel loops at the upper limits of normal in diameter with air throughout the colon. Findings may be due to ileus versus early/partial obstructive process.   Electronically Signed   By: Marin Olp M.D.   On: 03/13/2015 16:30    Recent Labs  03/11/15 0903 03/12/15 1602  WBC 6.3 6.1  HGB 10.2* 10.1*  HCT 31.1* 30.3*  PLT 141* 176    Recent Labs  03/12/15 1602 03/13/15 1854  NA 135 131*  K 4.0 4.3  CL 97 93*  GLUCOSE 130* 158*  BUN 10 12  CREATININE 0.55 0.49*  CALCIUM 8.4 8.3*   CBG (last 3)  No results for input(s): GLUCAP in the last 72 hours.  Wt Readings from Last 3 Encounters:  03/12/15 97.024 kg (213 lb 14.4 oz)  03/07/15 92.987 kg (205 lb)    Physical Exam:  Constitutional: She appears well-developed.  HENT: oral mucosa pink and moist Head: Normocephalic.  Eyes: EOM are normal.  Neck: Normal range of motion. Neck supple. No thyromegaly present.  Cardiovascular: Normal rate and regular rhythm. no murmur Respiratory: Effort normal and breath sounds  normal. No respiratory distress. no wheezes GI: Soft. Bowel sounds are decreased. She exhibits minimal distension or pain.  Skin:  Bilateral knee incisions are clean. Mild erythema at incisions Neuro: Motor strength is 5/5 bilateral deltoids, biceps, triceps, grip 2 minus bilateral hip flexors and knee extensors 3 + to 4/5 ankle dorsiflexors Sensation paresthesias bilateral feet to light touch. Cognitively appropriate Psych: pleasant and appropriate Assessment/Plan: 1. Functional deficits secondary to bilateral OA of knees s/p bilateral TKA's which require 3+ hours per day of interdisciplinary therapy in a comprehensive inpatient rehab setting. Physiatrist is providing close team supervision and 24 hour management of active medical problems listed below. Physiatrist and rehab team continue to assess barriers to discharge/monitor patient progress toward functional and medical goals.  Discussed with patient regarding expectations of rehab.   FIM: FIM - Bathing Bathing Steps Patient Completed: Chest, Right Arm, Left Arm, Abdomen, Front perineal area Bathing: 3: Mod-Patient completes 5-7 40f 10 parts or 50-74%  FIM - Upper Body Dressing/Undressing Upper body dressing/undressing steps patient completed: Thread/unthread right sleeve of pullover shirt/dresss, Thread/unthread left sleeve of pullover shirt/dress, Put head through opening of pull over shirt/dress, Pull shirt over trunk Upper body dressing/undressing: 3: Mod-Patient completed 50-74% of tasks FIM - Lower Body Dressing/Undressing Lower body dressing/undressing: 0: Activity did not occur  FIM - Toileting Toileting steps completed by patient: Adjust clothing prior to toileting, Adjust clothing after toileting Toileting: 3: Mod-Patient completed 2 of 3 steps  FIM - Radio producer Devices: Walker, Grab bars, Elevated toilet seat Toilet Transfers: 4-To toilet/BSC: Min A (steadying Pt. > 75%), 4-From  toilet/BSC: Min A (steadying Pt. > 75%)  FIM - Bed/Chair Transfer Bed/Chair Transfer Assistive Devices: Bed rails, Walker Bed/Chair Transfer: 3: Supine > Sit: Mod A (lifting assist/Pt. 50-74%/lift 2 legs, 3: Sit > Supine: Mod A (lifting assist/Pt. 50-74%/lift 2 legs), 4: Bed > Chair or W/C: Min A (steadying Pt. > 75%)  FIM - Locomotion: Wheelchair Distance: 150 ft Locomotion: Wheelchair: 5: Travels 150 ft or more: maneuvers on rugs and over door sills with supervision, cueing or coaxing FIM - Locomotion: Ambulation Locomotion: Ambulation Assistive Devices: Administrator Ambulation/Gait Assistance: 4: Min guard Locomotion: Ambulation: 1: Travels less than 50 ft with minimal assistance (Pt.>75%)  Comprehension Comprehension Mode: Auditory Comprehension: 7-Follows complex conversation/direction: With no assist  Expression Expression Mode: Verbal Expression: 7-Expresses complex ideas: With no assist  Social Interaction Social Interaction: 5-Interacts appropriately 90% of the time - Needs monitoring or encouragement for participation or interaction.  Problem Solving Problem Solving: 5-Solves basic 90% of the time/requires cueing < 10% of the time  Memory Memory: 7-Complete Independence: No helper  Medical Problem List and Plan: 1. Functional deficits secondary to end-stage osteoarthritis status post bilateral total knee arthroplasty 03/07/2015 2. DVT Prophylaxis/Anticoagulation: Coumadin for DVT prophylaxis. Monitor any bleeding episodes. Check vascular studies 3. Pain Management: Robaxin and oxycodone as needed. Monitor with increased mobility 4. Acute blood loss anemia. S/p blood transfusion. Follow-up CBC. Hold niferex for now 5. Neuropsych: This patient is capable of making decisions on her own behalf. 6. Skin/Wound Care: Routine skin checks 7. Fluids/Electrolytes/Nutrition: clears 8. Hypothyroidism. Synthroid 9. Constipation: ileus  -clear diet  -iv reglan, hold  iron  -kub pending for this morning.  -miralax, colace, SSE pending xray  LOS (Days) 2 A FACE TO FACE EVALUATION WAS PERFORMED  Jamie Martinez 03/14/2015 7:48 AM

## 2015-03-14 NOTE — Progress Notes (Signed)
Occupational Therapy Session Note  Patient Details  Name: Jamie Martinez MRN: [id number] Date of Birth: [birth year]  Today's Date: 03/14/2015 OT Individual Time: 1300-1400  (60 min)  OT Individual Time Calculation (min):60 min    Short Term Goals: Week 1:  OT Short Term Goal 1 (Week 1): STGs=LTGs due to short ELOS :      Skilled Therapeutic Interventions/Progress Updates:    Skilled OT intervention with treatment focus on the following:  functional transfers, use of AE, activity tolerance, strength, standing balance, education. Pt. Engaged in functional mobility using RW.  Pt. Went from supine to sit with SBA.  She went from sit to stand with SBA and increased time.   Pt taken to ADL apt..  Educated pt on sit to stand from several surfaces (bed, chair, stool.  Pt. Needed minimal assist from low bed but others were SBA.  Husband educated on where to assist pt if she needs help.  Pt. Simulated simple meal prep in kitchen with retrieving items, passing from one counter to another, positioning walker to get items out.  Pt. Reported she had no idea it would take this long to recover from the surgery.  She thought she would be walking without a walker by now.   Pt. Taken to room.  Ambulated about 6 feet to feet and SBA with sit to supine.  Left with all needs in place.      Therapy Documentation Precautions:  Precautions Precautions: Knee, Fall Required Braces or Orthoses: Knee Immobilizer - Right, Knee Immobilizer - Left Knee Immobilizer - Right: Discontinue once straight leg raise with < 10 degree lag Knee Immobilizer - Left: Discontinue once straight leg raise with < 10 degree lag Restrictions Weight Bearing Restrictions: Yes RLE Weight Bearing: Weight bearing as tolerated LLE Weight Bearing: Weight bearing as tolerated       Pain: Pain Assessment Pain Assessment: 0-10 Pain Score: 7 Pain Type: Acute pain Pain Location: Leg Pain Orientation: Right;Left Pain Descriptors /  Indicators: Aching Pain Frequency: Intermittent Pain Onset: With Activity Patients Stated Pain Goal: 4 Pain Intervention(s): Medication (See eMAR)    See FIM for current functional status  Therapy/Group: Individual Therapy  Lisa Roca 03/14/2015, 12:48 PM

## 2015-03-15 ENCOUNTER — Inpatient Hospital Stay (HOSPITAL_COMMUNITY): Payer: PRIVATE HEALTH INSURANCE | Admitting: Physical Therapy

## 2015-03-15 ENCOUNTER — Inpatient Hospital Stay (HOSPITAL_COMMUNITY): Payer: PRIVATE HEALTH INSURANCE

## 2015-03-15 LAB — PROTIME-INR
INR: 2.84 — ABNORMAL HIGH (ref 0.00–1.49)
Prothrombin Time: 30.1 seconds — ABNORMAL HIGH (ref 11.6–15.2)

## 2015-03-15 MED ORDER — WARFARIN SODIUM 5 MG PO TABS
5.0000 mg | ORAL_TABLET | Freq: Once | ORAL | Status: AC
  Administered 2015-03-15: 5 mg via ORAL
  Filled 2015-03-15: qty 1

## 2015-03-15 MED ORDER — METOCLOPRAMIDE HCL 10 MG PO TABS
10.0000 mg | ORAL_TABLET | Freq: Three times a day (TID) | ORAL | Status: DC
Start: 2015-03-15 — End: 2015-03-17
  Administered 2015-03-15 – 2015-03-17 (×9): 10 mg via ORAL
  Filled 2015-03-15 (×14): qty 1

## 2015-03-15 MED ORDER — TRAMADOL HCL 50 MG PO TABS
25.0000 mg | ORAL_TABLET | Freq: Four times a day (QID) | ORAL | Status: DC | PRN
  Administered 2015-03-15: 50 mg via ORAL
  Administered 2015-03-15: 25 mg via ORAL
  Administered 2015-03-15: 50 mg via ORAL
  Administered 2015-03-16 – 2015-03-17 (×3): 25 mg via ORAL
  Filled 2015-03-15 (×8): qty 1

## 2015-03-15 NOTE — Progress Notes (Signed)
Social Work Patient ID: Jamie Martinez, female   DOB: 1956/08/16, 59 y.o.   MRN: [id number]   Met with pt yesterday afternoon to review team conference.  Pt aware and agreeable with targeted d/c date of 3/26 with mod i goals.  Reports feeling better yesterday than the day prior.  No concerns at this point.  Bradleigh Sonnen, LCSW

## 2015-03-15 NOTE — Care Management Note (Signed)
[facility] Individual Statement of Services  Patient Name:  Jamie Martinez  Date:  03/13/2015  Welcome to the [facility].  Our goal is to provide you with an individualized program based on your diagnosis and situation, designed to meet your specific needs.  With this comprehensive rehabilitation program, you will be expected to participate in at least 3 hours of rehabilitation therapies Monday-Friday, with modified therapy programming on the weekends.  Your rehabilitation program will include the following services:  Physical Therapy (PT), Occupational Therapy (OT), 24 hour per day rehabilitation nursing, Therapeutic Recreaction (TR), Case Management (Social Worker), Rehabilitation Medicine, Nutrition Services and Pharmacy Services  Weekly team conferences will be held on Tuesdays to discuss your progress.  Your Social Worker will talk with you frequently to get your input and to update you on team discussions.  Team conferences with you and your family in attendance may also be held.  Expected length of stay: 7 days  Overall anticipated outcome: modified independent  Depending on your progress and recovery, your program may change. Your Social Worker will coordinate services and will keep you informed of any changes. Your Social Worker's name and contact numbers are listed  below.  The following services may also be recommended but are not provided by the [facility] will be made to provide these services after discharge if needed.  Arrangements include referral to agencies that provide these services.  Your insurance has been verified to be:  [employer] Your primary doctor is:  Dr. Chrystine Oiler  Pertinent information will be shared with your doctor and your insurance company.  Social Worker:   [place], [place] or ([phone]   Information discussed with and copy given to patient by: Lennart Pall, 03/13/2015, 3:04 PM

## 2015-03-15 NOTE — Progress Notes (Signed)
Occupational Therapy Session Note  Patient Details  Name: Jamie Martinez MRN: [id number] Date of Birth: Aug 10, 1956  Today's Date: 03/15/2015 OT Individual Time: 0700-0800 OT Individual Time Calculation (min): 60 min    Short Term Goals: Week 1:  OT Short Term Goal 1 (Week 1): STGs=LTGs due to short ELOS  Skilled Therapeutic Interventions/Progress Updates:    Pt engaged in bathing and dressing tasks with focus on bed mobility, sit<>stand, standing balance, functional transfers, and functional amb with RW.  Pt elected to bathe seated EOB this morning and also elected to not don pants while she is donning KI.  Pt was able to thread underpants and pull them over hips without use of AE this morning.  Pt completed all tasks at supervision level.  Therapy Documentation Precautions:  Precautions Precautions: Knee, Fall Required Braces or Orthoses: Knee Immobilizer - Right, Knee Immobilizer - Left Knee Immobilizer - Right: Discontinue once straight leg raise with < 10 degree lag Knee Immobilizer - Left: Discontinue once straight leg raise with < 10 degree lag Restrictions Weight Bearing Restrictions: Yes RLE Weight Bearing: Weight bearing as tolerated LLE Weight Bearing: Weight bearing as tolerated Pain: Pain Assessment Pain Assessment: 0-10 Pain Score: 8  Pain Location: Knee Pain Orientation: Right;Left Pain Descriptors / Indicators: Aching Pain Onset: On-going Pain Intervention(s): RN made aware;Repositioned  See FIM for current functional status  Therapy/Group: Individual Therapy  Leroy Libman 03/15/2015, 8:01 AM

## 2015-03-15 NOTE — Plan of Care (Signed)
Problem: RH PAIN MANAGEMENT Goal: RH STG PAIN MANAGED AT OR BELOW PT'S PAIN GOAL < 5  Outcome: Not Progressing Pain fluctuates between 7 & 9

## 2015-03-15 NOTE — Progress Notes (Signed)
ANTICOAGULATION CONSULT NOTE - Follow Up Consult  Pharmacy Consult for coumadin Indication: VTE prophylaxis  No Known Allergies  Patient Measurements: Height: 5\' 8"  (172.7 cm) Weight: 205 lb 1.6 oz (93.033 kg) IBW/kg (Calculated) : 63.9  Vital Signs: Temp: 98.7 F (37.1 C) (03/24 0558) Temp Source: Oral (03/24 0558) BP: 125/65 mmHg (03/24 0558) Pulse Rate: 82 (03/24 0558)  Labs:  Recent Labs  03/12/15 1602 03/13/15 0730 03/13/15 1854 03/14/15 0820 03/15/15 0742  HGB 10.1*  --   --   --   --   HCT 30.3*  --   --   --   --   PLT 176  --   --   --   --   LABPROT  --  22.1*  --  27.9* 30.1*  INR  --  1.92*  --  2.58* 2.84*  CREATININE 0.55  --  0.49*  --   --     Estimated Creatinine Clearance: 92.5 mL/min (by C-G formula based on Cr of 0.49).   Medications:  Scheduled:  . docusate sodium  100 mg Oral BID  . letrozole  2.5 mg Oral Daily  . levothyroxine  300 mcg Oral QPM  . metoCLOPramide  10 mg Oral TID AC & HS  . polyethylene glycol  17 g Oral BID  . Warfarin - Pharmacist Dosing Inpatient   Does not apply q1800    Assessment: 59 yo female is currently on therapeutic coumadin for VTE prophylaxis s/p ortho surgery.  INR today is up to 2.84.  Goal of Therapy:  INR 2-3 Monitor platelets by anticoagulation protocol: Yes   Plan:  - coumadin 5 mg po x1 - INR in am  Jamie Martinez 03/15/2015,1:10 PM

## 2015-03-15 NOTE — IPOC Note (Signed)
Overall Plan of Care [facility]) Patient Details Name: Jamie Martinez MRN: [id number] DOB: 05-11-1956  Admitting Diagnosis: [facility] Problems: Principal Problem:   Status post total bilateral knee replacement Active Problems:   OA (osteoarthritis) of knee   Acute blood loss anemia   Status post bilateral knee replacements     Functional Problem List: Nursing Bowel, Medication Management, Motor, Pain, Skin Integrity  PT Balance, Edema, Endurance, Motor, Nutrition, Pain, Safety  OT Pain, Balance, Safety, Edema, Endurance, Motor  SLP    TR         Basic ADL's: OT Grooming, Bathing, Dressing, Toileting     Advanced  ADL's: OT Simple Meal Preparation, Laundry     Transfers: PT Bed Mobility, Bed to Chair, Car, Manufacturing systems engineer, Metallurgist: PT Ambulation, Emergency planning/management officer, Stairs     Additional Impairments: OT    SLP        TR      Anticipated Outcomes Item Anticipated Outcome  Self Feeding n/a  Swallowing      Basic self-care  Mod I  Toileting  Mod I   Bathroom Transfers Mod I  Bowel/Bladder  Mod I  Transfers  mod I   Locomotion  mod I ambulatory  Communication     Cognition     Pain  < 5  Safety/Judgment  N/A   Therapy Plan: PT Intensity: Minimum of 1-2 x/day ,45 to 90 minutes PT Frequency: 5 out of 7 days PT Duration Estimated Length of Stay: 7-9 days OT Intensity: Minimum of 1-2 x/day, 45 to 90 minutes OT Frequency: 5 out of 7 days OT Duration/Estimated Length of Stay: 7-9 days         Team Interventions: Nursing Interventions Bowel Management, Pain Management, Skin Care/Wound Management  PT interventions Ambulation/gait training, Training and development officer, Community reintegration, Discharge planning, DME/adaptive equipment instruction, Functional electrical stimulation, Functional mobility training, Neuromuscular re-education, Pain management, Patient/family education, Psychosocial support, Stair training,  Therapeutic Activities, Therapeutic Exercise, UE/LE Strength taining/ROM, UE/LE Coordination activities, Wheelchair propulsion/positioning  OT Interventions Training and development officer, Academic librarian, Discharge planning, DME/adaptive equipment instruction, Functional mobility training, Neuromuscular re-education, Pain management, Psychosocial support, Patient/family education, Self Care/advanced ADL retraining, Therapeutic Activities, Therapeutic Exercise, UE/LE Strength taining/ROM, Splinting/orthotics, UE/LE Coordination activities, Wheelchair propulsion/positioning  SLP Interventions    TR Interventions    SW/CM Interventions Discharge Planning, Barrister's clerk, Patient/Family Education    Team Discharge Planning: Destination: PT-Home ,OT- Home , SLP-  Projected Follow-up: PT-Home health PT, OT-  Other (comment) (TBD), SLP-  Projected Equipment Needs: PT-Rolling walker with 5" wheels, OT- To be determined, SLP-  Equipment Details: PT- , OT-  Patient/family involved in discharge planning: PT- Patient, Family member/caregiver,  OT-Patient, SLP-   MD ELOS: 6 days Medical Rehab Prognosis:  Excellent Assessment: The patient has been admitted for CIR therapies with the diagnosis of bilateral OA of knees s/p tka's. The team will be addressing functional mobility, strength, stamina, balance, safety, adaptive techniques and equipment, self-care, bowel and bladder mgt, patient and caregiver education, pain control, knee rom, activity tolerance, nutrition. Goals have been set at Duane Lope, MD, [facility]      See Team Conference Notes for weekly updates to the plan of care

## 2015-03-15 NOTE — Progress Notes (Signed)
[place] PHYSICAL MEDICINE & REHABILITATION     PROGRESS NOTE    Subjective/Complaints: Feeling much better this morning. Wanted to know if she could have something a little stronger than tylenol for pain  Objective: Vital Signs: Blood pressure 125/65, pulse 82, temperature 98.7 F (37.1 C), temperature source Oral, resp. rate 19, height 5\' 8"  (1.727 m), weight 93.033 kg (205 lb 1.6 oz), SpO2 95 %. Dg Abd 1 View  03/13/2015   CLINICAL DATA:  Vomiting and abdominal distention today. History of colon cancer previous colon surgery.  EXAM: ABDOMEN - 1 VIEW  COMPARISON:  CT 02/23/2015  FINDINGS: There is moderate air throughout the colon. Surgical suture line is seen over the left lower quadrant/left pelvis. There are several air-filled loops of small bowel in the right mid abdomen at the upper limits of normal in diameter. No free peritoneal air. Remainder the exam is within normal.  IMPRESSION: Several air-filled small bowel loops at the upper limits of normal in diameter with air throughout the colon. Findings may be due to ileus versus early/partial obstructive process.   Electronically Signed   By: Marin Olp M.D.   On: 03/13/2015 16:30   Dg Abd Portable 1v  03/14/2015   CLINICAL DATA:  Postoperative ileus  EXAM: PORTABLE ABDOMEN - 1 VIEW  COMPARISON:  03/13/2015  FINDINGS: Decreasing gaseous distention of large and small bowel. Gas throughout nondistended bowel loops. No free air organomegaly.  IMPRESSION: Improving bowel gas pattern/ileus.   Electronically Signed   By: Rolm Baptise M.D.   On: 03/14/2015 09:52    Recent Labs  03/12/15 1602  WBC 6.1  HGB 10.1*  HCT 30.3*  PLT 176    Recent Labs  03/12/15 1602 03/13/15 1854  NA 135 131*  K 4.0 4.3  CL 97 93*  GLUCOSE 130* 158*  BUN 10 12  CREATININE 0.55 0.49*  CALCIUM 8.4 8.3*   CBG (last 3)  No results for input(s): GLUCAP in the last 72 hours.  Wt Readings from Last 3 Encounters:  03/14/15 93.033 kg (205 lb 1.6 oz)   03/07/15 92.987 kg (205 lb)    Physical Exam:  Constitutional: She appears well-developed.  HENT: oral mucosa pink and moist Head: Normocephalic.  Eyes: EOM are normal.  Neck: Normal range of motion. Neck supple. No thyromegaly present.  Cardiovascular: Normal rate and regular rhythm. no murmur Respiratory: Effort normal and breath sounds normal. No respiratory distress. no wheezes GI: Soft. Bowel sounds are improving. She exhibits minimal distension or pain.  Skin:  Bilateral knee incisions are clean. Mild erythema at incisions Neuro: Motor strength is 5/5 bilateral deltoids, biceps, triceps, grip 2 minus bilateral hip flexors and knee extensors 3 + to 4/5 ankle dorsiflexors Sensation paresthesias bilateral feet to light touch. Cognitively appropriate Psych: pleasant and appropriate Assessment/Plan: 1. Functional deficits secondary to bilateral OA of knees s/p bilateral TKA's which require 3+ hours per day of interdisciplinary therapy in a comprehensive inpatient rehab setting. Physiatrist is providing close team supervision and 24 hour management of active medical problems listed below. Physiatrist and rehab team continue to assess barriers to discharge/monitor patient progress toward functional and medical goals.     FIM: FIM - Bathing Bathing Steps Patient Completed: Chest, Right Arm, Left Arm, Abdomen, Front perineal area Bathing: 3: Mod-Patient completes 5-7 69f 10 parts or 50-74%  FIM - Upper Body Dressing/Undressing Upper body dressing/undressing steps patient completed: Thread/unthread right sleeve of pullover shirt/dresss, Thread/unthread left sleeve of pullover shirt/dress, Put head  through opening of pull over shirt/dress, Pull shirt over trunk Upper body dressing/undressing: 3: Mod-Patient completed 50-74% of tasks FIM - Lower Body Dressing/Undressing Lower body dressing/undressing: 0: Activity did not occur  FIM - Toileting Toileting steps completed by  patient: Adjust clothing prior to toileting, Adjust clothing after toileting Toileting: 3: Mod-Patient completed 2 of 3 steps  FIM - Radio producer Devices: Walker, Grab bars, Elevated toilet seat Toilet Transfers: 4-To toilet/BSC: Min A (steadying Pt. > 75%), 4-From toilet/BSC: Min A (steadying Pt. > 75%)  FIM - Bed/Chair Transfer Bed/Chair Transfer Assistive Devices: Walker, Arm rests Bed/Chair Transfer: 5: Supine > Sit: Supervision (verbal cues/safety issues), 5: Chair or W/C > Bed: Supervision (verbal cues/safety issues), 5: Sit > Supine: Supervision (verbal cues/safety issues), 5: Bed > Chair or W/C: Supervision (verbal cues/safety issues)  FIM - Locomotion: Wheelchair Distance: 150 Locomotion: Wheelchair: 5: Travels 150 ft or more: maneuvers on rugs and over door sills with supervision, cueing or coaxing FIM - Locomotion: Ambulation Locomotion: Ambulation Assistive Devices: Administrator Ambulation/Gait Assistance: 5: Supervision, 4: Min guard Locomotion: Ambulation: 1: Travels less than 50 ft with minimal assistance (Pt.>75%)  Comprehension Comprehension Mode: Auditory Comprehension: 7-Follows complex conversation/direction: With no assist  Expression Expression Mode: Verbal Expression: 7-Expresses complex ideas: With no assist  Social Interaction Social Interaction: 6-Interacts appropriately with others with medication or extra time (anti-anxiety, antidepressant).  Problem Solving Problem Solving: 5-Solves basic problems: With no assist  Memory Memory: 7-Complete Independence: No helper  Medical Problem List and Plan: 1. Functional deficits secondary to end-stage osteoarthritis status post bilateral total knee arthroplasty 03/07/2015 2. DVT Prophylaxis/Anticoagulation: Coumadin --left peroneal DVT's 3. Pain Management: utilize low dose tramadol as opposed to oxycodone for now 4. Acute blood loss anemia. S/p blood transfusion. Follow-up  CBC. Hold niferex for now 5. Neuropsych: This patient is capable of making decisions on her own behalf. 6. Skin/Wound Care: Routine skin checks 7. Fluids/Electrolytes/Nutrition: clears 8. Hypothyroidism. Synthroid 9. Constipation: ileus  -advance to full liquid diet  -change to po reglan  -kub improved yesterday  -miralax, colace  LOS (Days) 3 A FACE TO FACE EVALUATION WAS PERFORMED  SWARTZ,ZACHARY T 03/15/2015 7:57 AM

## 2015-03-15 NOTE — Progress Notes (Signed)
Physical Therapy Session Note  Patient Details  Name: Jamie Martinez MRN: [id number] Date of Birth: 1956-02-04  Today's Date: 03/15/2015 PT Individual Time: 0800-0900 PT Individual Time Calculation (min): 60 min   Short Term Goals: Week 1:  PT Short Term Goal 1 (Week 1): = LTGs of overall mod I  Skilled Therapeutic Interventions/Progress Updates:   Patient received semi reclined in bed. Session focused on ambulation, stairs, transfers, knee ROM/strengthening, and activity tolerance. Patient transferred to edge of bed and performed stand pivot transfer using RW to wheelchair with supervision. Gait training room <> therapy gym 2 x 150 ft with supervision and verbal cues for upright posture and forward gaze. Patient negotiated up/down 4 stairs using 2 rails with supervision and step-to pattern, ascending with RLE and descending with LLE. To simulate home entry, patient negotiated up/down 8 stairs using L rail laterally with supervision. Patient c/o knee pain 8-9/10, RN notified and present to administer pain medication. Patient transferred sit <> supine on mat with supervision for B knee therex: heel slides x 8 each LE, SLR x 8 each LE with AAROM LLE, hip abduction x 8 each LE. Reviewed supine/sitting HEP handout with patient and answered all questions. Patient returned to room and left sitting in wheelchair with all needs within reach.  Therapy Documentation Precautions:  Precautions Precautions: Knee, Fall Required Braces or Orthoses: Knee Immobilizer - Right, Knee Immobilizer - Left Knee Immobilizer - Right: Discontinue once straight leg raise with < 10 degree lag Knee Immobilizer - Left: Discontinue once straight leg raise with < 10 degree lag Restrictions Weight Bearing Restrictions: Yes RLE Weight Bearing: Weight bearing as tolerated LLE Weight Bearing: Weight bearing as tolerated Pain: Pain Assessment Pain Assessment: 0-10 Pain Score: 7  Pain Type: Acute pain Pain Location:  Knee Pain Orientation: Left;Right Pain Descriptors / Indicators: Sore Pain Onset: On-going Pain Intervention(s): Medication (See eMAR) Locomotion : Ambulation Ambulation/Gait Assistance: 5: Supervision   See FIM for current functional status  Therapy/Group: Individual Therapy  Laretta Alstrom 03/15/2015, 10:30 AM

## 2015-03-15 NOTE — Progress Notes (Signed)
Occupational Therapy Note  Patient Details  Name: Jamie Martinez MRN: [id number] Date of Birth: Sep 15, 1956  Today's Date: 03/15/2015 OT Individual Time: 1300-1400 OT Individual Time Calculation (min): 60 min   Pt c/o 7/10 pain in bilateral knees; RN aware, repositioned, and ice packs applied Individual Therapy  Pt resting in bed with husband present.  Pt amb with RW to ADL apartment and tub room to practice tub bench transfers.  Pt performed tub bench transfer with supervision.  Pt also engaged in functional amb with RW for home mgmt tasks.  Husband provided appropriate level of supervision and assistance.  Pt returned to room and practiced transfers in/out of bed at elevated height to simulate bed height at home.  Pt initially stated she need assistance to get BLE into bed but she was able to accomplish independently after encouraged by husband.    Leotis Shames [facility] 03/15/2015, 2:03 PM

## 2015-03-16 ENCOUNTER — Inpatient Hospital Stay (HOSPITAL_COMMUNITY): Payer: PRIVATE HEALTH INSURANCE | Admitting: Physical Therapy

## 2015-03-16 ENCOUNTER — Inpatient Hospital Stay (HOSPITAL_COMMUNITY): Payer: PRIVATE HEALTH INSURANCE

## 2015-03-16 LAB — PROTIME-INR
INR: 2.6 — AB (ref 0.00–1.49)
Prothrombin Time: 28 seconds — ABNORMAL HIGH (ref 11.6–15.2)

## 2015-03-16 MED ORDER — METHOCARBAMOL 500 MG PO TABS: 500.0000 mg | ORAL_TABLET | Freq: Four times a day (QID) | ORAL | Status: DC | PRN

## 2015-03-16 MED ORDER — LETROZOLE 2.5 MG PO TABS: 2.5000 mg | ORAL_TABLET | Freq: Every day | ORAL | Status: DC

## 2015-03-16 MED ORDER — METHOCARBAMOL 500 MG PO TABS
500.0000 mg | ORAL_TABLET | Freq: Four times a day (QID) | ORAL | Status: DC | PRN
Start: 2015-03-16 — End: 2015-04-23

## 2015-03-16 MED ORDER — OXYCODONE HCL 5 MG PO TABS
5.0000 mg | ORAL_TABLET | ORAL | Status: DC | PRN
Start: 2015-03-16 — End: 2015-04-23

## 2015-03-16 MED ORDER — WARFARIN SODIUM 5 MG PO TABS: ORAL_TABLET | ORAL | Status: DC

## 2015-03-16 MED ORDER — WARFARIN SODIUM 5 MG PO TABS
5.0000 mg | ORAL_TABLET | Freq: Every day | ORAL | Status: DC
  Administered 2015-03-16: 5 mg via ORAL
  Filled 2015-03-16 (×2): qty 1

## 2015-03-16 MED ORDER — POLYETHYLENE GLYCOL 3350 17 G PO PACK: 17.0000 g | PACK | Freq: Two times a day (BID) | ORAL | Status: DC

## 2015-03-16 MED ORDER — METOCLOPRAMIDE HCL 10 MG PO TABS
10.0000 mg | ORAL_TABLET | Freq: Three times a day (TID) | ORAL | Status: DC
Start: 2015-03-16 — End: 2015-04-23

## 2015-03-16 MED ORDER — LEVOTHYROXINE SODIUM 300 MCG PO TABS: 300.0000 ug | ORAL_TABLET | Freq: Every evening | ORAL | Status: DC

## 2015-03-16 MED ORDER — WARFARIN SODIUM 5 MG PO TABS
5.0000 mg | ORAL_TABLET | Freq: Once | ORAL | Status: AC
  Filled 2015-03-16: qty 1

## 2015-03-16 MED ORDER — DOCUSATE SODIUM 100 MG PO CAPS: 100.0000 mg | ORAL_CAPSULE | Freq: Two times a day (BID) | ORAL | Status: DC

## 2015-03-16 NOTE — Progress Notes (Signed)
Social Work  Discharge Note  The overall goal for the admission was met for:   Discharge location: Yes - home with family to assist as needed  Length of Stay: Yes - 5 days (with 3/26 discharge)  Discharge activity level: Yes - modified independent  Home/community participation: Yes  Services provided included: MD, RD, PT, OT, RN, TR, Pharmacy and SW  Financial Services: Private Insurance: [employer]  Follow-up services arranged: Home Health: Therapist, sports, PT via [facility], DME: 3n1 commode and CPM via [place] and Patient/Family has no preference for HH/DME agencies  Comments (or additional information):  Patient/Family verbalized understanding of follow-up arrangements: Yes  Individual responsible for coordination of the follow-up plan: patient  Confirmed correct DME delivered: Shadeed Colberg 03/16/2015    Zarahi Fuerst

## 2015-03-16 NOTE — Progress Notes (Signed)
[place] PHYSICAL MEDICINE & REHABILITATION     PROGRESS NOTE    Subjective/Complaints: Feeling well. No new complaints. Knee ROM still an issue  Objective: Vital Signs: Blood pressure 138/74, pulse 86, temperature 98.5 F (36.9 C), temperature source Oral, resp. rate 19, height 5\' 8"  (1.727 m), weight 93.033 kg (205 lb 1.6 oz), SpO2 95 %. Dg Abd Portable 1v  03/14/2015   CLINICAL DATA:  Postoperative ileus  EXAM: PORTABLE ABDOMEN - 1 VIEW  COMPARISON:  03/13/2015  FINDINGS: Decreasing gaseous distention of large and small bowel. Gas throughout nondistended bowel loops. No free air organomegaly.  IMPRESSION: Improving bowel gas pattern/ileus.   Electronically Signed   By: Rolm Baptise M.D.   On: 03/14/2015 09:52   No results for input(s): WBC, HGB, HCT, PLT in the last 72 hours.  Recent Labs  03/13/15 1854  NA 131*  K 4.3  CL 93*  GLUCOSE 158*  BUN 12  CREATININE 0.49*  CALCIUM 8.3*   CBG (last 3)  No results for input(s): GLUCAP in the last 72 hours.  Wt Readings from Last 3 Encounters:  03/14/15 93.033 kg (205 lb 1.6 oz)  03/07/15 92.987 kg (205 lb)    Physical Exam:  Constitutional: She appears well-developed.  HENT: oral mucosa pink and moist Head: Normocephalic.  Eyes: EOM are normal.  Neck: Normal range of motion. Neck supple. No thyromegaly present.  Cardiovascular: Normal rate and regular rhythm. no murmur Respiratory: Effort normal and breath sounds normal. No respiratory distress. no wheezes GI: Soft. Bowel sounds are+. She exhibits minimal distension or pain.  Skin:  Bilateral knee incisions are clean. Mild erythema at incisions Neuro: Motor strength is 5/5 bilateral deltoids, biceps, triceps, grip 2 minus bilateral hip flexors and knee extensors 3 + to 4/5 ankle dorsiflexors Sensation paresthesias bilateral feet to light touch. Cognitively appropriate Psych: pleasant and appropriate M/S: knee rom 65-75 degrees  Assessment/Plan: 1.  Functional deficits secondary to bilateral OA of knees s/p bilateral TKA's which require 3+ hours per day of interdisciplinary therapy in a comprehensive inpatient rehab setting. Physiatrist is providing close team supervision and 24 hour management of active medical problems listed below. Physiatrist and rehab team continue to assess barriers to discharge/monitor patient progress toward functional and medical goals.     FIM: FIM - Bathing Bathing Steps Patient Completed: Chest, Right Arm, Left Arm, Abdomen, Front perineal area, Buttocks, Right upper leg, Left upper leg Bathing: 4: Min-Patient completes 8-9 90f 10 parts or 75+ percent  FIM - Upper Body Dressing/Undressing Upper body dressing/undressing steps patient completed: Thread/unthread right sleeve of pullover shirt/dresss, Thread/unthread left sleeve of pullover shirt/dress, Put head through opening of pull over shirt/dress, Pull shirt over trunk Upper body dressing/undressing: 5: Set-up assist to: Obtain clothing/put away FIM - Lower Body Dressing/Undressing Lower body dressing/undressing steps patient completed: Thread/unthread right underwear leg, Thread/unthread left underwear leg, Pull underwear up/down Lower body dressing/undressing: 3: Mod-Patient completed 50-74% of tasks  FIM - Toileting Toileting steps completed by patient: Adjust clothing prior to toileting, Adjust clothing after toileting Toileting: 3: Mod-Patient completed 2 of 3 steps  FIM - Radio producer Devices: Walker, Grab bars, Elevated toilet seat Toilet Transfers: 4-To toilet/BSC: Min A (steadying Pt. > 75%), 4-From toilet/BSC: Min A (steadying Pt. > 75%)  FIM - Bed/Chair Transfer Bed/Chair Transfer Assistive Devices: Walker, Arm rests Bed/Chair Transfer: 5: Supine > Sit: Supervision (verbal cues/safety issues), 5: Chair or W/C > Bed: Supervision (verbal cues/safety issues), 5: Sit > Supine:  Supervision (verbal cues/safety issues),  5: Bed > Chair or W/C: Supervision (verbal cues/safety issues)  FIM - Locomotion: Wheelchair Distance: 150 ft Locomotion: Wheelchair: 5: Travels 150 ft or more: maneuvers on rugs and over door sills with supervision, cueing or coaxing FIM - Locomotion: Ambulation Locomotion: Ambulation Assistive Devices: Administrator Ambulation/Gait Assistance: 6: Modified independent (Device/Increase time) Locomotion: Ambulation: 5: Travels 150 ft or more with supervision/safety issues  Comprehension Comprehension Mode: Auditory Comprehension: 7-Follows complex conversation/direction: With no assist  Expression Expression Mode: Verbal Expression: 7-Expresses complex ideas: With no assist  Social Interaction Social Interaction: 6-Interacts appropriately with others with medication or extra time (anti-anxiety, antidepressant).  Problem Solving Problem Solving: 5-Solves basic problems: With no assist  Memory Memory: 7-Complete Independence: No helper  Medical Problem List and Plan: 1. Functional deficits secondary to end-stage osteoarthritis status post bilateral total knee arthroplasty 03/07/2015 2. DVT Prophylaxis/Anticoagulation: Coumadin --left peroneal DVT's--continue coumadin for a month 3. Pain Management: utilize low dose tramadol as opposed to oxycodone for now 4. Acute blood loss anemia. S/p blood transfusion. Follow-up CBC. Hold niferex for now 5. Neuropsych: This patient is capable of making decisions on her own behalf. 6. Skin/Wound Care: Routine skin checks 7. Fluids/Electrolytes/Nutrition: eating well 8. Hypothyroidism. Synthroid 9. Constipation: ileus resolving  -advance to regular diet  -taper reglan  -miralax, colace  LOS (Days) 4 A FACE TO FACE EVALUATION WAS PERFORMED  Quynh Basso T 03/16/2015 8:21 AM

## 2015-03-16 NOTE — Discharge Summary (Signed)
NAMEANALYAH, MCCONNON NO.:  [id number]  MEDICAL RECORD NO.:  [id number]  LOCATION:                                FACILITY:  [facility]  PHYSICIAN:  Lauraine Rinne, P.A.  DATE OF BIRTH:  [birth year]  DATE OF ADMISSION:  03/12/2015 DATE OF DISCHARGE:  03/17/2015                              DISCHARGE SUMMARY   DISCHARGE DIAGNOSES: 1. Functional deficits secondary to end-stage osteoarthritis of     bilateral knees with total knee arthroplasty on March 07, 2015. 2. Coumadin for deep venous thrombosis prophylaxis with left peroneal     deep venous thrombosis. 3. Pain management. 4. Acute blood loss anemia. 5. Hypothyroidism. 6. Constipation with ileus resolving.  HISTORY OF PRESENT ILLNESS:  A 59 year old right-handed female with a history of breast and colon cancer, remote tobacco abuse, who presented with end-stage osteoarthritis of bilateral knees, no relief with conservative care.  The patient underwent elective bilateral total knee arthroplasty per Dr. Wynelle Link on  March 07, 2015, postoperative pain management.  Weightbearing as tolerated.  Placed on Coumadin for DVT prophylaxis x4 weeks postoperatively and then to begin aspirin therapy after Coumadin completed.  Acute blood loss anemia 7.2 and transfused. Physical and occupational therapy ongoing.  The patient was admitted for comprehensive rehab program.  PAST MEDICAL HISTORY:  See discharge diagnoses.  SOCIAL HISTORY:  Lives with spouse independently prior to admission.  FUNCTIONAL STATUS UPON ADMISSION TO REHAB SERVICES:  Moderate assist supine to sit, +2 physical assist sit to stand, moderate assist ambulate 5 feet rolling walker, min to mod assist, activities daily living.  PHYSICAL EXAMINATION:  VITAL SIGNS:  Blood pressure 110/67 pulse 96, temperature 98, respirations 17. GENERAL:  This is an alert female, in no acute distress. LUNGS:  Clear to auscultation. ABDOMEN:  Mildly distended.  Good bowel  sounds.  Nontender.  Bilateral knee incisions clean and dry.  Mild erythema at incisions.  REHABILITATION HOSPITAL COURSE:  The patient was admitted to inpatient rehab services with therapies initiated on a 3-hour daily basis consisting of physical therapy, occupational therapy, and rehabilitation nursing.  The following issues were addressed during the patient's rehabilitation stay.  Pertaining to Ms. Koebel bilateral total knee arthroplasty per Dr. Wynelle Link.  Neurovascular sensation intact. Weightbearing as tolerated.  She would follow up with Orthopedic Services.  She had been placed on Coumadin for DVT prophylaxis.  Venous Doppler studies completed on March 14, 2015, showed acute DVT left peroneal vein.  She had been also on Lovenox until Coumadin therapeutic between 2.00 to 3.00.  In light of her DVT, she would remain on Coumadin therapy and follow up with her primary care doctor.  She did remain on Coumadin for 3 months.  Repeat venous Doppler studies as needed.  She remained asymptomatic.  Hospital course complicated by ileus.  It was noted that the patient had a history of constipation prior to admission.  She was using MiraLAX as well as Colace at home.  She was also placed on Reglan.  Diet downgraded to slowly advanced.  No nausea or vomiting.  Bowel program was regulated.  Blood pressures remained well controlled.  She did continue on Synthroid for hypothyroidism.  The patient received weekly collaborative interdisciplinary team conferences to discuss estimated length of stay, family teaching, and any barriers to discharge.  She was ambulating 150 feet x 2 supervision.  Negotiating stairs with supervision.  Able to gather her belongings for activities of daily living.  Her strength and endurance continued to greatly improve. Encouraged with her overall progress and planned to discharge to home.  DISCHARGE MEDICATIONS: 1. Colace 100 mg p.o. b.i.d. 2. Femara 2.5 mg p.o.  daily. 3. Synthroid 300 mcg p.o. daily. 4. Robaxin 500 mg p.o. every 6 hours as needed for muscle spasms. 5. Reglan 10 mg p.o. t.i.d. 6. Oxycodone immediate release 5-15 mg every 3 hours as needed for     pain, dispense of 90 tablets. 7. MiraLAX 17 g p.o. b.i.d., hold for loose stools. 8. Coumadin 5 mg adjusted accordingly for an INR of 2.0 to 3.0.  Her     diet was as tolerated.  FOLLOWUP:  The patient would follow up Dr. Wynelle Link in 2 weeks, call for appointment, Dr. Alger Simons as needed, Dr. Maryland Pink, medical management.  A home health nurse will be arranged to draw for Coumadin therapy weekly as needed with a goal INR of 2.00 to 3.00 with noted left peroneal  DVTs.  She can continue Coumadin for 3 months, follow up venous Doppler studies as needed for resolution of DVT.  She could begin aspirin therapy 81 mg daily after Coumadin completed as needed.     Lauraine Rinne, P.A.     DA/MEDQ  D:  03/16/2015  T:  03/16/2015  Job:  [id number]  cc:   [place] Pink, MD Meredith Staggers, M.D. Gaynelle Arabian, M.D.

## 2015-03-16 NOTE — Progress Notes (Signed)
ANTICOAGULATION CONSULT NOTE - Follow Up Consult  Pharmacy Consult for Coumadin Indication: VTE prophylaxis  No Known Allergies  Patient Measurements: Height: 5\' 8"  (172.7 cm) Weight: 205 lb 1.6 oz (93.033 kg) IBW/kg (Calculated) : 63.9 Heparin Dosing Weight:   Vital Signs:    Labs:  Recent Labs  03/13/15 1854 03/14/15 0820 03/15/15 0742 03/16/15 0657  LABPROT  --  27.9* 30.1* 28.0*  INR  --  2.58* 2.84* 2.60*  CREATININE 0.49*  --   --   --     Estimated Creatinine Clearance: 92.5 mL/min (by C-G formula based on Cr of 0.49).   Medications:  Scheduled:  . docusate sodium  100 mg Oral BID  . letrozole  2.5 mg Oral Daily  . levothyroxine  300 mcg Oral QPM  . metoCLOPramide  10 mg Oral TID AC & HS  . polyethylene glycol  17 g Oral BID  . warfarin  5 mg Oral q1800  . Warfarin - Pharmacist Dosing Inpatient   Does not apply q1800    Assessment: 59yo female on Coumadin for VTE px s/p B-TKR.  INR 2.6 this AM.  CBC was stable on 3/21.  No bleeding problems noted.  Goal of Therapy:  INR 2-3 Monitor platelets by anticoagulation protocol: Yes   Plan:  Coumadin 5mg  today F/U in AM  Gracy Bruins, PharmD [facility]

## 2015-03-16 NOTE — Discharge Summary (Signed)
Discharge summary job # [id number]

## 2015-03-16 NOTE — Progress Notes (Signed)
Occupational Therapy Discharge Summary  Patient Details  Name: NHUNG DANKO MRN: [id number] Date of Birth: [birth year]   Patient has met 59 of 10 long term goals due to improved activity tolerance, improved balance, postural control, ability to compensate for deficits, functional use of  RIGHT lower and LEFT lower extremity and improved coordination.  Pt made steady progress with BADLs and IADLs during this admission.  Pt is mod I/I with BADLs and IADLs including simple meal prepr and laundry tasks. Pt's husband has been present and participated in therapy session. Patient to discharge at overall Modified Independent level.  Patient's care partner not necessary to provide the necessary physical and cognitive assistance at discharge secondary to modified independent goals.    Recommendation:  No skilled occupational therapy recommended at this time.  Equipment: BSC  Reasons for discharge: treatment goals met and discharge from hospital  Patient/family agrees with progress made and goals achieved: Yes  OT Discharge Precautions/Restrictions  Precautions Precautions: Knee;Fall Required Braces or Orthoses: Knee Immobilizer - Left Knee Immobilizer - Left: Discontinue once straight leg raise with < 10 degree lag Restrictions Weight Bearing Restrictions: Yes RLE Weight Bearing: Weight bearing as tolerated LLE Weight Bearing: Weight bearing as tolerated   Vision/Perception  Vision- History Baseline Vision/History: Wears glasses Wears Glasses: At all times Patient Visual Report: No change from baseline Vision- Assessment Vision Assessment?: No apparent visual deficits  Cognition Overall Cognitive Status: Within Functional Limits for tasks assessed Arousal/Alertness: Awake/alert Orientation Level: Oriented X4 Attention: Sustained;Selective Sustained Attention: Appears intact Selective Attention: Appears intact Memory: Appears intact Awareness: Appears intact Problem Solving:  Appears intact Problem Solving Impairment: Verbal basic;Functional basic Safety/Judgment: Appears intact Sensation Sensation Light Touch: Appears Intact Hot/Cold: Appears Intact Proprioception: Appears Intact Coordination Gross Motor Movements are Fluid and Coordinated: No Fine Motor Movements are Fluid and Coordinated: Yes Coordination and Movement Description: limited ROM in BLE Motor  Motor Motor: Within Functional Limits Motor - Skilled Clinical Observations: limited by pain in BLE Mobility  Bed Mobility Bed Mobility: Supine to Sit;Sit to Supine Supine to Sit: 6: Modified independent (Device/Increase time);HOB flat Sit to Supine: 6: Modified independent (Device/Increase time);HOB flat Transfers Sit to Stand: 6: Modified independent (Device/Increase time);With armrests;With upper extremity assist Stand to Sit: 6: Modified independent (Device/Increase time);With armrests;With upper extremity assist  Trunk/Postural Assessment  Cervical Assessment Cervical Assessment: Within Functional Limits Thoracic Assessment Thoracic Assessment: Within Functional Limits Lumbar Assessment Lumbar Assessment: Within Functional Limits Postural Control Postural Control: Deficits on evaluation Protective Responses: delayed  Balance Balance Balance Assessed: Yes Dynamic Sitting Balance Dynamic Sitting - Balance Support: Feet supported;During functional activity Dynamic Sitting - Level of Assistance: 6: Modified independent (Device/Increase time) Static Standing Balance Static Standing - Balance Support: Bilateral upper extremity supported;During functional activity Static Standing - Level of Assistance: 6: Modified independent (Device/Increase time) Dynamic Standing Balance Dynamic Standing - Balance Support: Bilateral upper extremity supported;During functional activity Dynamic Standing - Level of Assistance: 6: Modified independent (Device/Increase time) Extremity/Trunk Assessment RUE  Assessment RUE Assessment: Within Functional Limits LUE Assessment LUE Assessment: Within Functional Limits  See FIM for current functional status  Leroy Libman 03/16/2015, 8:51 AM

## 2015-03-16 NOTE — Progress Notes (Signed)
Occupational Therapy Session Note  Patient Details  Name: Jamie Martinez MRN: [id number] Date of Birth: 1956-11-14  Today's Date: 03/16/2015 OT Individual Time: 0800-0900 OT Individual Time Calculation (min): 60 min    Short Term Goals: Week 1:  OT Short Term Goal 1 (Week 1): STGs=LTGs due to short ELOS  Skilled Therapeutic Interventions/Progress Updates:    Pt resting in bed with husband at bedside.  Pt agreeable to bathing at shower level this morning.  Pt provided necessary and appropriate supervision.  Pt amb with RW safely in cluttered environment of room.  Pt doffed and donned KI correctly.  Pt completed all tasks at mod I level.  Pt and husband pleased with progress and ready to discharge home tomorrow.  Therapy Documentation Precautions:  Precautions Precautions: Knee, Fall Required Braces or Orthoses: Knee Immobilizer - Right, Knee Immobilizer - Left Knee Immobilizer - Right: Discontinue once straight leg raise with < 10 degree lag Knee Immobilizer - Left: Discontinue once straight leg raise with < 10 degree lag Restrictions Weight Bearing Restrictions: Yes RLE Weight Bearing: Weight bearing as tolerated LLE Weight Bearing: Weight bearing as tolerated   Pain: Pain Assessment Pain Assessment: 0-10 Pain Score: 7  Pain Type: Acute pain Pain Location: Knee Pain Orientation: Right;Left Pain Descriptors / Indicators: Aching;Sore Pain Onset: On-going Patients Stated Pain Goal: 2 Pain Intervention(s): Medication (See eMAR)  See FIM for current functional status  Therapy/Group: Individual Therapy  Leroy Libman 03/16/2015, 12:03 PM

## 2015-03-16 NOTE — Discharge Instructions (Signed)
Inpatient Rehab Discharge Instructions  Jamie Martinez Discharge date and time: No discharge date for patient encounter.   Activities/Precautions/ Functional Status: Activity: activity as tolerated Diet: regular diet Wound Care: keep wound clean and dry Functional status:  ___ No restrictions     ___ Walk up steps independently ___ 24/7 supervision/assistance   ___ Walk up steps with assistance ___ Intermittent supervision/assistance  ___ Bathe/dress independently ___ Walk with walker     ___ Bathe/dress with assistance ___ Walk Independently    ___ Shower independently _x__ Walk with assistance    ___ Shower with assistance ___ No alcohol     ___ Return to work/school ________   COMMUNITY REFERRALS UPON DISCHARGE:    Home Health:   PT      RN                    Agency: [facility] Phone: [phone]   Medical Equipment/Items Ordered: bedside commode and CPM                                                     Agency/Supplier:  [facility] @ [phone]    Home health RN check INR Tuesday 03/20/2015 results to Dr Maryland Pink [phone] fax [phone]    My questions have been answered and I understand these instructions. I will adhere to these goals and the provided educational materials after my discharge from the hospital.  Patient/Caregiver Signature _______________________________ Date __________  Clinician Signature _______________________________________ Date __________  Please bring this form and your medication list with you to all your follow-up doctor's appointments.

## 2015-03-16 NOTE — Progress Notes (Signed)
Physical Therapy Discharge Summary  Patient Details  Name: Jamie Martinez MRN: [id number] Date of Birth: [birth year]  Today's Date: 03/16/2015 PT Individual Time: 1000-1100 and 1405-1505 PT Individual Time Calculation (min): 60 min and 60 min  Patient has met 9 of 9 long term goals due to improved activity tolerance, improved balance, improved postural control, increased strength, increased range of motion, decreased pain, ability to compensate for deficits and functional use of  right lower extremity and left lower extremity.  Patient to discharge at an ambulatory level Modified Independent.   Patient's care partner is independent to provide the necessary physical assistance at discharge.  Reasons goals not met: NA  Recommendation:  Patient will benefit from ongoing skilled PT services in home health setting to continue to advance safe functional mobility, address ongoing impairments in bilateral knee range of motion, bilateral lower extremity strength, standing balance, activity tolerance, swelling, and pain management and minimize fall risk.  Equipment: No equipment provided-patient has RW  Reasons for discharge: treatment goals met and discharge from hospital  Patient/family agrees with progress made and goals achieved: Yes  Skilled Therapeutic Intervention Session 1: Patient seen with husband present for family training with focus on car transfer, ambulation, stairs, knee ROM/strengthening, and reviewing HEP. Patient requires supervision with cues for technique for car transfer and mod I for all mobility using RW. Patient able to perform LLE SLR with < 10 deg lag, therefore discontinued L knee immobilizer. See discharge summary below for further details. Patient made mod I in room and left sitting on bed with husband present.   Session 2: Focus on community and outdoor ambulation and furniture transfers with husband present for session. Gait training using RW on/off unit, on/off  elevators, throughout hospital lobby and carpeted gift shop, over thresholds, inclines/declines, uneven surfaces, and up/down 4 stairs laterally using 1 rail with overall mod I. Patient required seated rest breaks due to increased BLE pain. Patient performed transfers to a variety of surfaces (plush arm chair, benches, etc) using RW with mod I, increased time. With increase in pain, patient propelled wheelchair using BUE with supervision in outdoor environment. Patient and husband with no further questions regarding discharge.   PT Discharge  Precautions/Restrictions Precautions Precautions: Knee;Fall Restrictions Weight Bearing Restrictions: Yes RLE Weight Bearing: Weight bearing as tolerated LLE Weight Bearing: Weight bearing as tolerated Pain Pain Assessment Pain Assessment: 0-10 Pain Score: 8  Pain Type: Acute pain Pain Location: Knee Pain Orientation: Left;Right Pain Descriptors / Indicators: Aching;Sore Pain Onset: Gradual Patients Stated Pain Goal: 2 Pain Intervention(s): Medication (See eMAR) Vision/Perception   No changes from baseline  Cognition Overall Cognitive Status: Within Functional Limits for tasks assessed Arousal/Alertness: Awake/alert Orientation Level: Oriented X4 Attention: Sustained;Selective Sustained Attention: Appears intact Selective Attention: Appears intact Memory: Appears intact Safety/Judgment: Appears intact Sensation Sensation Light Touch: Appears Intact Hot/Cold: Appears Intact Proprioception: Appears Intact Coordination Gross Motor Movements are Fluid and Coordinated: No Fine Motor Movements are Fluid and Coordinated: Yes Coordination and Movement Description: limited ROM in BLE Motor  Motor Motor: Within Functional Limits Motor - Skilled Clinical Observations: limited by pain in BLE  Mobility Bed Mobility Bed Mobility: Supine to Sit;Sit to Supine Supine to Sit: 6: Modified independent (Device/Increase time);HOB flat Sit to Supine:  6: Modified independent (Device/Increase time);HOB flat Transfers Transfers: Yes Sit to Stand: 6: Modified independent (Device/Increase time);With armrests;With upper extremity assist Stand to Sit: 6: Modified independent (Device/Increase time);With armrests;With upper extremity assist Locomotion  Ambulation Ambulation: Yes Ambulation/Gait Assistance: 6: Modified  independent (Device/Increase time) Ambulation Distance (Feet): 150 Feet Assistive device: Rolling walker Gait Gait: Yes Gait Pattern: Impaired Gait Pattern: Decreased hip/knee flexion - right;Decreased hip/knee flexion - left;Decreased dorsiflexion - left;Decreased dorsiflexion - right;Step-through pattern Gait velocity: 10 MWT = 0.33 m/s Stairs / Additional Locomotion Stairs: Yes Stairs Assistance: 6: Modified independent (Device/Increase time) Stair Management Technique: One rail Left;Step to pattern;Sideways Number of Stairs: 8 Height of Stairs: 6 Ramp: 6: Modified independent (Device) Curb: 5: Supervision (using RW) Wheelchair Mobility Wheelchair Mobility: No (pt ambulatory)  Trunk/Postural Assessment  Cervical Assessment Cervical Assessment: Within Functional Limits Thoracic Assessment Thoracic Assessment: Within Functional Limits Lumbar Assessment Lumbar Assessment: Within Functional Limits Postural Control Postural Control: Deficits on evaluation Protective Responses: delayed/impaired due to knee pain/decreased ROM  Balance Balance Balance Assessed: Yes Dynamic Sitting Balance Dynamic Sitting - Balance Support: Feet supported;During functional activity Dynamic Sitting - Level of Assistance: 6: Modified independent (Device/Increase time) Static Standing Balance Static Standing - Balance Support: Bilateral upper extremity supported;During functional activity Static Standing - Level of Assistance: 6: Modified independent (Device/Increase time) Dynamic Standing Balance Dynamic Standing - Balance Support:  Bilateral upper extremity supported;During functional activity Dynamic Standing - Level of Assistance: 6: Modified independent (Device/Increase time) Extremity Assessment  RUE Assessment RUE Assessment: Within Functional Limits (5/5) LUE Assessment LUE Assessment: Within Functional Limits (5/5) RLE Assessment RLE Assessment: Exceptions to [facility] RLE PROM (degrees) Right Knee Extension: -8 (long sitting) Right Knee Flexion: 60 (sitting EOM) RLE Strength RLE Overall Strength: Due to pain;Deficits RLE Overall Strength Comments: able to perform SLR, 3+/5 throughout LLE Assessment LLE Assessment: Exceptions to [facility] LLE PROM (degrees) Left Knee Extension: lacking 10 deg (long sitting) Left Knee Flexion: 60 (sitting EOM) LLE Strength LLE Overall Strength: Deficits;Due to pain LLE Overall Strength Comments: able to perform SLR, 3+/5 throughout  See FIM for current functional status  Laretta Alstrom 03/16/2015, 7:59 AM

## 2015-03-17 DIAGNOSIS — M172 Bilateral post-traumatic osteoarthritis of knee: Secondary | ICD-10-CM

## 2015-03-17 LAB — PROTIME-INR
INR: 2.45 — ABNORMAL HIGH (ref 0.00–1.49)
PROTHROMBIN TIME: 26.7 s — AB (ref 11.6–15.2)

## 2015-03-17 NOTE — Progress Notes (Signed)
Patient discharge home at this time  accompanied by spouse with staff assistance via wheelchair. Patient alert and oriented X 4. Vital signs taken and recorded. Patient's stable. No signs and symptoms of respiratory distress noted. Discharged instructions and prescriptions was already given. Patient verbalizes understanding.

## 2015-03-17 NOTE — Progress Notes (Signed)
Jamie Martinez is a 59 y.o. female [birth year] [id number]  Subjective: No new complaints. No new problems. Slept well. Feeling OK.  Objective: Vital signs in last 24 hours: Temp:  [98.4 F (36.9 C)-98.5 F (36.9 C)] 98.4 F (36.9 C) (03/26 0545) Pulse Rate:  [90-91] 90 (03/26 0545) Resp:  [16-20] 16 (03/26 0545) BP: (122-140)/(74-77) 140/74 mmHg (03/26 0545) SpO2:  [95 %-96 %] 96 % (03/26 0545) Weight change:  Last BM Date: 03/16/15  Intake/Output from previous day: 03/25 0701 - 03/26 0700 In: 720 [P.O.:720] Out: -  Last cbgs: CBG (last 3)  No results for input(s): GLUCAP in the last 72 hours.   Physical Exam General: No apparent distress   HEENT: not dry Lungs: Normal effort. Lungs clear to auscultation, no crackles or wheezes. Cardiovascular: Regular rate and rhythm, no edema Abdomen: S/NT/ND; BS(+) Musculoskeletal:  unchanged Neurological: No new neurological deficits Wounds: B knee incisions are WNL  Skin: clear  Aging changes Mental state: Alert, oriented, cooperative    Lab Results: BMET    Component Value Date/Time   NA 131* 03/13/2015 1854   K 4.3 03/13/2015 1854   CL 93* 03/13/2015 1854   CO2 33* 03/13/2015 1854   GLUCOSE 158* 03/13/2015 1854   BUN 12 03/13/2015 1854   CREATININE 0.49* 03/13/2015 1854   CALCIUM 8.3* 03/13/2015 1854   GFRNONAA >90 03/13/2015 1854   GFRAA >90 03/13/2015 1854   CBC    Component Value Date/Time   WBC 6.1 03/12/2015 1602   RBC 3.40* 03/12/2015 1602   HGB 10.1* 03/12/2015 1602   HCT 30.3* 03/12/2015 1602   PLT 176 03/12/2015 1602   MCV 89.1 03/12/2015 1602   MCH 29.7 03/12/2015 1602   MCHC 33.3 03/12/2015 1602   RDW 13.6 03/12/2015 1602   LYMPHSABS 1.0 03/12/2015 1602   MONOABS 0.5 03/12/2015 1602   EOSABS 0.1 03/12/2015 1602   BASOSABS 0.0 03/12/2015 1602    Studies/Results: No results found.  Medications: I have reviewed the patient's current medications.  Assessment/Plan:  1. Functional deficits  secondary to end-stage osteoarthritis status post bilateral total knee arthroplasty 03/07/2015 2. DVT Prophylaxis/Anticoagulation: Coumadin --left peroneal DVT's--continue coumadin for a month 3. Pain Management: utilize low dose tramadol as opposed to oxycodone for now 4. Acute blood loss anemia. S/p blood transfusion. Follow-up CBC. Hold niferex for now 5. Neuropsych: This patient is capable of making decisions on her own behalf. 6. Skin/Wound Care: Routine skin checks 7. Fluids/Electrolytes/Nutrition: eating well 8. Hypothyroidism. Synthroid 9. Constipation: ileus resolving -advance to regular diet -taper reglan -miralax, colace  Will d/c home  Length of stay, days: 5  Walker Kehr , MD 03/17/2015, 7:59 AM

## 2015-03-23 ENCOUNTER — Ambulatory Visit
Admit: 2015-03-23 | Disposition: A | Discharge status: Home / Self Care | Payer: Self-pay | Attending: Oncology | Admitting: Oncology

## 2015-04-03 ENCOUNTER — Encounter
Admit: 2015-04-03 | Disposition: A | Discharge status: Home / Self Care | Payer: Self-pay | Attending: Orthopedic Surgery | Admitting: Orthopedic Surgery

## 2015-04-10 NOTE — H&P (Signed)
PATIENT NAME:  Jamie Martinez, Jamie Martinez MR#:  [id number] DATE OF BIRTH:  [birth year]  DATE OF ADMISSION:  12/08/2012  CHIEF COMPLAINT:  Sigmoid colon mass.   HISTORY OF PRESENT ILLNESS:  This is a patient with a sigmoid colon mass. She was recently admitted to the hospital with presumed diverticulitis which promptly resolved, but she was sent for a colonoscopy which demonstrated a near obstructing lesion very suspicious for cancer. She is here for elective sigmoid colectomy.   PAST MEDICAL HISTORY:  Left breast cancer, goiter, tobacco abuse.   PAST SURGICAL HISTORY:  Left breast conservation therapy, knee surgery, hysterectomy.   MEDICATIONS:   1.  Synthroid.  2.  Femara.  3.  Indomethacin.   ALLERGIES:  None.   FAMILY HISTORY:  Noncontributory.   SOCIAL HISTORY:  The patient smokes about 1 pack of cigarettes per day. Does not drink alcohol. Works in a [employer].   REVIEW OF SYSTEMS:  A 10 system review has been performed and negative with the exception of that mentioned in the HPI.   PHYSICAL EXAMINATION: GENERAL: Chronically ill-appearing Caucasian female patient.  HEENT: No scleral icterus.  NECK: No palpable neck nodes.  CHEST: Clear to auscultation.  CARDIAC: Regular rate and rhythm.  ABDOMEN: Soft, nontender.  EXTREMITIES: Without edema.  NEUROLOGIC: Grossly intact.  INTEGUMENT: No jaundice.   DIAGNOSTIC DATA:  Pathology report from a colonoscopy performed by Dr. Dionne Milo demonstrates an adenomatous lesion with high-grade dysplasia. Colonoscopy report suggests a partially obstructing large mass in the sigmoid colon 2 x 5 cm from the anal verge, partially circumferential.   ASSESSMENT AND PLAN:  This is a patient with a partially obstructing colon mass. She is here for elective sigmoid colectomy, possible low anterior resection. Rationale for this has been discussed. The options of observation have been reviewed and the risks of bleeding, infection, recurrence of disease, negative  study, anastomosis with anastomotic leak and colostomy have all been reviewed for her. She understood and agreed to proceed.   ____________________________ Jerrol Banana Burt Knack, MD rec:si D: 12/07/2012 22:32:06 ET T: 12/08/2012 00:11:51 ET JOB#: [id number]  cc: Jerrol Banana. Burt Knack, MD, <Dictator> Florene Glen MD ELECTRONICALLY SIGNED 12/08/2012 16:10

## 2015-04-10 NOTE — H&P (Signed)
PATIENT NAME:  Jamie Martinez, Jamie Martinez MR#:  [id number] DATE OF BIRTH:  01-08-1956  DATE OF ADMISSION:  09/26/2012  ATTENDING PHYSICIAN: Harrell Gave A. Princessa Lesmeister, M.D.   REASON FOR CONSULTATION: Abdominal pain suprapubic x4 days, emesis, constipation, obstipation, chills.   HISTORY OF PRESENT ILLNESS: Jamie Martinez is a pleasant 59 year old female with history of breast cancer status post partial mastectomy and sentinel lymph node biopsy, radiation therapy and now is on oral aromatase inhibitors x5 years who presents with four days of abdominal pain. She says that it initially was diffuse throughout her abdomen and describes it as cramping and since has moved to her suprapubic region. It was better yesterday and worse today and worse with movement. She says she has tried Alka-Seltzer and laxative thinking that it was gas and/or constipation initially and has not found improvement. She had never had this pain before. She has subjective chills. She had emesis x1 day a few days ago, but has not taken much p.o. since. She said her last bowel movement was a small amount today, but normally has regular bowel movements and has not really had a significant bowel movement since what's been going on. Last colonoscopy she said was greater than 20 years ago. No fevers, night sweats, shortness of breath, chest pain, dysuria, or hematuria, change in activity, headaches.   PAST MEDICAL HISTORY:  1. Left breast cancer T1 ER positive, status post partial mastectomy and sentinel lymph node biopsy in 2010 with Dr. Tamala Julian, is currently taking aromatase inhibitor.  2. Orthoscopic knee surgery. She says she is in need of bilateral knee replacements.  3. History goiter status post iodine ablation.  4. Hysterectomy approximately 20 years ago.  MEDICATIONS: 1. Synthroid 300 micrograms p.o. twice a day. I have confirmed this and she agrees that it is b.i.d.  2. Femara 2.5 mg p.o. daily.  3. Indomethacin 50 mg p.o. daily for gout which she  is almost done treating.   ALLERGIES: No known drug allergies.   SOCIAL HISTORY: Smokes 1 pack a day, she says for greater than 30 years. Does not drink. Works at [employer] and lives in [place]. She is here with her husband.   FAMILY HISTORY: Mother has history of cancer that she said began in kidneys and spread to the liver as well as cerebrovascular accident. Father is recently diagnosed and treated for lung cancer, has a history of cerebrovascular accident.   PHYSICAL EXAM:  VITAL SIGNS: Temperature 98.3, pulse 87, blood pressure 123/74, respirations 17, 95% on room air.   GENERAL: No acute distress, alert and oriented x3.   HEAD: Normocephalic, atraumatic.   EYES: No scleral icterus. No conjunctivitis. Pupils equal, round, reactive due to light. Extraocular motions intact.   FACE: No obvious trauma. Normal external ears. Normal external nose.   NECK: No obvious swellings or goiter.   CHEST: Lungs clear to auscultation, moving air well.   HEART: Regular rate and rhythm. No murmurs, rubs, or gallops.   ABDOMEN: Soft, mildly tender in the suprapubic region. No rebound. No peritonitis. Bowel sounds are hyperactive.   EXTREMITIES: Moves all extremities well. Strength five out of five in all four extremities.   NEUROLOGIC: Cranial nerves II through XII grossly intact. Sensation intact to all four extremities.   LABORATORY, RADIOLOGICAL AND DIAGNOSTIC DATA: I personally reviewed her labs. White cell count 7.1, hemoglobin and hematocrit 11.8 and 34.1, platelets 227. LFTs are normal. Albumin 2.9, creatinine 0.51, bicarbonate 28. CT: I have personally reviewed her CT. There is significant  sigmoid diverticulosis with thickening of her sigmoid colon. There appears to be a small area of potential abscess. No free air. No contained air.   ASSESSMENT AND PLAN: Jamie Martinez is a pleasant 59 year old female with history of breast cancer who presents with signs and symptoms and radiographic  imaging suggestive of diverticulitis. We will admit her, make her n.p.o., give her IV antibiotics when her pain is controlled. We will advance her diet and transition her to p.o. antibiotics. I have already explained to her the need for colonoscopy and due to the fact that this is a complicated diverticulitis, would recommend probable sigmoid colectomy in the future. She agrees with the plan.   ____________________________ Glena Norfolk. Jene Oravec, MD cal:ap D: 09/26/2012 21:07:26 ET T: 09/27/2012 07:44:54 ET JOB#: [id number]  cc: Harrell Gave A. Patina Spanier, MD, <Dictator> Floyde Parkins MD ELECTRONICALLY SIGNED 09/28/2012 9:21

## 2015-04-10 NOTE — Discharge Summary (Signed)
PATIENT NAME:  Jamie Martinez, Jamie Martinez MR#:  [id number] DATE OF BIRTH:  [birth year]  DATE OF ADMISSION:  09/26/2012 DATE OF DISCHARGE:  09/28/2012  DISCHARGE DIAGNOSES: Acute diverticulitis, history of breast cancer, history of goiter.   PROCEDURES: None.   HISTORY OF PRESENT ILLNESS/HOSPITAL COURSE: This is a patient who was admitted to the hospital with the diagnosis of acute diverticulitis. She is 59 years old and has a history of breast cancer, partial mastectomy and sentinel node biopsy several years ago by Dr. Tamala Julian. She was admitted to the hospital and started on IV antibiotics for presumed diverticulitis which was seen on CT scan. Her symptoms resolved promptly. Her white blood cell count improved considerably, being normal at the time of discharge. She is anemic, however, and will require luminal evaluation at some point in the form of a colonoscopy. She is discharged in stable condition at this point to follow up with Dr. Rexene Edison, her admitting physician, in our office in 10 days. She is given Bactrim and Flagyl and she is given Vicodin for pain if it were to return, but she is also instructed to return either to our office or to the Emergency Room should she have fevers or worsen at any time. She will follow up with Dr. Rexene Edison in his office in 7 to 10 days.     ____________________________ Jerrol Banana Burt Knack, MD rec:bjt D: 09/28/2012 16:52:19 ET T: 09/29/2012 10:33:58 ET JOB#: [id number]  cc: Jerrol Banana. Burt Knack, MD, <Dictator> Florene Glen MD ELECTRONICALLY SIGNED 09/29/2012 15:37

## 2015-04-10 NOTE — Op Note (Signed)
PATIENT NAME:  Jamie Martinez, Jamie Martinez MR#:  [id number] DATE OF BIRTH:  [birth year]  DATE OF PROCEDURE:  12/08/2012  PREOPERATIVE DIAGNOSIS: Left colon cancer.   POSTOPERATIVE DIAGNOSIS: Sigmoid colon cancer.   PROCEDURES:  1. Exploratory laparotomy.  2. Sigmoid colectomy with triangulated end-to-end anastomosis. 3. Incidental appendectomy. 4. Pelvic sidewall excision with left salpingo-oophorectomy and repair of bladder rent.   SURGEON: Phoebe Perch, MD   ASSISTANT: Sherri Rad MD   ANESTHESIA: General with endotracheal tube.   INDICATIONS: This is a patient with known near obstructing colon cancer in the left side. Preoperatively we discussed the rationale for surgery, the options of observation, risk of bleeding, infection, recurrence, needing additional therapy. We also discussed the risk of a colostomy, temporary or permanent, anastomotic leak and additional surgery. This was all reviewed for her and her family in the preop holding area. They understood and agreed to proceed.   FINDINGS: Large mass in the sigmoid colon which may have perforated into the left pelvic sidewall. The left tube, ovary,  bladder dome and pelvic sidewall were densely adherent to this colon mass, suspicious for previous perforation which would be consistent with her history of what was believed to be diverticulitis at a previous hospitalization. No palpable nodes and no palpable liver metastases; however, there were nodes present from probable tattooing in the area.   DESCRIPTION OF PROCEDURE: The patient was induced to general anesthesia. VTE prophylaxis was in place. She was placed in well-padded low lithotomy position. A Foley catheter was placed, and she was prepped and draped in a sterile fashion. A midline incision was utilized to open and explore the abdominal cavity. No other identifiable processes were found except for the large sigmoid colon mass densely adherent to the left pelvic sidewall. It was immobile.    Dissection was performed by taking down the lateral avascular line and dissecting both cephalad and caudad to this mass. The mass was then excised from the left pelvic sidewall with electrocautery after identifying the ureter and  keeping the ureter in view at all times.   The specimen was elevated. The GIA was utilized to divide the colon proximal, and then a TA was used to divide the colon distally, and mesenteric vessels were tied with 0 silk ligatures, and the specimen was sent off for examination.   An incidental appendectomy was then performed by removing the appendix in a typical fashion and ligating with 2-0 Vicryl both vessels and stump.   Attention was returned to the left pelvic sidewall where the mass had been densely adherent. There was granulation tissue present, and the concern about tumor being present in this area and how it extended onto the dome of the bladder and tube and ovary necessitated excision. Electrocautery was utilized, again keeping the ureter well away from this area, and dissection around the dome of the bladder and onto the tube and ovary was performed. The gonadal vessels were ligated with 0 silk ligatures and divided. The round ligament was divided in a similar fashion. Pelvic sidewall tissue, tube and ovary, and a portion of the bladder was sent off as well, all en bloc.   The area was irrigated with copious amounts of normal saline. There was no sign of bleeding and no sign of leakage from the staple lines; therefore, the staple lines were excised and a triangulation type end-to-end anastomosis was performed utilizing three firings of the TA-30 reinforced with 3-0 silk sutures, and this appeared to be under no tension and adequately closed.  Attention was turned to the pelvic sidewall where there was no further bleeding. Attention was turned to the bladder where the rent in the left dome of the bladder was closed with three layers of  2-0 Vicryl imbricating each  layer previous to the next. The Foley was left in place.    The area was irrigated with copious amounts of normal saline. Hemostasis was found to be adequate. The sponge, lap and needle count was correct. Omentum was placed over the anastomosis down into the pelvis between the anastomosis and the bladder and pelvic sidewall; and then once assuring that the sponge, lap and needle count was correct, the wound was closed first by placing a piece of Seprafilm over the omentum anteriorly and then closing the wound with running #1 PDS. Marcaine was infiltrated in the skin and subcutaneous tissues for a total of 30 mL. The subcutaneous tissues were irrigated with copious amounts of normal saline, and then skin staples were placed followed by sterile dressing.   The patient tolerated the procedure well. There were no complications other than the rent in the bladder which required repair due to tumor being adherent to the pelvic sidewall and left lateral dome of the bladder. Sponge, lap and needle count was correct. The estimated blood loss was 200 mL.  ____________________________ Jamie Martinez. Burt Knack, MD rec:cb D: 12/08/2012 13:10:55 ET T: 12/08/2012 13:54:25 ET JOB#: [id number]  cc: Jamie Martinez. Burt Knack, MD, <Dictator> Jamie Glen MD ELECTRONICALLY SIGNED 12/08/2012 16:11

## 2015-04-13 NOTE — Discharge Summary (Signed)
PATIENT NAME:  Jamie Martinez, Jamie Martinez MR#:  [id number] DATE OF BIRTH:  1956/04/26  DATE OF ADMISSION:  12/08/2012 DATE OF DISCHARGE:  12/18/2012  DISCHARGE DIAGNOSES: Colon cancer, diverticulitis, history of left breast cancer, history of goiter, history of tobacco abuse ongoing and hypothyroidism.   PROCEDURES: Exploratory laparotomy with sigmoid colon resection, left salpingo-oophorectomy, partial bladder excision with repair and excision of pelvic sidewall extension and primary anastomosis, incidental appendectomy.   HISTORY OF PRESENT ILLNESS AND HOSPITAL COURSE: This is a patient who was admitted to the hospital with a history of diverticulitis with likely perforation, who underwent a colonoscopy demonstrating a near obstructing lesion in the same area of her likely perforation, which had healed as an outpatient on oral antibiotics. She was taken to the Operating Room where the exploratory laparotomy revealed densely adherent colon mass adherent to the sidewall, involving the left salpinx and ovary and the dome of the bladder. This was excised and a primary anastomosis was performed. Because of the need to excise a portion of the bladder, a Foley catheter was left in place and removed on the day of her discharge. She did have a prolonged postoperative ileus, which resolved, and she is discharged in stable condition to resume all of her home medications. She is given Percocet for pain as needed. She is also instructed to follow up with oncology. Oncology consultation will be arranged as an outpatient. Her final staging was T3 N0.   ____________________________ Jerrol Banana. Burt Knack, MD rec:jm D: 12/31/2012 20:24:30 ET T: 01/01/2013 13:44:58 ET JOB#: [id number]  cc: Jerrol Banana. Burt Knack, MD, <Dictator> Florene Glen MD ELECTRONICALLY SIGNED 01/01/2013 18:55

## 2015-04-13 NOTE — Op Note (Signed)
PATIENT NAME:  Jamie Martinez, Jamie Martinez MR#:  [id number] DATE OF BIRTH:  [birth year]  DATE OF PROCEDURE:  01/19/2013  PREOPERATIVE DIAGNOSIS: Colon cancer.   POSTOPERATIVE DIAGNOSIS: Colon cancer.  PROCEDURE: Left subclavian vein port placement with fluoroscopy.   SURGEON: Richard E. Burt Knack, MD   ANESTHESIA: General by mask with local anesthetic.   INDICATION: This is a patient with colon cancer requiring port for fluoroscopy. Preoperatively we discussed rationale for surgery, the options of observation, risks of bleeding, infection, recurrence of disease, thrombosis, nonfunction, breakage, pneumothorax and hemopneumothorax any of which could require further surgery and/or chest tube placement. This was reviewed for she and her family in the preop holding area in detail. Questions were answered. They understood and agreed to proceed.   FINDINGS: Fluoroscopy demonstrated that the tip of the catheter was in the superior vena cava and a postoperative chest film was ordered.   DESCRIPTION OF PROCEDURE: The patient was induced to general anesthesia by mask. Local anesthetic was infiltrated into the skin and subcutaneous tissues, after she was prepped and draped in a sterile fashion and a surgical pause had been performed.   On the first attempt, a large-bore needle was placed into the subclavian vein, on the left side. Seldinger wire was placed. Fluoroscopy confirmed that the Seldinger wire was in the superior vena cava.   An incision was made and a port pocket was developed with blunt and electrocautery dissection. Once assuring that hemostasis was adequate, the introducer dilator was placed followed by placement of the port over the wire. The wire was removed. The length of the catheter was adjusted using fluoroscopy to place the tip of the catheter in the superior vena cava. It was trimmed and attached to the previously flushed port. It was aspirated and flushed for function and then the port was tied in  with 3-0 Prolenes. The area was checked for hemostasis and found to be adequate. The wound was then closed in layers with 3-0 Vicryl and 4-0 subcuticular Monocryl. Steri-Strips, Mastisol and sterile dressings were placed.   The patient tolerated the procedure well. There were no complications. She was taken to the recovery room in stable condition where a postoperative chest film will be ordered. She tolerated the procedure well and will be discharged in stable condition.  ____________________________ Jerrol Banana Burt Knack, MD rec:sb D: 01/19/2013 13:17:02 ET T: 01/19/2013 13:32:42 ET JOB#: [id number]  cc: Jerrol Banana. Burt Knack, MD, <Dictator> Florene Glen MD ELECTRONICALLY SIGNED 01/24/2013 19:18

## 2015-04-14 NOTE — Op Note (Signed)
PATIENT NAME:  Jamie Martinez, Jamie Martinez MR#:  [id number] DATE OF BIRTH:  02/14/1956  DATE OF PROCEDURE:  03/08/2014  PREOPERATIVE DIAGNOSIS: History of carcinoma.  POSTOPERATIVE DIAGNOSIS: History of carcinoma.  PROCEDURE: Port removal.   SURGEON: Reina Wilton E. Burt Knack, M.D.   ANESTHESIA: IV sedation and local anesthetic.   INDICATIONS: This is a patient, who desires port removal following chemotherapy for carcinoma. Preoperatively, we discussed rationale for surgery, the options of leaving the port in and the risks of bleeding, infection and cosmetic deformity. She understood and agreed to proceed.   FINDINGS: Port removal without difficulty. Specimens sent to path for gross only.   DESCRIPTION OF PROCEDURE: With the patient in the operating room., she was prepped and draped in a sterile fashion. A surgical pause was performed. She had IV sedation on board and local anesthetic was infiltrated into the skin and subcutaneous tissues around the palpable port and the old scar was incised. Sutures were cut. The capsule was opened and the port was removed  and pressure was held. No bleeding was noted. The wound was then closed with deep sutures of 3-0 Vicryl, followed by 4-0 subcuticular Monocryl. Steri-Strips and Mastisol were placed followed by a dressing. She was taken to the recovery room in stable condition to be discharged in the care of her family. Followup in 10 days as needed.   ____________________________ Jerrol Banana Burt Knack, MD rec:aw D: 03/08/2014 08:43:07 ET T: 03/08/2014 11:37:47 ET JOB#: [id number]  cc: Jerrol Banana. Burt Knack, MD, <Dictator> Florene Glen MD ELECTRONICALLY SIGNED 03/08/2014 14:08

## 2015-04-20 ENCOUNTER — Ambulatory Visit: Payer: Self-pay | Admitting: Orthopedic Surgery

## 2015-04-20 ENCOUNTER — Encounter (HOSPITAL_COMMUNITY): Payer: Self-pay | Admitting: *Deleted

## 2015-04-20 NOTE — Progress Notes (Signed)
Call placed to Dr. Anne Fu office re: Coumadin to find out if the Coumadin should be continued or stopped for surgery.  The patient said it was not discussed at her pre op visit with the doctor; she would continue her Coumadin.

## 2015-04-20 NOTE — Progress Notes (Signed)
Received message from Jamie Martinez at Dr Tresa Moore office- stated follow up from [facility] call-  Pt was to stop coumadin and patient and dr Wynelle Link have talked about this and pt has been instructed by him.  I called patient back and informed her she needs to call [facility] ortho and speak with triage nurse asap.  Patient had stated to me her last dose will be 04/23/15

## 2015-04-20 NOTE — Progress Notes (Signed)
Preoperative surgical orders have been place into the Epic hospital system for Jamie Martinez on 04/20/2015, 12:13 PM  by Mickel Crow for surgery on 04-23-2015.  Preop [place]nee orders including IV Tylenol and IV Decadron as long as there are no contraindications to the above medications. Arlee Muslim, PA-C

## 2015-04-22 DIAGNOSIS — T8482XA Fibrosis due to internal orthopedic prosthetic devices, implants and grafts, initial encounter: Secondary | ICD-10-CM | POA: Diagnosis present

## 2015-04-22 NOTE — H&P (Signed)
CC- Jamie Martinez is a 59 y.o. female who presents with bilateral knee stiffness.  HPI- . Knee Pain: Patient presents with stiffness involving  bilateral knees. Onset of the symptoms was several weeks ago. Inciting event: Bilateral TKA. She has had difficulty gaining knee flexion despite adequate physical therapy and presents now for blateral knee closed manpulation  Past Medical History  Diagnosis Date  . Hypothyroidism   . Arthritis   . Cancer 2010    left breast surgery and radiation ,  dec 2013 colon chemo done for colon  . DVT of leg (deep venous thrombosis) 02-2015    Left leg    Past Surgical History  Procedure Laterality Date  . Breast surgery Left 2010    lumpectomy done, lymph nodes removed   . Abdominal hysterectomy      partial  . Colon surgery  dec 2013    surgery for colon cancer, part of bladder, part of pelvic wall removed appendix removed 1 ovary removed   . Appendectomy  dec 2013  . Knee arthroscopy Bilateral   . Total knee arthroplasty Bilateral 03/07/2015    Procedure: BILATERAL TOTAL KNEE ARTHROPLASTY;  Surgeon: Gaynelle Arabian, MD;  Location: [facility];  Service: Orthopedics;  Laterality: Bilateral;  +epidural    Prior to Admission medications   Medication Sig Start Date End Date Taking? Authorizing Provider  acetaminophen (TYLENOL) 500 MG tablet Take 500 mg by mouth every 6 (six) hours as needed (Pain).   Yes Historical Provider, MD  letrozole (FEMARA) 2.5 MG tablet Take 1 tablet (2.5 mg total) by mouth daily. 03/16/15  Yes Daniel J Angiulli, PA-C  levothyroxine (SYNTHROID, LEVOTHROID) 300 MCG tablet Take 1 tablet (300 mcg total) by mouth every evening. 03/16/15  Yes Lavon Paganini Angiulli, PA-C  methocarbamol (ROBAXIN) 500 MG tablet Take 1 tablet (500 mg total) by mouth every 6 (six) hours as needed for muscle spasms. 03/16/15  Yes Daniel J Angiulli, PA-C  oxyCODONE (OXY IR/ROXICODONE) 5 MG immediate release tablet Take 1-3 tablets (5-15 mg total) by mouth every 3  (three) hours as needed for breakthrough pain. 03/16/15  Yes Daniel J Angiulli, PA-C  warfarin (COUMADIN) 5 MG tablet Continue until 04/23/2015 then begin aspirin 81 mg daily 03/16/15  Yes Daniel J Angiulli, PA-C  docusate sodium (COLACE) 100 MG capsule Take 1 capsule (100 mg total) by mouth 2 (two) times daily. Patient not taking: Reported on 03/12/2015 03/12/15   Arlee Muslim, PA-C  docusate sodium (COLACE) 100 MG capsule Take 1 capsule (100 mg total) by mouth 2 (two) times daily. Patient not taking: Reported on 04/20/2015 03/16/15   Lavon Paganini Angiulli, PA-C  methocarbamol (ROBAXIN) 500 MG tablet Take 1 tablet (500 mg total) by mouth every 6 (six) hours as needed for muscle spasms. Patient not taking: Reported on 04/20/2015 03/16/15   Lavon Paganini Angiulli, PA-C  metoCLOPramide (REGLAN) 10 MG tablet Take 1 tablet (10 mg total) by mouth 4 (four) times daily -  before meals and at bedtime. Patient not taking: Reported on 04/20/2015 03/16/15   Lavon Paganini Angiulli, PA-C  polyethylene glycol (MIRALAX / GLYCOLAX) packet Take 17 g by mouth 2 (two) times daily. Patient not taking: Reported on 04/20/2015 03/16/15   Lavon Paganini Angiulli, PA-C   BILATERAL KNEE EXAM NO effusion, collateral ligaments intact, ROM each knee 5-75 degrees  Physical Examination: General appearance - alert, well appearing, and in no distress Mental status - alert, oriented to person, place, and time Chest - clear to auscultation, no  wheezes, rales or rhonchi, symmetric air entry Heart - normal rate, regular rhythm, normal S1, S2, no murmurs, rubs, clicks or gallops Abdomen - soft, nontender, nondistended, no masses or organomegaly Neurological - alert, oriented, normal speech, no focal findings or movement disorder noted   Asessment/Plan--- Bilateral knee arthrofibrosis- - Plan bilateral knee closed manipulation. Procedure risks and potential comps discussed with patient who elects to proceed. Goals are decreased pain and increased function with a  high likelihood of achieving both

## 2015-04-23 ENCOUNTER — Ambulatory Visit (HOSPITAL_COMMUNITY): Payer: PRIVATE HEALTH INSURANCE | Admitting: Anesthesiology

## 2015-04-23 ENCOUNTER — Encounter (HOSPITAL_COMMUNITY)
Admission: RE | Disposition: A | Discharge status: Home / Self Care | Payer: Self-pay | Source: Ambulatory Visit | Attending: Orthopedic Surgery

## 2015-04-23 ENCOUNTER — Ambulatory Visit (HOSPITAL_COMMUNITY)
Admission: RE | Admit: 2015-04-23 | Discharge: 2015-04-23 | Disposition: A | Discharge status: Home / Self Care | Payer: PRIVATE HEALTH INSURANCE | Source: Ambulatory Visit | Attending: Orthopedic Surgery | Admitting: Orthopedic Surgery

## 2015-04-23 ENCOUNTER — Ambulatory Visit: Payer: 59

## 2015-04-23 ENCOUNTER — Encounter (HOSPITAL_COMMUNITY): Payer: Self-pay | Admitting: *Deleted

## 2015-04-23 DIAGNOSIS — Z96653 Presence of artificial knee joint, bilateral: Secondary | ICD-10-CM | POA: Insufficient documentation

## 2015-04-23 DIAGNOSIS — M24661 Ankylosis, right knee: Secondary | ICD-10-CM | POA: Insufficient documentation

## 2015-04-23 DIAGNOSIS — Z853 Personal history of malignant neoplasm of breast: Secondary | ICD-10-CM

## 2015-04-23 DIAGNOSIS — Z923 Personal history of irradiation: Secondary | ICD-10-CM | POA: Insufficient documentation

## 2015-04-23 DIAGNOSIS — Z79891 Long term (current) use of opiate analgesic: Secondary | ICD-10-CM | POA: Insufficient documentation

## 2015-04-23 DIAGNOSIS — Z79899 Other long term (current) drug therapy: Secondary | ICD-10-CM | POA: Diagnosis not present

## 2015-04-23 DIAGNOSIS — E039 Hypothyroidism, unspecified: Secondary | ICD-10-CM | POA: Diagnosis not present

## 2015-04-23 DIAGNOSIS — Z79811 Long term (current) use of aromatase inhibitors: Secondary | ICD-10-CM | POA: Insufficient documentation

## 2015-04-23 DIAGNOSIS — Z86718 Personal history of other venous thrombosis and embolism: Secondary | ICD-10-CM | POA: Insufficient documentation

## 2015-04-23 DIAGNOSIS — Z9221 Personal history of antineoplastic chemotherapy: Secondary | ICD-10-CM | POA: Diagnosis not present

## 2015-04-23 DIAGNOSIS — Z85038 Personal history of other malignant neoplasm of large intestine: Secondary | ICD-10-CM

## 2015-04-23 DIAGNOSIS — M1990 Unspecified osteoarthritis, unspecified site: Secondary | ICD-10-CM | POA: Insufficient documentation

## 2015-04-23 DIAGNOSIS — I739 Peripheral vascular disease, unspecified: Secondary | ICD-10-CM | POA: Insufficient documentation

## 2015-04-23 DIAGNOSIS — T8482XA Fibrosis due to internal orthopedic prosthetic devices, implants and grafts, initial encounter: Secondary | ICD-10-CM | POA: Diagnosis present

## 2015-04-23 DIAGNOSIS — M24662 Ankylosis, left knee: Secondary | ICD-10-CM | POA: Insufficient documentation

## 2015-04-23 DIAGNOSIS — Z7901 Long term (current) use of anticoagulants: Secondary | ICD-10-CM | POA: Diagnosis not present

## 2015-04-23 DIAGNOSIS — Z471 Aftercare following joint replacement surgery: Secondary | ICD-10-CM | POA: Diagnosis not present

## 2015-04-23 HISTORY — PX: KNEE CLOSED REDUCTION: SHX995

## 2015-04-23 HISTORY — DX: Acute embolism and thrombosis of unspecified deep veins of unspecified lower extremity: I82.409

## 2015-04-23 LAB — APTT: aPTT: 31 seconds (ref 24–37)

## 2015-04-23 LAB — PROTIME-INR
INR: 1.13 (ref 0.00–1.49)
Prothrombin Time: 14.7 seconds (ref 11.6–15.2)

## 2015-04-23 SURGERY — MANIPULATION, KNEE, CLOSED
Anesthesia: General | Site: Knee | Laterality: Bilateral

## 2015-04-23 MED ORDER — METHOCARBAMOL 1000 MG/10ML IJ SOLN
500.0000 mg | Freq: Once | INTRAVENOUS | Status: AC
  Administered 2015-04-23: 500 mg via INTRAVENOUS
  Filled 2015-04-23: qty 5

## 2015-04-23 MED ORDER — LACTATED RINGERS IV SOLN
INTRAVENOUS | Status: DC
  Administered 2015-04-23: 1000 mL via INTRAVENOUS

## 2015-04-23 MED ORDER — SODIUM CHLORIDE 0.9 % IV SOLN: INTRAVENOUS | Status: DC

## 2015-04-23 MED ORDER — MIDAZOLAM HCL 5 MG/5ML IJ SOLN
INTRAMUSCULAR | Status: DC | PRN
  Administered 2015-04-23: 2 mg via INTRAVENOUS

## 2015-04-23 MED ORDER — FENTANYL CITRATE (PF) 100 MCG/2ML IJ SOLN
INTRAMUSCULAR | Status: DC | PRN
  Administered 2015-04-23: 50 ug via INTRAVENOUS

## 2015-04-23 MED ORDER — CHLORHEXIDINE GLUCONATE 4 % EX LIQD: 60.0000 mL | Freq: Once | CUTANEOUS | Status: DC

## 2015-04-23 MED ORDER — MIDAZOLAM HCL 2 MG/2ML IJ SOLN
INTRAMUSCULAR | Status: AC
  Filled 2015-04-23: qty 2

## 2015-04-23 MED ORDER — HYDROMORPHONE HCL 1 MG/ML IJ SOLN
0.5000 mg | INTRAMUSCULAR | Status: DC | PRN
  Administered 2015-04-23 (×2): 0.5 mg via INTRAVENOUS

## 2015-04-23 MED ORDER — ACETAMINOPHEN 10 MG/ML IV SOLN
1000.0000 mg | Freq: Once | INTRAVENOUS | Status: AC
  Administered 2015-04-23: 1000 mg via INTRAVENOUS
  Filled 2015-04-23: qty 100

## 2015-04-23 MED ORDER — OXYCODONE HCL 5 MG PO TABS: 5.0000 mg | ORAL_TABLET | Freq: Four times a day (QID) | ORAL | Status: DC | PRN

## 2015-04-23 MED ORDER — METHOCARBAMOL 500 MG PO TABS: 500.0000 mg | ORAL_TABLET | Freq: Four times a day (QID) | ORAL | Status: DC | PRN

## 2015-04-23 MED ORDER — FENTANYL CITRATE (PF) 100 MCG/2ML IJ SOLN
INTRAMUSCULAR | Status: AC
  Filled 2015-04-23: qty 2

## 2015-04-23 MED ORDER — PROPOFOL 10 MG/ML IV BOLUS
INTRAVENOUS | Status: DC | PRN
  Administered 2015-04-23: 150 mg via INTRAVENOUS

## 2015-04-23 MED ORDER — ONDANSETRON HCL 4 MG/2ML IJ SOLN: 4.0000 mg | Freq: Once | INTRAMUSCULAR | Status: DC | PRN

## 2015-04-23 MED ORDER — PROPOFOL 10 MG/ML IV BOLUS
INTRAVENOUS | Status: AC
  Filled 2015-04-23: qty 20

## 2015-04-23 MED ORDER — DEXAMETHASONE SODIUM PHOSPHATE 10 MG/ML IJ SOLN: 10.0000 mg | Freq: Once | INTRAMUSCULAR | Status: DC

## 2015-04-23 MED ORDER — HYDROMORPHONE HCL 1 MG/ML IJ SOLN
INTRAMUSCULAR | Status: AC
  Filled 2015-04-23: qty 1

## 2015-04-23 SURGICAL SUPPLY — 9 items
BANDAGE ADH SHEER 1  50/CT (GAUZE/BANDAGES/DRESSINGS) IMPLANT
GAUZE SPONGE 4X4 12PLY STRL (GAUZE/BANDAGES/DRESSINGS) IMPLANT
GLOVE BIO SURGEON STRL SZ8 (GLOVE) IMPLANT
GLOVE BIOGEL PI IND STRL 8 (GLOVE) IMPLANT
GLOVE BIOGEL PI INDICATOR 8 (GLOVE)
NDL SAFETY ECLIPSE 18X1.5 (NEEDLE) IMPLANT
NEEDLE HYPO 18GX1.5 SHARP (NEEDLE)
SWABSTICK PVP IODINE (MISCELLANEOUS) IMPLANT
SYR CONTROL 10ML LL (SYRINGE) IMPLANT

## 2015-04-23 NOTE — Anesthesia Preprocedure Evaluation (Signed)
Anesthesia Evaluation  Patient identified by MRN, date of birth, ID band Patient awake    Reviewed: Allergy & Precautions, NPO status , Patient's Chart, lab work & pertinent test results  Airway Mallampati: II       Dental   Pulmonary    Pulmonary exam normal       Cardiovascular + Peripheral Vascular Disease Rhythm:Regular Rate:Normal     Neuro/Psych    GI/Hepatic   Endo/Other  Hypothyroidism   Renal/GU      Musculoskeletal  (+) Arthritis -,   Abdominal   Peds  Hematology  (+) anemia ,   Anesthesia Other Findings   Reproductive/Obstetrics                             Anesthesia Physical Anesthesia Plan  ASA: II  Anesthesia Plan: General   Post-op Pain Management:    Induction: Intravenous  Airway Management Planned: Mask and LMA  Additional Equipment:   Intra-op Plan:   Post-operative Plan: Extubation in OR  Informed Consent: I have reviewed the patients History and Physical, chart, labs and discussed the procedure including the risks, benefits and alternatives for the proposed anesthesia with the patient or authorized representative who has indicated his/her understanding and acceptance.     Plan Discussed with: CRNA, Anesthesiologist and Surgeon  Anesthesia Plan Comments:         Anesthesia Quick Evaluation

## 2015-04-23 NOTE — Interval H&P Note (Signed)
History and Physical Interval Note:  04/23/2015 3:04 PM  Jamie Martinez  has presented today for surgery, with the diagnosis of BILATERAL KNEE ARTHROFIBROSIS   The various methods of treatment have been discussed with the patient and family. After consideration of risks, benefits and other options for treatment, the patient has consented to  Procedure(s): BILATERAL KNEE CLOSED MANIPULATION (Bilateral) as a surgical intervention .  The patient's history has been reviewed, patient examined, no change in status, stable for surgery.  I have reviewed the patient's chart and labs.  Questions were answered to the patient's satisfaction.     Gearlean Alf

## 2015-04-23 NOTE — Transfer of Care (Signed)
Immediate Anesthesia Transfer of Care Note  Patient: Jamie Martinez  Procedure(s) Performed: Procedure(s): BILATERAL KNEE CLOSED MANIPULATION (Bilateral)  Patient Location: PACU  Anesthesia Type:General  Level of Consciousness: awake, sedated and patient cooperative  Airway & Oxygen Therapy: Patient Spontanous Breathing and Patient connected to face mask oxygen  Post-op Assessment: Report given to RN and Post -op Vital signs reviewed and stable  Post vital signs: Reviewed and stable  Last Vitals:  Filed Vitals:   04/23/15 1244  BP: 115/71  Pulse: 86  Temp: 36.8 C  Resp: 18    Complications: No apparent anesthesia complications

## 2015-04-23 NOTE — Op Note (Signed)
  OPERATIVE REPORT   PREOPERATIVE DIAGNOSIS: Arthrofibrosis, Bilateral  knees.   POSTOPERATIVE DIAGNOSIS: Arthrofibrosis, Bilateral knees.   PROCEDURE:  Bilateral  knee closed manipulation.   SURGEON: Gaynelle Arabian, MD   ASSISTANT: None.   ANESTHESIA: General.   COMPLICATIONS: None.   CONDITION: Stable to Recovery.   Pre-manipulation range of motion is 5-70 on right and 5-75 on left.  Post-manipulation range of  Motion is 0-130 on right and 0-125 on left  PROCEDURE IN DETAIL: After successful administration of general  anesthetic, exam under anesthesia was performed showing range of motion  5-75 degrees in the left knee . I then placed my chest against the proximal tibia,  flexing the knee with audible lysis of adhesions. I was easily able to  get the knee flexed to 125  degrees. I then put the knee back in extension and with some  patellar manipulation and gentle pressure got to full  Extension. I then examined the right knee and range was 5-75 degrees  I then placed my chest against the proximal tibia,  flexing the knee with audible lysis of adhesions. I was easily able to  get the knee flexed to 130  degrees. I then put the knee back in extension and with some  patellar manipulation and gentle pressure got to full extension. The patient was subsequently awakened and transported to Recovery in  stable condition.

## 2015-04-23 NOTE — Discharge Instructions (Signed)
Resume your physical therapy tomorrow

## 2015-04-23 NOTE — Anesthesia Postprocedure Evaluation (Signed)
  Anesthesia Post-op Note  Patient: Jamie Martinez  Procedure(s) Performed: Procedure(s): BILATERAL KNEE CLOSED MANIPULATION (Bilateral)  Patient Location: PACU  Anesthesia Type:General  Level of Consciousness: awake, alert , oriented and patient cooperative  Airway and Oxygen Therapy: Patient Spontanous Breathing  Post-op Pain: mild  Post-op Assessment: Post-op Vital signs reviewed, Patient's Cardiovascular Status Stable, Respiratory Function Stable, Patent Airway, No signs of Nausea or vomiting and Pain level controlled  Post-op Vital Signs: stable  Last Vitals:  Filed Vitals:   04/23/15 1621  BP: 151/73  Pulse: 81  Temp: 36.7 C  Resp: 12    Complications: No apparent anesthesia complications

## 2015-04-24 ENCOUNTER — Ambulatory Visit: Payer: PRIVATE HEALTH INSURANCE

## 2015-04-24 DIAGNOSIS — M25661 Stiffness of right knee, not elsewhere classified: Secondary | ICD-10-CM | POA: Insufficient documentation

## 2015-04-24 DIAGNOSIS — R262 Difficulty in walking, not elsewhere classified: Secondary | ICD-10-CM

## 2015-04-24 DIAGNOSIS — M25569 Pain in unspecified knee: Secondary | ICD-10-CM

## 2015-04-24 DIAGNOSIS — M25662 Stiffness of left knee, not elsewhere classified: Secondary | ICD-10-CM

## 2015-04-24 NOTE — Therapy (Signed)
[place] MAIN [facility] SERVICES [address] [place], [place], [address] Phone: [phone]   Fax:  [phone]  Physical Therapy Re-Eval  Patient Details  Name: Jamie Martinez MRN: [id number] Date of Birth: November 13, 1956 Referring Provider:  Maryland Pink, MD  Encounter Date: 04/24/2015      PT End of Session - 04/24/15 1047    Visit Number 8   Number of Visits 31   Date for PT Re-Evaluation 06/19/15   PT Start Time 0930   PT Stop Time 1020   PT Time Calculation (min) 50 min   Activity Tolerance Patient limited by pain;Patient tolerated treatment well   Behavior During Therapy [facility] for tasks assessed/performed      Past Medical History  Diagnosis Date  . Hypothyroidism   . Arthritis   . Cancer 2010    left breast surgery and radiation ,  dec 2013 colon chemo done for colon  . DVT of leg (deep venous thrombosis) 02-2015    Left leg    Past Surgical History  Procedure Laterality Date  . Breast surgery Left 2010    lumpectomy done, lymph nodes removed   . Abdominal hysterectomy      partial  . Colon surgery  dec 2013    surgery for colon cancer, part of bladder, part of pelvic wall removed appendix removed 1 ovary removed   . Appendectomy  dec 2013  . Knee arthroscopy Bilateral   . Total knee arthroplasty Bilateral 03/07/2015    Procedure: BILATERAL TOTAL KNEE ARTHROPLASTY;  Surgeon: Gaynelle Arabian, MD;  Location: [facility];  Service: Orthopedics;  Laterality: Bilateral;  +epidural    There were no vitals filed for this visit.  Visit Diagnosis:  Knee stiff, right - Plan: PT plan of care cert/re-cert  Knee stiff, left - Plan: PT plan of care cert/re-cert  Knee pain, unspecified laterality - Plan: PT plan of care cert/re-cert  Difficulty walking - Plan: PT plan of care cert/re-cert      Subjective Assessment - 04/24/15 0937    Subjective pt reports undergoing bilateral TKA 03/09/15. pt underwent PT, but was having trouble  regaining ROM. Pt underwent bilateral knee manipulation 04/23/15. pt reports she is sore, but doing ok.    Patient is accompained by: Family member   Patient Stated Goals Pt reports she wants to be able to walk normal and work in the yard.    Currently in Pain? Yes   Pain Score 6    Pain Location Knee   Pain Orientation Left;Right   Pain Descriptors / Indicators --  soreness   Pain Type Other (Comment)            [facility] PT Assessment - 04/24/15 0001    Assessment   Medical Diagnosis Bilateral TKA   Onset Date 03/09/15   Next MD Visit 05/08/15   Prior Therapy PT   Precautions   Precautions None   Restrictions   Weight Bearing Restrictions No   Balance Screen   Has the patient fallen in the past 6 months No   Has the patient had a decrease in activity level because of a fear of falling?  No   Is the patient reluctant to leave their home because of a fear of falling?  No   Home Environment   Living Enviornment Private residence   Living Arrangements Spouse/significant other   Available Help at Discharge Friend(s);Family   Type of [place] to enter  Home Layout One level   [place] - 2 wheels;Cane - quad;Cane - single point   Prior Function   Level of Independence Independent with basic ADLs;Independent with homemaking with ambulation  pt had pain prior due to severe TKA   Cognition   Overall Cognitive Status Within Functional Limits for tasks assessed   Observation/Other Assessments   Observations pt in no acute distress    Skin Integrity scars healed, no sign of infection   Lower Extremity Functional Scale  60% disability   Sensation   Light Touch Appears Intact   Functional Tests   Functional tests Sit to Stand;Other  28.8s   ROM / Strength   AROM / PROM / Strength AROM;PROM;Strength   AROM   Overall AROM  Deficits   Overall AROM Comments L knee 20-60 deg, R knee 20-60 deg   PROM   Overall PROM Comments following manual  stretching/therapy today L knee 18-88 deg, R knee 18-80 deg   Strength   Overall Strength Deficits   Overall Strength Comments Bilateral: hip flexion 4/5, knee ext 4/5, hamstrings 4/5, ankle DF 4/5, hip abd 4/5, hip adduction 3+/5   Palpation   Palpation pt is tender both anterior and posterior knee    Ambulation/Gait   Ambulation/Gait Yes   Gait velocity 0.75 m/s   Standardized Balance Assessment   Standardized Balance Assessment Timed Up and Go Test   Timed Up and Go Test   TUG Normal TUG   Normal TUG (seconds) 18.6                     [facility] Adult PT Treatment/Exercise - 04/24/15 0001    Manual Therapy   Joint Mobilization grade III-IV AP and PA tib / fib mobs bilaterally 30s bouts of 3 each, pateller mobs grade III x 1 min bilaterally   Passive ROM PROM to bilateral knees in both supine and sitting EOB with min-mod over pressure and small velocity oscillations at end range x 10 each bout                PT Education - 04/24/15 1045    Education provided Yes   Education Details Continue previous HEP including passive knee ROM and strengthening- see Origninal Eval   Person(s) Educated Patient;Spouse   Methods Explanation   Comprehension Verbalized understanding             PT Long Term Goals - 04/24/15 1053    PT LONG TERM GOAL #1   Title pt will demonstrate knee AROM 5-90degrees bilaterally demonstrating ability to ascend stairs in step-over step pattern   Time 4   Period Weeks   Status New   PT LONG TERM GOAL #2   Title pt will improve gait speed to 1.86m/s for improved home and community mobility.    Time 6   Period Weeks   Status New   PT LONG TERM GOAL #3   Title pt will demonstrate knee ROM to at least 5-120 degrees to be able to perform stairs normally using 1 Ue support.    Time 8   Period Weeks   Status New   PT LONG TERM GOAL #4   Title pt will perform 5x sit to stand in <15s demonstrating improved LE strength.    Time 8   Period  Weeks   Status New               Plan - 04/24/15 1050    Clinical  Impression Statement pt presents for PT re-eval today following manipulation preformed 04/24/15 on bilateral knees following TKA which were performed 03/09/15. pt said to have had 5-130 deg of ROM in post-op note from yesterday. PROM was improved today vs previous visits today.    Pt will benefit from skilled therapeutic intervention in order to improve on the following deficits Pain;Decreased strength;Decreased mobility;Impaired flexibility;Decreased range of motion;Abnormal gait;Decreased balance   Rehab Potential Good   PT Frequency 3x / week   PT Duration 8 weeks   PT Treatment/Interventions Electrical Stimulation;Cryotherapy;Moist Heat;Balance training;DME Instruction;Therapeutic exercise;Manual techniques;Dry needling;Passive range of motion;Scar mobilization;Stair training;ADLs/Self Care Home Management;Gait training;Neuromuscular re-education   Consulted and Agree with Plan of Care Patient;Family member/caregiver        Problem List Patient Active Problem List   Diagnosis Date Noted  . Arthrofibrosis of total knee arthroplasty 04/22/2015  . Status post total bilateral knee replacement 03/12/2015  . Acute blood loss anemia 03/12/2015  . Status post bilateral knee replacements 03/12/2015  . Postoperative anemia due to acute blood loss 03/11/2015  . OA (osteoarthritis) of knee 03/07/2015   Gorden Harms. Doil Kamara, PT, DPT [phone]   Precilla Purnell 04/24/2015, 11:06 AM  [place] MAIN [facility] SERVICES [address] [place], [place], [address] Phone: [phone]   Fax:  [phone]

## 2015-04-26 ENCOUNTER — Ambulatory Visit: Payer: PRIVATE HEALTH INSURANCE

## 2015-04-26 DIAGNOSIS — M25661 Stiffness of right knee, not elsewhere classified: Secondary | ICD-10-CM

## 2015-04-26 DIAGNOSIS — M25662 Stiffness of left knee, not elsewhere classified: Secondary | ICD-10-CM

## 2015-04-26 DIAGNOSIS — M25569 Pain in unspecified knee: Secondary | ICD-10-CM

## 2015-04-26 DIAGNOSIS — R262 Difficulty in walking, not elsewhere classified: Secondary | ICD-10-CM

## 2015-04-26 NOTE — Therapy (Signed)
[place] MAIN [facility] SERVICES [address] [place], [place], [address] Phone: [phone]   Fax:  [phone]  Physical Therapy Treatment  Patient Details  Name: Jamie Martinez MRN: [id number] Date of Birth: Apr 30, 1956 Referring Provider:  Maryland Pink, MD  Encounter Date: 04/26/2015      PT End of Session - 04/26/15 1040    Visit Number 9   Number of Visits 31   Date for PT Re-Evaluation 06/19/15   PT Start Time 0930   PT Stop Time 1030   PT Time Calculation (min) 60 min   Activity Tolerance Patient limited by pain;Patient tolerated treatment well   Behavior During Therapy [facility] for tasks assessed/performed      Past Medical History  Diagnosis Date  . Hypothyroidism   . Arthritis   . Cancer 2010    left breast surgery and radiation ,  dec 2013 colon chemo done for colon  . DVT of leg (deep venous thrombosis) 02-2015    Left leg    Past Surgical History  Procedure Laterality Date  . Breast surgery Left 2010    lumpectomy done, lymph nodes removed   . Abdominal hysterectomy      partial  . Colon surgery  dec 2013    surgery for colon cancer, part of bladder, part of pelvic wall removed appendix removed 1 ovary removed   . Appendectomy  dec 2013  . Knee arthroscopy Bilateral   . Total knee arthroplasty Bilateral 03/07/2015    Procedure: BILATERAL TOTAL KNEE ARTHROPLASTY;  Surgeon: Gaynelle Arabian, MD;  Location: [facility];  Service: Orthopedics;  Laterality: Bilateral;  +epidural  . Knee closed reduction Bilateral 04/23/2015    Procedure: BILATERAL KNEE CLOSED MANIPULATION;  Surgeon: Gaynelle Arabian, MD;  Location: [facility];  Service: Orthopedics;  Laterality: Bilateral;    There were no vitals filed for this visit.  Visit Diagnosis:  Knee stiff, right  Knee stiff, left  Knee pain, unspecified laterality  Difficulty walking      Subjective Assessment - 04/26/15 0938    Subjective pt reports she is compliant with home program  and has been stretching a lot since last visit.    Patient is accompained by: Family member   Patient Stated Goals Pt reports she wants to be able to walk normal and work in the yard.    Pain Score 5    Pain Location Knee   Pain Orientation Right;Left   Pain Descriptors / Indicators Aching                         [facility] Adult PT Treatment/Exercise - 04/26/15 0001    Exercises   Exercises Knee/Hip     Nustep with 10 s hold stretching on each leg into flexion 5 5 min total- seat setting #9 x 5 min (no charge) Extensive and relatively aggressive manual therapy as follows preformed to bilateral knees in supine: AP/PA tib/fib mobs grade 3-4 bouts of 30-60s oscillations preformed x 2 bouts of each on BLE Medial and lateral tib/femur rotation mobilization grade 3 3 bouts of 30s on each leg  Proximal and distal AP and PA tib/fib mobs grade 3-4 30s bouts of 2 each bilaterally Patellar mobilizations superior and inferior grade 3-4 20s bouts of 3 each side Knee ROM with over pressure into flexion and extension with 45-60s small oscillations at end range- 5 bouts each side In sitting: PROM into knee flexion with 45-60s oscillations at  end range  Mobilization with movement into knee flexion in sitting with anterior glide applied with flexion 2x10 each side Pt required short breaks at times to reduce pain during therapy. Min cues needed to relax during manual therapy Treatment followed by cryotherapy to bilateral knees x 10 min in supine            PT Education - 04/26/15 1039    Education Details continue HEP emphasis on stretching   Person(s) Educated Patient;Spouse   Methods Explanation             PT Long Term Goals - 04/24/15 1053    PT LONG TERM GOAL #1   Title pt will demonstrate knee AROM 5-90degrees bilaterally demonstrating ability to ascend stairs in step-over step pattern   Time 4   Period Weeks   Status New   PT LONG TERM GOAL #2   Title pt will  improve gait speed to 1.35m/s for improved home and community mobility.    Time 6   Period Weeks   Status New   PT LONG TERM GOAL #3   Title pt will demonstrate knee ROM to at least 5-120 degrees to be able to perform stairs normally using 1 Ue support.    Time 8   Period Weeks   Status New   PT LONG TERM GOAL #4   Title pt will perform 5x sit to stand in <15s demonstrating improved LE strength.    Time 8   Period Weeks   Status New               Plan - 04/26/15 1041    Clinical Impression Statement Extensive manual therapy applied to bilateral knees and lower leg today yielded some ROM improvements, however minimal considering the amount of therapy preformed. R knee ROM today 10-80 deg following treatment, 10-92 deg on the L knee following treatment. pt tolerates treatment well and seems to have a high pain threshold.    Pt will benefit from skilled therapeutic intervention in order to improve on the following deficits Pain;Decreased strength;Decreased mobility;Impaired flexibility;Decreased range of motion;Abnormal gait;Decreased balance   Rehab Potential Good   PT Frequency 3x / week   PT Duration 8 weeks   PT Treatment/Interventions Electrical Stimulation;Cryotherapy;Moist Heat;Balance training;DME Instruction;Therapeutic exercise;Manual techniques;Dry needling;Passive range of motion;Scar mobilization;Stair training;ADLs/Self Care Home Management;Gait training;Neuromuscular re-education   PT Next Visit Plan progress therex as tolerated        Problem List Patient Active Problem List   Diagnosis Date Noted  . Arthrofibrosis of total knee arthroplasty 04/22/2015  . Status post total bilateral knee replacement 03/12/2015  . Acute blood loss anemia 03/12/2015  . Status post bilateral knee replacements 03/12/2015  . Postoperative anemia due to acute blood loss 03/11/2015  . OA (osteoarthritis) of knee 03/07/2015   Gorden Harms. Chaunice Obie, PT, DPT  [phone]  Crosby Bevan 04/26/2015, 10:48 AM  [place] MAIN [facility] SERVICES [address] [place], [place], [address] Phone: [phone]   Fax:  [phone]

## 2015-04-30 ENCOUNTER — Ambulatory Visit: Payer: PRIVATE HEALTH INSURANCE

## 2015-04-30 DIAGNOSIS — M25662 Stiffness of left knee, not elsewhere classified: Secondary | ICD-10-CM

## 2015-04-30 DIAGNOSIS — M25661 Stiffness of right knee, not elsewhere classified: Secondary | ICD-10-CM | POA: Diagnosis not present

## 2015-04-30 DIAGNOSIS — R262 Difficulty in walking, not elsewhere classified: Secondary | ICD-10-CM

## 2015-04-30 DIAGNOSIS — M25569 Pain in unspecified knee: Secondary | ICD-10-CM

## 2015-04-30 NOTE — Therapy (Signed)
[place] MAIN [facility] SERVICES [address] [place], [place], [address] Phone: [phone]   Fax:  [phone]  Physical Therapy Treatment  Patient Details  Name: JOYCELIN Martinez MRN: [id number] Date of Birth: 01/27/1956 Referring Provider:  Maryland Pink, MD  Encounter Date: 04/30/2015      PT End of Session - 04/30/15 1048    Visit Number 10   Number of Visits 31   Date for PT Re-Evaluation 06/19/15   PT Start Time 0930   PT Stop Time 1030   PT Time Calculation (min) 60 min   Activity Tolerance Patient limited by pain;Patient tolerated treatment well   Behavior During Therapy [facility] for tasks assessed/performed      Past Medical History  Diagnosis Date  . Hypothyroidism   . Arthritis   . Cancer 2010    left breast surgery and radiation ,  dec 2013 colon chemo done for colon  . DVT of leg (deep venous thrombosis) 02-2015    Left leg    Past Surgical History  Procedure Laterality Date  . Breast surgery Left 2010    lumpectomy done, lymph nodes removed   . Abdominal hysterectomy      partial  . Colon surgery  dec 2013    surgery for colon cancer, part of bladder, part of pelvic wall removed appendix removed 1 ovary removed   . Appendectomy  dec 2013  . Knee arthroscopy Bilateral   . Total knee arthroplasty Bilateral 03/07/2015    Procedure: BILATERAL TOTAL KNEE ARTHROPLASTY;  Surgeon: Gaynelle Arabian, MD;  [facility]: [facility];  Service: Orthopedics;  Laterality: Bilateral;  +epidural  . Knee closed reduction Bilateral 04/23/2015    Procedure: BILATERAL KNEE CLOSED MANIPULATION;  Surgeon: Gaynelle Arabian, MD;  Location: [facility];  Service: Orthopedics;  Laterality: Bilateral;    There were no vitals filed for this visit.  Visit Diagnosis:  Knee stiff, right  Knee stiff, left  Knee pain, unspecified laterality  Difficulty walking      Subjective Assessment - 04/30/15 0936    Subjective pt reports she feels like her knees are coming  along. reports they are less sore and maybe are moving better.    Patient is accompained by: Family member   Patient Stated Goals Pt reports she wants to be able to walk normal and work in the yard.    Pain Score 4    Pain Location Knee   Pain Orientation Right;Left   Pain Descriptors / Indicators Aching                         [facility] Adult PT Treatment/Exercise - 04/30/15 0001    Exercises   Exercises --  Nustep L 1 with 10s hold for AAROM x  76min   Knee/Hip Exercises: Machines for Strengthening   Cybex Leg Press 75lbs 2x10 bilaterally, then 45lbs x 15 single leg   Knee/Hip Exercises: Standing   Forward Step Up Both;1 set;10 reps;Step Height: 6";Hand Hold: 1  cues to minimize compensatory strategies   Functional Squat 1 set;5 sets  modified sit to stand on table to promote knee flexion   Other Standing Knee Exercises TKE with ball on wall 2x10 bilaterally,    Knee/Hip Exercises: Seated   Long Arc Quad AROM;Both;2 sets;10 reps   Knee/Hip Exercises: Supine   Short Arc Quad Sets Both;2 sets;10 reps;AAROM  PT assist for improved knee ext   Short Arc Quad Sets Limitations reduced  ROM   Heel Slides Right   Modalities   Modalities Cryotherapy   Cryotherapy   Number Minutes Cryotherapy 10 Minutes   Cryotherapy Location Knee   Type of Cryotherapy Ice pack   Manual Therapy   Joint Mobilization grade III-IV AP and PA tib / fib mobs bilaterally 30s bouts of 3 each, pateller mobs grade III x 1 min bilaterally   Passive ROM PROM to bilateral knees in both supine and sitting EOB with min-mod over pressure and small velocity oscillations at end range x 10 each bout                PT Education - 04/30/15 1047    Education provided Yes   Education Details SAQ instead of quad set for HEP to maximize quad activation   Person(s) Educated Patient;Spouse   Methods Explanation   Comprehension Verbalized understanding             PT Long Term Goals - 04/30/15  1055    PT LONG TERM GOAL #1   Title pt will demonstrate knee AROM 5-90degrees bilaterally demonstrating ability to ascend stairs in step-over step pattern   Time 4   Period Weeks   Status On-going   PT LONG TERM GOAL #2   Title pt will improve gait speed to 1.35m/s for improved home and community mobility.    Time 6   Period Weeks   Status On-going   PT LONG TERM GOAL #3   Title pt will demonstrate knee ROM to at least 5-120 degrees to be able to perform stairs normally using 1 Ue support.    Time 8   Period Weeks   Status On-going   PT LONG TERM GOAL #4   Title pt will perform 5x sit to stand in <15s demonstrating improved LE strength.    Time 8   Period Weeks   Status On-going               Plan - 04/30/15 1050    Clinical Impression Statement pt still struggling to make significant ROM gains. some increased swelling/bruising likely due ot manip last week. progressed strengthening, noted poor quad activation with some exercises despite extesive cues. pt instructed to preform SAQ vs quad set at home as she had better quad activation with this. pt ROM still limited by pain.    Pt will benefit from skilled therapeutic intervention in order to improve on the following deficits Pain;Decreased strength;Decreased mobility;Impaired flexibility;Decreased range of motion;Abnormal gait;Decreased balance   PT Frequency 3x / week   PT Duration 8 weeks   PT Next Visit Plan progress therex as tolerated        Problem List Patient Active Problem List   Diagnosis Date Noted  . Arthrofibrosis of total knee arthroplasty 04/22/2015  . Status post total bilateral knee replacement 03/12/2015  . Acute blood loss anemia 03/12/2015  . Status post bilateral knee replacements 03/12/2015  . Postoperative anemia due to acute blood loss 03/11/2015  . OA (osteoarthritis) of knee 03/07/2015   Gorden Harms. [place], PT, DPT [phone]  [name] 04/30/2015, 10:57 AM  [place] MAIN [facility] SERVICES [address] [place], [place], [address] Phone: [phone]   Fax:  [phone]

## 2015-05-02 ENCOUNTER — Ambulatory Visit: Payer: PRIVATE HEALTH INSURANCE

## 2015-05-02 DIAGNOSIS — M25662 Stiffness of left knee, not elsewhere classified: Secondary | ICD-10-CM

## 2015-05-02 DIAGNOSIS — R262 Difficulty in walking, not elsewhere classified: Secondary | ICD-10-CM

## 2015-05-02 DIAGNOSIS — M25569 Pain in unspecified knee: Secondary | ICD-10-CM

## 2015-05-02 DIAGNOSIS — M25661 Stiffness of right knee, not elsewhere classified: Secondary | ICD-10-CM

## 2015-05-02 NOTE — Therapy (Signed)
[place] MAIN [facility] SERVICES [address] [place], [place], [address] Phone: [phone]   Fax:  [phone]  Physical Therapy Treatment  Patient Details  Name: Jamie Martinez MRN: [id number] Date of Birth: [birth year] Referring Provider:  Gaynelle Arabian, MD  Encounter Date: 05/02/2015      PT End of Session - 05/02/15 1106    Visit Number 11   Number of Visits 31   Date for PT Re-Evaluation 06/19/15   PT Start Time 0931   PT Stop Time 1030   PT Time Calculation (min) 59 min   Activity Tolerance Patient limited by pain;Patient tolerated treatment well   Behavior During Therapy [facility] for tasks assessed/performed      Past Medical History  Diagnosis Date  . Hypothyroidism   . Arthritis   . Cancer 2010    left breast surgery and radiation ,  dec 2013 colon chemo done for colon  . DVT of leg (deep venous thrombosis) 02-2015    Left leg    Past Surgical History  Procedure Laterality Date  . Breast surgery Left 2010    lumpectomy done, lymph nodes removed   . Abdominal hysterectomy      partial  . Colon surgery  dec 2013    surgery for colon cancer, part of bladder, part of pelvic wall removed appendix removed 1 ovary removed   . Appendectomy  dec 2013  . Knee arthroscopy Bilateral   . Total knee arthroplasty Bilateral 03/07/2015    Procedure: BILATERAL TOTAL KNEE ARTHROPLASTY;  Surgeon: Gaynelle Arabian, MD;  [facility];  Service: Orthopedics;  Laterality: Bilateral;  +epidural  . Knee closed reduction Bilateral 04/23/2015    Procedure: BILATERAL KNEE CLOSED MANIPULATION;  Surgeon: Gaynelle Arabian, MD;  [facility];  Service: Orthopedics;  Laterality: Bilateral;    There were no vitals filed for this visit.  Visit Diagnosis:  Knee stiff, right  Knee stiff, left  Knee pain, unspecified laterality  Difficulty walking      Subjective Assessment - 05/02/15 1100    Subjective pt reports she is trying to stretch a lot at  home, but doesnt feel like her ROM is improving.    Patient is accompained by: Family member   Patient Stated Goals Pt reports she wants to be able to walk normal and work in the yard.    Currently in Pain? Yes   Pain Score 5    Pain Location Knee   Pain Orientation Right;Left   Pain Descriptors / Indicators Aching      Treatment: Manual Therapy    Joint Mobilization grade III-IV AP and PA tib / fib mobs bilaterally 30s bouts of 3 each, pateller mobs grade III x 1 min bilaterally   Passive ROM PROM to bilateral knees in both supine and sitting EOB with min-mod over pressure and small velocity oscillations at end range x 10 each bout        Pt required verbal cues to relax during manual therapy: Pre manual therapy 10-70 deg ROM bilaterally, post manual therapy 10-82 deg R and 10-80 deg LLE  Therex: Nustep for knee flexion- seat level 9- 10 second holds into flexion x 4 min Standing squat on AIREX - cues for proper mechanics, posture and to promote knee flexion Fwd step downs from 6inch step 3x5 bilaterally, cues to reduce compensatory strategies preventing further knee flexion  Side step overs- 6inch step x 10 Fwd lunge onto BOSU cues to reduce compensatory strategies  preventing further knee flexion  2x10 each leg Cone step overs in // bars cues to reduce compensatory strategies preventing further knee flexion . X 5 laps                                 PT Long Term Goals - 04/30/15 1055    PT LONG TERM GOAL #1   Title pt will demonstrate knee AROM 5-90degrees bilaterally demonstrating ability to ascend stairs in step-over step pattern   Time 4   Period Weeks   Status On-going   PT LONG TERM GOAL #2   Title pt will improve gait speed to 1.57m/s for improved home and community mobility.    Time 6   Period Weeks   Status On-going   PT LONG TERM GOAL #3   Title pt will demonstrate knee ROM to at least 5-120 degrees to be able to perform stairs  normally using 1 Ue support.    Time 8   Period Weeks   Status On-going   PT LONG TERM GOAL #4   Title pt will perform 5x sit to stand in <15s demonstrating improved LE strength.    Time 8   Period Weeks   Status On-going               Plan - 05/02/15 1107    Clinical Impression Statement Jamie Martinez is still struggling to make gains regarding her ROM despite extensive manual therapy and home stretching program. Pts manual therapy is as aggressive as pt can tolerate. pt still lacks about 10 deg terminal knee extension, and is able to flex knees post manual therapy about 80-85 degrees (about 70 deg pre). pt does seem to demonstrate good motivation, but at times poor body awarness when preforming exercises needing cues to minimize compensatory patters preventing optimal knee flexion/extension. pt does have f/u with MD in 6 days.    Pt will benefit from skilled therapeutic intervention in order to improve on the following deficits Pain;Decreased strength;Decreased mobility;Impaired flexibility;Decreased range of motion;Abnormal gait;Decreased balance   Rehab Potential Good   PT Frequency 3x / week   PT Duration 8 weeks   PT Treatment/Interventions Electrical Stimulation;Cryotherapy;Moist Heat;Balance training;DME Instruction;Therapeutic exercise;Manual techniques;Dry needling;Passive range of motion;Scar mobilization;Stair training;ADLs/Self Care Home Management;Gait training;Neuromuscular re-education   PT Next Visit Plan progress therex as tolerated   Consulted and Agree with Plan of Care Patient;Family member/caregiver        Problem List Patient Active Problem List   Diagnosis Date Noted  . Arthrofibrosis of total knee arthroplasty 04/22/2015  . Status post total bilateral knee replacement 03/12/2015  . Acute blood loss anemia 03/12/2015  . Status post bilateral knee replacements 03/12/2015  . Postoperative anemia due to acute blood loss 03/11/2015  . OA (osteoarthritis) of  knee 03/07/2015   Gorden Harms. Zakaria Sedor, PT, DPT [phone]  Jamie Martinez 05/02/2015, 11:13 AM  [place] [facility] MAIN [facility] SERVICES [address] [place], [place], [address] Phone: [phone]   Fax:  [phone]

## 2015-05-02 NOTE — Patient Instructions (Signed)
Prone hangs 2-5 min as tolerated 1-3xdaily

## 2015-05-03 ENCOUNTER — Ambulatory Visit: Payer: PRIVATE HEALTH INSURANCE | Attending: Orthopedic Surgery

## 2015-05-03 DIAGNOSIS — R262 Difficulty in walking, not elsewhere classified: Secondary | ICD-10-CM

## 2015-05-03 DIAGNOSIS — M25661 Stiffness of right knee, not elsewhere classified: Secondary | ICD-10-CM

## 2015-05-03 DIAGNOSIS — M25662 Stiffness of left knee, not elsewhere classified: Secondary | ICD-10-CM

## 2015-05-03 DIAGNOSIS — M25569 Pain in unspecified knee: Secondary | ICD-10-CM

## 2015-05-03 NOTE — Therapy (Signed)
[place] MAIN [facility] SERVICES [address] [place], [place], [address] Phone: [phone]   Fax:  [phone]  Physical Therapy Treatment  Patient Details  Name: Jamie Martinez MRN: [id number] Date of Birth: [birth year] Referring Provider:  Gaynelle Arabian, MD  Encounter Date: 05/03/2015      PT End of Session - 05/03/15 1116    Visit Number 12   Number of Visits 31   Date for PT Re-Evaluation 06/19/15   PT Start Time 0945   PT Stop Time 1100   PT Time Calculation (min) 75 min   Activity Tolerance Patient limited by pain;Patient tolerated treatment well   Behavior During Therapy [facility] for tasks assessed/performed      Past Medical History  Diagnosis Date  . Hypothyroidism   . Arthritis   . Cancer 2010    left breast surgery and radiation ,  dec 2013 colon chemo done for colon  . DVT of leg (deep venous thrombosis) 02-2015    Left leg    Past Surgical History  Procedure Laterality Date  . Breast surgery Left 2010    lumpectomy done, lymph nodes removed   . Abdominal hysterectomy      partial  . Colon surgery  dec 2013    surgery for colon cancer, part of bladder, part of pelvic wall removed appendix removed 1 ovary removed   . Appendectomy  dec 2013  . Knee arthroscopy Bilateral   . Total knee arthroplasty Bilateral 03/07/2015    Procedure: BILATERAL TOTAL KNEE ARTHROPLASTY;  Surgeon: Gaynelle Arabian, MD;  [facility]: [facility];  Service: Orthopedics;  Laterality: Bilateral;  +epidural  . Knee closed reduction Bilateral 04/23/2015    Procedure: BILATERAL KNEE CLOSED MANIPULATION;  Surgeon: Gaynelle Arabian, MD;  [facility];  Service: Orthopedics;  Laterality: Bilateral;    There were no vitals filed for this visit.  Visit Diagnosis:  Knee stiff, right  Knee stiff, left  Knee pain, unspecified laterality  Difficulty walking      Subjective Assessment - 05/03/15 1113    Subjective pt reports her knees are not as sore as they  were yesterday   Patient is accompained by: Family member   Patient Stated Goals Pt reports she wants to be able to walk normal and work in the yard.    Currently in Pain? Yes   Pain Score 3    Pain Location Knee   Pain Orientation Left;Right     Therex: leg press 100lbs 3x12 cues for terminal knee ext Nustep for PROM seat level 8 x 5 min with 10s holds  Prone TKE x 15 each side pt needs verbal and tactile cues  Gait training: In hallway with and without RW, mod verbal and tactile cues for step length, toe off, heel strike and knee flexion during gait x 268f then x 1027fafter PROM into knee flexion Pt reports her knees dont feel as tight   Manual Therapy    Joint Mobilization grade III-IV AP and PA tib / fib mobs bilaterally 30s bouts of 3 each, pateller mobs grade III x 1 min bilaterally   Passive ROM PROM into flexion/extension to bilateral knees in both supine and sitting EOB with min-mod over pressure and small velocity oscillations at end range x 10 each bout              Manual Therapy continued: STM including efflurage and muscle stripping to bilateral calfs and hamstrings in prone. Prone PA tib/fib glides  grade 3-4 2 bouts of 30s Prone PROM of knees into flexion/extension. Contract / relax MET into knee flexion for quads in prone to improve knee flexion.  Contract relax for hamstrings in supine, calf stretching in supine.                              PT Education - 05/03/15 1115    Education provided Yes   Education Details Clinical biochemist) Educated Patient   Methods Explanation;Verbal cues;Tactile cues   Comprehension Returned demonstration             PT Long Term Goals - 04/30/15 1055    PT LONG TERM GOAL #1   Title pt will demonstrate knee AROM 5-90degrees bilaterally demonstrating ability to ascend stairs in step-over step pattern   Time 4   Period Weeks   Status On-going   PT LONG TERM GOAL #2    Title pt will improve gait speed to 1.40ms for improved home and community mobility.    Time 6   Period Weeks   Status On-going   PT LONG TERM GOAL #3   Title pt will demonstrate knee ROM to at least 5-120 degrees to be able to perform stairs normally using 1 Ue support.    Time 8   Period Weeks   Status On-going   PT LONG TERM GOAL #4   Title pt will perform 5x sit to stand in <15s demonstrating improved LE strength.    Time 8   Period Weeks   Status On-going               Plan - 05/03/15 1117    Clinical Impression Statement Majority of todays session included manual therapy including soft tissue techniques to imrpove ROM particularily into terminal extension today with some success. pt was having a lot of pain with maual therapy and PROM into extension in the posterior knees. PT then proceeded to perform STM and other techniques to the calf and hamstrings. knee extension improved by 8-10 degrees bilaterally. session then carried over into gait training to better nomralize gait and reduced hip circumduction, pt had moderate carry over. end ROM today was R 12-80 deg, L 10-78 deg.    Pt will benefit from skilled therapeutic intervention in order to improve on the following deficits Pain;Decreased strength;Decreased mobility;Impaired flexibility;Decreased range of motion;Abnormal gait;Decreased balance   Rehab Potential Good   PT Frequency 3x / week   PT Duration 8 weeks   PT Treatment/Interventions Electrical Stimulation;Cryotherapy;Moist Heat;Balance training;DME Instruction;Therapeutic exercise;Manual techniques;Dry needling;Passive range of motion;Scar mobilization;Stair training;ADLs/Self Care Home Management;Gait training;Neuromuscular re-education   PT Next Visit Plan progress therex as tolerated   Consulted and Agree with Plan of Care Patient;Family member/caregiver        Problem List Patient Active Problem List   Diagnosis Date Noted  . Arthrofibrosis of total knee  arthroplasty 04/22/2015  . Status post total bilateral knee replacement 03/12/2015  . Acute blood loss anemia 03/12/2015  . Status post bilateral knee replacements 03/12/2015  . Postoperative anemia due to acute blood loss 03/11/2015  . OA (osteoarthritis) of knee 03/07/2015   AGorden Harms Sharlett Lienemann, PT, DPT #[id number] Jamie Martinez 05/03/2015, 11:25 AM  [place] [facility] [place] [facility] [address] [address]: [phone]  Fax:  [phone]

## 2015-05-08 ENCOUNTER — Ambulatory Visit: Payer: PRIVATE HEALTH INSURANCE

## 2015-05-08 DIAGNOSIS — M25662 Stiffness of left knee, not elsewhere classified: Secondary | ICD-10-CM

## 2015-05-08 DIAGNOSIS — M25661 Stiffness of right knee, not elsewhere classified: Secondary | ICD-10-CM

## 2015-05-08 DIAGNOSIS — M25569 Pain in unspecified knee: Secondary | ICD-10-CM

## 2015-05-08 DIAGNOSIS — R262 Difficulty in walking, not elsewhere classified: Secondary | ICD-10-CM

## 2015-05-08 NOTE — Therapy (Signed)
[place] MAIN [facility] SERVICES [address] [place], [place], [address] Phone: [phone]   Fax:  [phone]  Physical Therapy Treatment  Patient Details  Name: Jamie Martinez MRN: [id number] Date of Birth: 31-Dec-1955 Referring Provider:  Maryland Pink, MD  Encounter Date: 05/08/2015      PT End of Session - 05/08/15 1223    Visit Number 13   Number of Visits 31   Date for PT Re-Evaluation 06/19/15   PT Start Time 0945   PT Stop Time 8811   PT Time Calculation (min) 60 min   Activity Tolerance Patient limited by pain;Patient tolerated treatment well   Behavior During Therapy Jamie Martinez - [place] for tasks assessed/performed      Past Medical History  Diagnosis Date  . Hypothyroidism   . Arthritis   . Cancer 2010    left breast surgery and radiation ,  dec 2013 colon chemo done for colon  . DVT of leg (deep venous thrombosis) 02-2015    Left leg    Past Surgical History  Procedure Laterality Date  . Breast surgery Left 2010    lumpectomy done, lymph nodes removed   . Abdominal hysterectomy      partial  . Colon surgery  dec 2013    surgery for colon cancer, part of bladder, part of pelvic wall removed appendix removed 1 ovary removed   . Appendectomy  dec 2013  . Knee arthroscopy Bilateral   . Total knee arthroplasty Bilateral 03/07/2015    Procedure: BILATERAL TOTAL KNEE ARTHROPLASTY;  Surgeon: Gaynelle Arabian, MD;  [facility];  Service: Orthopedics;  Laterality: Bilateral;  +epidural  . Knee closed reduction Bilateral 04/23/2015    Procedure: BILATERAL KNEE CLOSED MANIPULATION;  Surgeon: Gaynelle Arabian, MD;  [facility];  Service: Orthopedics;  Laterality: Bilateral;    There were no vitals filed for this visit.  Visit Diagnosis:  Knee stiff, right  Knee stiff, left  Knee pain, unspecified laterality  Difficulty walking      Subjective Assessment - 05/08/15 1221    Subjective pt reports her knees are not as sore as they  were last week   Patient is accompained by: Family member   Patient Stated Goals Pt reports she wants to be able to walk normal and work in the yard.    Currently in Pain? Yes   Pain Score 3    Pain Location Knee   Pain Orientation Right;Left   Pain Descriptors / Indicators Aching                         [facility] Adult PT Treatment/Exercise - 05/08/15 1221    Modalities   Modalities Cryotherapy   Cryotherapy   Number Minutes Cryotherapy 10 Minutes   Cryotherapy Location Knee   Type of Cryotherapy Ice pack   Manual Therapy   Joint Mobilization grade III-IV AP and PA tib / fib mobs bilaterally 30s bouts of 3 each, pateller mobs grade III x 1 min bilaterally   Passive ROM PROM to bilateral knees in both supine and sitting EOB with min-mod over pressure and small velocity oscillations at end range x 10 each bout    Therex: Standing resisted hip flexion, abduction ,adduction and extension with    yellow    Band x 15 each way bilaterally BOSU lunge x 15 each leg, cues for increased knee flexion TKE with ball on wall 5s x 20 each leg Sit to stand  from elevated surface- no UE x 6- cues for increased knee flexion             PT Education - 05/08/15 1223    Education provided Yes   Education Details continue HEP, prone hangs   Person(s) Educated Patient   Methods Explanation   Comprehension Verbalized understanding             PT Long Term Goals - 04/30/15 1055    PT LONG TERM GOAL #1   Title pt will demonstrate knee AROM 5-90degrees bilaterally demonstrating ability to ascend stairs in step-over step pattern   Time 4   Period Weeks   Status On-going   PT LONG TERM GOAL #2   Title pt will improve gait speed to 1.45m/s for improved home and community mobility.    Time 6   Period Weeks   Status On-going   PT LONG TERM GOAL #3   Title pt will demonstrate knee ROM to at least 5-120 degrees to be able to perform stairs normally using 1 Ue support.    Time  8   Period Weeks   Status On-going   PT LONG TERM GOAL #4   Title pt will perform 5x sit to stand in <15s demonstrating improved LE strength.    Time 8   Period Weeks   Status On-going               Plan - 05/08/15 1224    Clinical Impression Statement ROM today following therapy: 15-90 deg L, 15-80 deg R. pt has capular end feel of the knees, but further PROM is limited by pain. progressed HEP for hip strengthening today. pt needs cues during therex to minimize compensatory strategies and maximize ROM.   Pt will benefit from skilled therapeutic intervention in order to improve on the following deficits Pain;Decreased strength;Decreased mobility;Impaired flexibility;Decreased range of motion;Abnormal gait;Decreased balance   Rehab Potential Good   PT Frequency 3x / week   PT Duration 8 weeks   PT Treatment/Interventions Electrical Stimulation;Cryotherapy;Moist Heat;Balance training;DME Instruction;Therapeutic exercise;Manual techniques;Dry needling;Passive range of motion;Scar mobilization;Stair training;ADLs/Self Care Home Management;Gait training;Neuromuscular re-education   PT Next Visit Plan progress therex as tolerated   Consulted and Agree with Plan of Care Patient;Family member/caregiver        Problem List Patient Active Problem List   Diagnosis Date Noted  . Arthrofibrosis of total knee arthroplasty 04/22/2015  . Status post total bilateral knee replacement 03/12/2015  . Acute blood loss anemia 03/12/2015  . Status post bilateral knee replacements 03/12/2015  . Postoperative anemia due to acute blood loss 03/11/2015  . OA (osteoarthritis) of knee 03/07/2015   Gorden Harms. Miya Luviano, PT, DPT [phone]  [name] 05/08/2015, 12:27 PM  [place] MAIN [facility] SERVICES [address] [place], [place], [address] Phone: [phone]   Fax:  [phone]

## 2015-05-09 ENCOUNTER — Ambulatory Visit: Payer: PRIVATE HEALTH INSURANCE

## 2015-05-09 DIAGNOSIS — M25661 Stiffness of right knee, not elsewhere classified: Secondary | ICD-10-CM

## 2015-05-09 DIAGNOSIS — M25662 Stiffness of left knee, not elsewhere classified: Secondary | ICD-10-CM

## 2015-05-09 DIAGNOSIS — M25569 Pain in unspecified knee: Secondary | ICD-10-CM

## 2015-05-09 DIAGNOSIS — R262 Difficulty in walking, not elsewhere classified: Secondary | ICD-10-CM

## 2015-05-09 NOTE — Therapy (Signed)
[place] MAIN [facility] SERVICES [address] [place], [place], [address] Phone: [phone]   Fax:  [phone]  Physical Therapy Treatment  Patient Details  Name: Jamie Martinez MRN: [id number] Date of Birth: [birth year] Referring Provider:  Maryland Pink, MD  Encounter Date: 05/09/2015      PT End of Session - 05/09/15 1529    Visit Number 14   Number of Visits 31   Date for PT Re-Evaluation 06/19/15   PT Start Time 1000   PT Stop Time 1110   PT Time Calculation (min) 70 min   Activity Tolerance Patient limited by pain;Patient tolerated treatment well   Behavior During Therapy [facility] for tasks assessed/performed      Past Medical History  Diagnosis Date  . Hypothyroidism   . Arthritis   . Cancer 2010    left breast surgery and radiation ,  dec 2013 colon chemo done for colon  . DVT of leg (deep venous thrombosis) 02-2015    Left leg    Past Surgical History  Procedure Laterality Date  . Breast surgery Left 2010    lumpectomy done, lymph nodes removed   . Abdominal hysterectomy      partial  . Colon surgery  dec 2013    surgery for colon cancer, part of bladder, part of pelvic wall removed appendix removed 1 ovary removed   . Appendectomy  dec 2013  . Knee arthroscopy Bilateral   . Total knee arthroplasty Bilateral 03/07/2015    Procedure: BILATERAL TOTAL KNEE ARTHROPLASTY;  Surgeon: Gaynelle Arabian, MD;  Location: [facility];  Service: Orthopedics;  Laterality: Bilateral;  +epidural  . Knee closed reduction Bilateral 04/23/2015    Procedure: BILATERAL KNEE CLOSED MANIPULATION;  Surgeon: Gaynelle Arabian, MD;  Location: [facility];  Service: Orthopedics;  Laterality: Bilateral;    There were no vitals filed for this visit.  Visit Diagnosis:  Knee stiff, right  Knee stiff, left  Knee pain, unspecified laterality  Difficulty walking      Subjective Assessment - 05/09/15 1528    Subjective pt reports she doesnt understand why her  knees are so stiff when she is working so hard to improve them.    Patient is accompained by: Family member   Patient Stated Goals Pt reports she wants to be able to walk normal and work in the yard.    Currently in Pain? Yes   Pain Location Knee   Pain Orientation Left;Right   Pain Descriptors / Indicators Aching                      [facility] Adult PT Treatment/Exercise - 05/09/15 1535    Knee/Hip Exercises: Machines for Strengthening   Cybex Leg Press 100lbs 3x10 bilaterally, then 45lbs x 15 single leg   Manual Therapy   Joint Mobilization grade III-IV AP and PA tib / fib mobs bilaterally 30s bouts of 3 each, pateller mobs grade III x 1 min bilaterally   Passive ROM PROM to bilateral knees in both supine and sitting EOB with min-mod over pressure and small velocity oscillations at end range x 10 each bout      Gait training: "pawing" on treadmill- single UE at a time (CGA for safety)- 0.79mph 1 min x 2 each leg, cues for toe off promoting knee flexion - verbal and tactile cues, cues for heel strike , cues for longer step length with more hip extension Heel/toe rock- weight shifting  Same cues as  above Progressed to heel/toe rock x 3 into full swing 51m x 4 using SPC, same cues as above - tactile cues posteriorly for knee flexion Progressed to full swing through gait using SPC same cues as above- pt has better performance on LLE. Pt had fair-good carry over  Nustep L 3  LE only seat level on 9 to promote knee flexion/stretching.              PT Education - 05/09/15 1529    Education Details gait mechanics   Person(s) Educated Patient   Methods Explanation;Demonstration;Tactile cues;Verbal cues   Comprehension Returned demonstration;Verbalized understanding;Verbal cues required;Tactile cues required             PT Long Term Goals - 05/09/15 1534    PT LONG TERM GOAL #1   Title pt will demonstrate knee AROM 5-90degrees bilaterally demonstrating ability to  ascend stairs in step-over step pattern   Time 4   Period Weeks   Status On-going   PT LONG TERM GOAL #2   Title pt will improve gait speed to 1.25m/s for improved home and community mobility.    Time 6   Period Weeks   Status On-going   PT LONG TERM GOAL #3   Title pt will demonstrate knee ROM to at least 5-120 degrees to be able to perform stairs normally using 1 Ue support.    Time 8   Period Weeks   Status On-going   PT LONG TERM GOAL #4   Title pt will perform 5x sit to stand in <15s demonstrating improved LE strength.    Time 8   Period Weeks   Status On-going               Plan - 05/09/15 1530    Clinical Impression Statement ROM today following therapy 15-92 L, 15-80 R, pt has more pain with the R knee. emphasied gait pattern today do maximzie available knee flexion during gait, as she is still using very litlte knee ROM functionally during ambulation. pt requried quite a bit of verbal/tactile/visual cues, but had good carry over following training. pt also responded well to blocked/segmental learning of gait pattern with heel/toe rock and cuing. pt still having difficutly making significant progress with regard to her ROM.    Pt will benefit from skilled therapeutic intervention in order to improve on the following deficits Pain;Decreased strength;Decreased mobility;Impaired flexibility;Decreased range of motion;Abnormal gait;Decreased balance   Rehab Potential Good   PT Frequency 3x / week   PT Duration 8 weeks   PT Treatment/Interventions Electrical Stimulation;Cryotherapy;Moist Heat;Balance training;DME Instruction;Therapeutic exercise;Manual techniques;Dry needling;Passive range of motion;Scar mobilization;Stair training;ADLs/Self Care Home Management;Gait training;Neuromuscular re-education   PT Next Visit Plan progress therex as tolerated   Consulted and Agree with Plan of Care Patient;Family member/caregiver        Problem List Patient Active Problem List    Diagnosis Date Noted  . Arthrofibrosis of total knee arthroplasty 04/22/2015  . Status post total bilateral knee replacement 03/12/2015  . Acute blood loss anemia 03/12/2015  . Status post bilateral knee replacements 03/12/2015  . Postoperative anemia due to acute blood loss 03/11/2015  . OA (osteoarthritis) of knee 03/07/2015   Gorden Harms. Chrissie Dacquisto, PT, DPT [phone]  Liddie Chichester 05/09/2015, 3:37 PM  [place] MAIN [facility] SERVICES [address] [place], [place], [address] Phone: [phone]   Fax:  [phone]

## 2015-05-10 ENCOUNTER — Ambulatory Visit: Payer: PRIVATE HEALTH INSURANCE

## 2015-05-14 ENCOUNTER — Ambulatory Visit: Payer: PRIVATE HEALTH INSURANCE

## 2015-05-14 DIAGNOSIS — M25661 Stiffness of right knee, not elsewhere classified: Secondary | ICD-10-CM | POA: Diagnosis not present

## 2015-05-14 DIAGNOSIS — M25569 Pain in unspecified knee: Secondary | ICD-10-CM

## 2015-05-14 DIAGNOSIS — M25662 Stiffness of left knee, not elsewhere classified: Secondary | ICD-10-CM

## 2015-05-14 DIAGNOSIS — R262 Difficulty in walking, not elsewhere classified: Secondary | ICD-10-CM

## 2015-05-14 NOTE — Therapy (Signed)
[place] MAIN [facility] SERVICES [address] [place], [place], [address] Phone: [phone]   Fax:  [phone]  Physical Therapy Treatment  Patient Details  Name: Jamie Martinez MRN: [id number] Date of Birth: September 10, 1956 Referring Provider:  Maryland Pink, MD  Encounter Date: 05/14/2015      PT End of Session - 05/14/15 1310    Visit Number 15   Number of Visits 31   Date for PT Re-Evaluation 06/19/15   PT Start Time 1028   PT Stop Time 1125   PT Time Calculation (min) 57 min   Equipment Utilized During Treatment --  ice pack   Activity Tolerance Patient limited by pain   Behavior During Therapy [facility] for tasks assessed/performed      Past Medical History  Diagnosis Date  . Hypothyroidism   . Arthritis   . Cancer 2010    left breast surgery and radiation ,  dec 2013 colon chemo done for colon  . DVT of leg (deep venous thrombosis) 02-2015    Left leg    Past Surgical History  Procedure Laterality Date  . Breast surgery Left 2010    lumpectomy done, lymph nodes removed   . Abdominal hysterectomy      partial  . Colon surgery  dec 2013    surgery for colon cancer, part of bladder, part of pelvic wall removed appendix removed 1 ovary removed   . Appendectomy  dec 2013  . Knee arthroscopy Bilateral   . Total knee arthroplasty Bilateral 03/07/2015    Procedure: BILATERAL TOTAL KNEE ARTHROPLASTY;  Surgeon: Gaynelle Arabian, MD;  [facility];  Service: Orthopedics;  Laterality: Bilateral;  +epidural  . Knee closed reduction Bilateral 04/23/2015    Procedure: BILATERAL KNEE CLOSED MANIPULATION;  Surgeon: Gaynelle Arabian, MD;  [facility]: [facility];  Service: Orthopedics;  Laterality: Bilateral;    There were no vitals filed for this visit.  Visit Diagnosis:  Knee stiff, right - Plan: PT plan of care cert/re-cert  Knee stiff, left - Plan: PT plan of care cert/re-cert  Knee pain, unspecified laterality - Plan: PT plan of care  cert/re-cert  Difficulty walking - Plan: PT plan of care cert/re-cert      Subjective Assessment - 05/14/15 1307    Subjective pt reports she is compliant with HEP. pt reports she had follow up with surgeons PA who gave her a Rx to attend PT 5x/week. pt is unsure whether her insurace will cover this or not. pt reports she is trying to stretch and do everything she can, but doesnt feel like she is making progress.    Patient is accompained by: Family member   Patient Stated Goals Pt reports she wants to be able to walk normal and work in the yard.    Currently in Pain? Yes   Pain Score 4    Pain Location Knee   Pain Orientation Left;Right   Pain Descriptors / Indicators Sore                         [facility] Adult PT Treatment/Exercise - 05/14/15 1308    Modalities   Modalities --   Cryotherapy   Number Minutes Cryotherapy 10 Minutes   Cryotherapy Location Knee   Type of Cryotherapy Ice pack   Manual Therapy   Joint Mobilization grade III-IV AP and PA tib / fib mobs bilaterally 30s bouts of 3 each, pateller mobs grade III x 1 min bilaterally  Passive ROM PROM to bilateral knees in both supine and sitting EOB with min-mod over pressure and small velocity oscillations at end range x 10 each bout     NMES to R quadricep for quad activation 20mA, pads over VMO and rectus femoris: 10s on/ 20s off x 10 min total with PT cues to pt to preform SAQ.   L knee ROM today 15-92 deg, R knee ROM 15 - 82deg             PT Education - 05/14/15 1310    Education provided Yes   Education Details low load longer duration stretching    Person(s) Educated Patient   Methods Explanation             PT Long Term Goals - 05/09/15 1534    PT LONG TERM GOAL #1   Title pt will demonstrate knee AROM 5-90degrees bilaterally demonstrating ability to ascend stairs in step-over step pattern   Time 4   Period Weeks   Status On-going   PT LONG TERM GOAL #2   Title pt will  improve gait speed to 1.42m/s for improved home and community mobility.    Time 6   Period Weeks   Status On-going   PT LONG TERM GOAL #3   Title pt will demonstrate knee ROM to at least 5-120 degrees to be able to perform stairs normally using 1 Ue support.    Time 8   Period Weeks   Status On-going   PT LONG TERM GOAL #4   Title pt will perform 5x sit to stand in <15s demonstrating improved LE strength.    Time 8   Period Weeks   Status On-going               Plan - 05/14/15 1311    Clinical Impression Statement Pt making very minimal/gradual progress regarding her ROM, PA has recommended pt attend PT 5x/week which would likely accellerate the process, however pt is unsure whether insurance will pay for this or not. PT continues to be limited by pain during manual therapy, has capsular end feels in both flexion and extension. added NMES in order to improve quad activation on the RLE as some of her lack of extension is likely due to poor quad activation. PT will continue to focus on manual techniques and exercises geared towards maximizing ROM. PT also to contact dynamic splinting (JAS), or ERMI for low load long duration home stretching equipment and recommend this along with PT sessions.    Pt will benefit from skilled therapeutic intervention in order to improve on the following deficits Pain;Decreased strength;Decreased mobility;Impaired flexibility;Decreased range of motion;Abnormal gait;Decreased balance   Rehab Potential Good   PT Frequency 5x / week   PT Duration 4 weeks   PT Treatment/Interventions Electrical Stimulation;Cryotherapy;Moist Heat;Balance training;DME Instruction;Therapeutic exercise;Manual techniques;Dry needling;Passive range of motion;Scar mobilization;Stair training;ADLs/Self Care Home Management;Gait training;Neuromuscular re-education   PT Next Visit Plan progress therex as tolerated   Consulted and Agree with Plan of Care Patient;Family member/caregiver         Problem List Patient Active Problem List   Diagnosis Date Noted  . Arthrofibrosis of total knee arthroplasty 04/22/2015  . Status post total bilateral knee replacement 03/12/2015  . Acute blood loss anemia 03/12/2015  . Status post bilateral knee replacements 03/12/2015  . Postoperative anemia due to acute blood loss 03/11/2015  . OA (osteoarthritis) of knee 03/07/2015   Gorden Harms. Eulalia Ellerman, PT, DPT #[id number]  Chigozie Basaldua 05/14/2015, 1:18 PM  [place] MAIN [facility] SERVICES [address] [place], [place], [address] Phone: [phone]   Fax:  [phone]

## 2015-05-15 ENCOUNTER — Ambulatory Visit: Payer: 59 | Attending: Orthopedic Surgery

## 2015-05-15 DIAGNOSIS — M25561 Pain in right knee: Secondary | ICD-10-CM | POA: Insufficient documentation

## 2015-05-15 DIAGNOSIS — M25562 Pain in left knee: Secondary | ICD-10-CM | POA: Diagnosis not present

## 2015-05-15 DIAGNOSIS — R262 Difficulty in walking, not elsewhere classified: Secondary | ICD-10-CM

## 2015-05-15 DIAGNOSIS — M25661 Stiffness of right knee, not elsewhere classified: Secondary | ICD-10-CM

## 2015-05-15 DIAGNOSIS — M25662 Stiffness of left knee, not elsewhere classified: Secondary | ICD-10-CM

## 2015-05-15 DIAGNOSIS — M25569 Pain in unspecified knee: Secondary | ICD-10-CM

## 2015-05-15 NOTE — Therapy (Signed)
[place] MAIN [facility] SERVICES [address] [place], [place], [address] Phone: [phone]   Fax:  [phone]  Physical Therapy Treatment  Patient Details  Name: Jamie Martinez MRN: [id number] Date of Birth: [birth year] Referring Provider:  Maryland Pink, MD  Encounter Date: 05/15/2015      PT End of Session - 05/15/15 1631    Visit Number 16   Number of Visits 31   Date for PT Re-Evaluation 06/19/15   PT Start Time 9509   PT Stop Time 1535   PT Time Calculation (min) 50 min   Equipment Utilized During Treatment --  ice pack   Activity Tolerance Patient limited by pain   Behavior During Therapy [facility] for tasks assessed/performed      Past Medical History  Diagnosis Date  . Hypothyroidism   . Arthritis   . Cancer 2010    left breast surgery and radiation ,  dec 2013 colon chemo done for colon  . DVT of leg (deep venous thrombosis) 02-2015    Left leg    Past Surgical History  Procedure Laterality Date  . Breast surgery Left 2010    lumpectomy done, lymph nodes removed   . Abdominal hysterectomy      partial  . Colon surgery  dec 2013    surgery for colon cancer, part of bladder, part of pelvic wall removed appendix removed 1 ovary removed   . Appendectomy  dec 2013  . Knee arthroscopy Bilateral   . Total knee arthroplasty Bilateral 03/07/2015    Procedure: BILATERAL TOTAL KNEE ARTHROPLASTY;  Surgeon: Gaynelle Arabian, MD;  [facility]: [facility];  Service: Orthopedics;  Laterality: Bilateral;  +epidural  . Knee closed reduction Bilateral 04/23/2015    Procedure: BILATERAL KNEE CLOSED MANIPULATION;  Surgeon: Gaynelle Arabian, MD;  [facility]: [facility];  Service: Orthopedics;  Laterality: Bilateral;    There were no vitals filed for this visit.  Visit Diagnosis:  Knee stiff, right  Knee stiff, left  Knee pain, unspecified laterality  Difficulty walking      Subjective Assessment - 05/15/15 1630    Subjective pt reports she gets 30 PT  visits per year and she is abotu 1/2 way through them. reports she probably shouldnt do 5x/week because she will run out soon.    Patient is accompained by: Family member   Patient Stated Goals Pt reports she wants to be able to walk normal and work in the yard.    Currently in Pain? Yes   Pain Score 4    Pain Location Knee   Pain Orientation Right;Left   Pain Descriptors / Indicators Sore       Treatment: Nustep on seat level 8 x 4 min AAROM with over pressure bilaterally to knees AAROM with over-weighted knee extension and hamstring curl machine 3-5 plates with assist from PT as needed pt performed resisted exercises and passive stretching and end ranges of the machines as well This was followed by brief PROM into flexion extension ROM measurements today as follows: L knee: 12-93 deg R knee 15-86 deg  This reflects small improvement                         PT Education - 05/14/15 1310    Education provided Yes   Education Details low load longer duration stretching    Person(s) Educated Patient   Methods Explanation  PT Long Term Goals - 05/09/15 1534    PT LONG TERM GOAL #1   Title pt will demonstrate knee AROM 5-90degrees bilaterally demonstrating ability to ascend stairs in step-over step pattern   Time 4   Period Weeks   Status On-going   PT LONG TERM GOAL #2   Title pt will improve gait speed to 1.17m/s for improved home and community mobility.    Time 6   Period Weeks   Status On-going   PT LONG TERM GOAL #3   Title pt will demonstrate knee ROM to at least 5-120 degrees to be able to perform stairs normally using 1 Ue support.    Time 8   Period Weeks   Status On-going   PT LONG TERM GOAL #4   Title pt will perform 5x sit to stand in <15s demonstrating improved LE strength.    Time 8   Period Weeks   Status On-going               Plan - 05/15/15 1632    Clinical Impression Statement small gains in ROM today  following more aggressive PROM and AAROM using hamstring curl and knee extension machine over weighted. PT contacted DME representative for home dynamic splinting/ and or extensionator/flexionator to maximzie ROM.    Pt will benefit from skilled therapeutic intervention in order to improve on the following deficits Pain;Decreased strength;Decreased mobility;Impaired flexibility;Decreased range of motion;Abnormal gait;Decreased balance   Rehab Potential Fair   PT Frequency 5x / week   PT Duration 4 weeks   PT Treatment/Interventions Electrical Stimulation;Cryotherapy;Moist Heat;Balance training;DME Instruction;Therapeutic exercise;Manual techniques;Dry needling;Passive range of motion;Scar mobilization;Stair training;ADLs/Self Care Home Management;Gait training;Neuromuscular re-education   PT Next Visit Plan progress therex as tolerated   Consulted and Agree with Plan of Care Patient;Family member/caregiver        Problem List Patient Active Problem List   Diagnosis Date Noted  . Arthrofibrosis of total knee arthroplasty 04/22/2015  . Status post total bilateral knee replacement 03/12/2015  . Acute blood loss anemia 03/12/2015  . Status post bilateral knee replacements 03/12/2015  . Postoperative anemia due to acute blood loss 03/11/2015  . OA (osteoarthritis) of knee 03/07/2015   Gorden Harms. Louan Base, PT, DPT [phone]  Jamie Martinez 05/15/2015, 4:34 PM  [place] MAIN [facility] SERVICES [address] [place], [place], [address] Phone: [phone]   Fax:  [phone]

## 2015-05-15 NOTE — Patient Instructions (Signed)
Written hamstring stretch 1 min each side on step

## 2015-05-16 ENCOUNTER — Ambulatory Visit: Payer: PRIVATE HEALTH INSURANCE

## 2015-05-16 DIAGNOSIS — M25569 Pain in unspecified knee: Secondary | ICD-10-CM

## 2015-05-16 DIAGNOSIS — M25661 Stiffness of right knee, not elsewhere classified: Secondary | ICD-10-CM

## 2015-05-16 DIAGNOSIS — R262 Difficulty in walking, not elsewhere classified: Secondary | ICD-10-CM

## 2015-05-16 DIAGNOSIS — M25662 Stiffness of left knee, not elsewhere classified: Secondary | ICD-10-CM

## 2015-05-16 NOTE — Therapy (Signed)
[place] MAIN [facility] SERVICES [address] [place], [place], [address] Phone: [phone]   Fax:  [phone]   Physical Therapy Treatment  Patient Details  Name: Jamie Martinez MRN: [id number] Date of Birth: Mar 20, 1956 Referring Provider:  Maryland Pink, MD  Encounter Date: 05/16/2015      PT End of Session - 05/16/15 1310    Visit Number 17   Number of Visits 31   Date for PT Re-Evaluation 06/19/15   PT Start Time 7169   PT Stop Time 1345   PT Time Calculation (min) 40 min   Equipment Utilized During Treatment --  ice pack   Activity Tolerance Patient limited by pain   Behavior During Therapy [facility] for tasks assessed/performed      Past Medical History  Diagnosis Date  . Hypothyroidism   . Arthritis   . Cancer 2010    left breast surgery and radiation ,  dec 2013 colon chemo done for colon  . DVT of leg (deep venous thrombosis) 02-2015    Left leg    Past Surgical History  Procedure Laterality Date  . Breast surgery Left 2010    lumpectomy done, lymph nodes removed   . Abdominal hysterectomy      partial  . Colon surgery  dec 2013    surgery for colon cancer, part of bladder, part of pelvic wall removed appendix removed 1 ovary removed   . Appendectomy  dec 2013  . Knee arthroscopy Bilateral   . Total knee arthroplasty Bilateral 03/07/2015    Procedure: BILATERAL TOTAL KNEE ARTHROPLASTY;  Surgeon: Gaynelle Arabian, MD;  [facility]: [facility];  Service: Orthopedics;  Laterality: Bilateral;  +epidural  . Knee closed reduction Bilateral 04/23/2015    Procedure: BILATERAL KNEE CLOSED MANIPULATION;  Surgeon: Gaynelle Arabian, MD;  [facility]: [facility];  Service: Orthopedics;  Laterality: Bilateral;    There were no vitals filed for this visit.  Visit Diagnosis:  Knee stiff, right  Knee stiff, left  Knee pain, unspecified laterality  Difficulty walking      Subjective Assessment - 05/15/15 1630    Subjective pt reports she is  having less pain and feels a little better about her progress.    Patient is accompained by: Family member   Patient Stated Goals Pt reports she wants to be able to walk normal and work in the yard.    Currently in Pain? Yes   Pain Score 4    Pain Location Knee   Pain Orientation Right;Left   Pain Descriptors / Indicators Sore       Treatment: Nustep on seat level 8 x 4 min AAROM with over pressure bilaterally to knees AAROM and eccentric loading- with over-weighted knee extension and hamstring curl machine 5-7 plates with assist from PT as needed pt performed resisted exercises and passive stretching and end ranges of the machines as well This was followed by brief PROM into flexion extension Pt requires cues for motivation    cryotherapy to  B knees following treatment x 15 min                          PT Long Term Goals - 05/09/15 1534    PT LONG TERM GOAL #1   Title pt will demonstrate knee AROM 5-90degrees bilaterally demonstrating ability to ascend stairs in step-over step pattern   Time 4   Period Weeks   Status On-going   PT LONG TERM  GOAL #2   Title pt will improve gait speed to 1.27m/s for improved home and community mobility.    Time 6   Period Weeks   Status On-going   PT LONG TERM GOAL #3   Title pt will demonstrate knee ROM to at least 5-120 degrees to be able to perform stairs normally using 1 Ue support.    Time 8   Period Weeks   Status On-going   PT LONG TERM GOAL #4   Title pt will perform 5x sit to stand in <15s demonstrating improved LE strength.    Time 8   Period Weeks   Status On-going               Plan - 05/15/15 1632    Clinical Impression Statement small gains again in ROM today following more aggressive PROM and AAROM using hamstring curl and knee extension machine over weighted. PT contacted DME representative for home dynamic splinting/ and or extensionator/flexionator to maximzie ROM. L knee 10-92 deg today, R  knee 10-90 deg today   Pt will benefit from skilled therapeutic intervention in order to improve on the following deficits Pain;Decreased strength;Decreased mobility;Impaired flexibility;Decreased range of motion;Abnormal gait;Decreased balance   Rehab Potential Fair   PT Frequency 5x / week   PT Duration 4 weeks   PT Treatment/Interventions Electrical Stimulation;Cryotherapy;Moist Heat;Balance training;DME Instruction;Therapeutic exercise;Manual techniques;Dry needling;Passive range of motion;Scar mobilization;Stair training;ADLs/Self Care Home Management;Gait training;Neuromuscular re-education   PT Next Visit Plan progress therex as tolerated   Consulted and Agree with Plan of Care Patient;Family member/caregiver        Problem List Patient Active Problem List   Diagnosis Date Noted  . Arthrofibrosis of total knee arthroplasty 04/22/2015  . Status post total bilateral knee replacement 03/12/2015  . Acute blood loss anemia 03/12/2015  . Status post bilateral knee replacements 03/12/2015  . Postoperative anemia due to acute blood loss 03/11/2015  . OA (osteoarthritis) of knee 03/07/2015   Gorden Harms. Fenton Candee, PT, DPT [phone]  Lenox Bink 05/16/2015, 1:10 PM  [place] MAIN [facility] SERVICES [address] [place], [place], [address] Phone: [phone]   Fax:  [phone]

## 2015-05-17 ENCOUNTER — Ambulatory Visit: Payer: PRIVATE HEALTH INSURANCE

## 2015-05-17 DIAGNOSIS — M25661 Stiffness of right knee, not elsewhere classified: Secondary | ICD-10-CM

## 2015-05-17 DIAGNOSIS — M25662 Stiffness of left knee, not elsewhere classified: Secondary | ICD-10-CM

## 2015-05-17 DIAGNOSIS — R262 Difficulty in walking, not elsewhere classified: Secondary | ICD-10-CM

## 2015-05-17 DIAGNOSIS — M25569 Pain in unspecified knee: Secondary | ICD-10-CM

## 2015-05-17 NOTE — Therapy (Signed)
[place] MAIN [facility] SERVICES [address] [place], [place], [address] Phone: [phone]   Fax:  [phone]  Physical Therapy Treatment  Patient Details  Name: Jamie Martinez MRN: [id number] Date of Birth: [birth year] Referring Provider:  Maryland Pink, MD  Encounter Date: 05/17/2015      PT End of Session - 05/17/15 1012    Visit Number 18   Number of Visits 31   Date for PT Re-Evaluation 06/19/15   PT Start Time 0900   PT Stop Time 0955   PT Time Calculation (min) 55 min   Equipment Utilized During Treatment --  ice pack   Activity Tolerance Patient limited by pain   Behavior During Therapy [facility] for tasks assessed/performed      Past Medical History  Diagnosis Date  . Hypothyroidism   . Arthritis   . Cancer 2010    left breast surgery and radiation ,  dec 2013 colon chemo done for colon  . DVT of leg (deep venous thrombosis) 02-2015    Left leg    Past Surgical History  Procedure Laterality Date  . Breast surgery Left 2010    lumpectomy done, lymph nodes removed   . Abdominal hysterectomy      partial  . Colon surgery  dec 2013    surgery for colon cancer, part of bladder, part of pelvic wall removed appendix removed 1 ovary removed   . Appendectomy  dec 2013  . Knee arthroscopy Bilateral   . Total knee arthroplasty Bilateral 03/07/2015    Procedure: BILATERAL TOTAL KNEE ARTHROPLASTY;  Surgeon: Gaynelle Arabian, MD;  [facility]: [facility];  Service: Orthopedics;  Laterality: Bilateral;  +epidural  . Knee closed reduction Bilateral 04/23/2015    Procedure: BILATERAL KNEE CLOSED MANIPULATION;  Surgeon: Gaynelle Arabian, MD;  Location: [facility];  Service: Orthopedics;  Laterality: Bilateral;    There were no vitals filed for this visit.  Visit Diagnosis:  Knee stiff, right  Knee pain, unspecified laterality  Difficulty walking  Knee stiff, left      Subjective Assessment - 05/17/15 1010    Subjective pt reports she is trying  to do her best at home. she has not been doing the hamstring of calf stretches verball recommended last session.    Patient is accompained by: Family member   Patient Stated Goals Pt reports she wants to be able to walk normal and work in the yard.    Currently in Pain? Yes   Pain Score 4    Pain Location Knee   Pain Orientation Right;Anterior   Pain Descriptors / Indicators --  sore          Treatment: Leg press: 100lbs 3x15 Fwd step overs 6inch steps 1 UE- mod max cues to minimize compensatory strategies x 10 laps Side step overs 6inch steps 1 UE mod max cues to minimize compensatory strategies. X 10 laps Toe taps on 16 inch steps x 30 mod max cues to minimize compensatory strategies Lunge on step x 10 each Hamstring , calf stretching 1 min x 2 each Heel hangs on step min cues x 2 min Heel raises on leg press 100lbs x 10 Prone quad stretch with strap 1 min x 2 each leg Brief PROM of knee measurement R knee 15-80 deg, L knee 15-90deg.   Ice to bilateral knees following therex x 10 min  PT Long Term Goals - 05/09/15 1534    PT LONG TERM GOAL #1   Title pt will demonstrate knee AROM 5-90degrees bilaterally demonstrating ability to ascend stairs in step-over step pattern   Time 4   Period Weeks   Status On-going   PT LONG TERM GOAL #2   Title pt will improve gait speed to 1.56m/s for improved home and community mobility.    Time 6   Period Weeks   Status On-going   PT LONG TERM GOAL #3   Title pt will demonstrate knee ROM to at least 5-120 degrees to be able to perform stairs normally using 1 Ue support.    Time 8   Period Weeks   Status On-going   PT LONG TERM GOAL #4   Title pt will perform 5x sit to stand in <15s demonstrating improved LE strength.    Time 8   Period Weeks   Status On-going               Plan - 05/17/15 1012    Clinical Impression Statement pt has loss of ROM today versus yesterday: R knee 15-80 deg, L  knee 15-90deg. pt still needs moderate cues to minimize compensatory learned patterns that limit knee flexion to do functional activies such as stepping up with hip circumduction etc. progressed written HEP for more stretching program today as she is feeling more pain/stretch in the calfs, hamstrings and quads. pt encouraged to keep up with her stretching program to maximize gains gained in clinic treatments.   Pt will benefit from skilled therapeutic intervention in order to improve on the following deficits Pain;Decreased strength;Decreased mobility;Impaired flexibility;Decreased range of motion;Abnormal gait;Decreased balance   Rehab Potential Fair   PT Frequency 5x / week   PT Duration 4 weeks   PT Treatment/Interventions Electrical Stimulation;Cryotherapy;Moist Heat;Balance training;DME Instruction;Therapeutic exercise;Manual techniques;Dry needling;Passive range of motion;Scar mobilization;Stair training;ADLs/Self Care Home Management;Gait training;Neuromuscular re-education   PT Next Visit Plan progress therex as tolerated   Consulted and Agree with Plan of Care Patient;Family member/caregiver        Problem List Patient Active Problem List   Diagnosis Date Noted  . Arthrofibrosis of total knee arthroplasty 04/22/2015  . Status post total bilateral knee replacement 03/12/2015  . Acute blood loss anemia 03/12/2015  . Status post bilateral knee replacements 03/12/2015  . Postoperative anemia due to acute blood loss 03/11/2015  . OA (osteoarthritis) of knee 03/07/2015   Gorden Harms. Shaun Runyon, PT, DPT [phone]  Rosalva Neary 05/17/2015, 10:13 AM  [place] MAIN [facility] SERVICES [address] [place], [place], [address] Phone: [phone]   Fax:  [phone]

## 2015-05-17 NOTE — Patient Instructions (Signed)
[email or web address] Hamstring strech, towel calf stretch, prone quad stretch with belt 1 min x 2 each

## 2015-05-22 ENCOUNTER — Ambulatory Visit: Payer: PRIVATE HEALTH INSURANCE

## 2015-05-22 DIAGNOSIS — R262 Difficulty in walking, not elsewhere classified: Secondary | ICD-10-CM

## 2015-05-22 DIAGNOSIS — M25661 Stiffness of right knee, not elsewhere classified: Secondary | ICD-10-CM | POA: Diagnosis not present

## 2015-05-22 DIAGNOSIS — M25569 Pain in unspecified knee: Secondary | ICD-10-CM

## 2015-05-22 DIAGNOSIS — M25662 Stiffness of left knee, not elsewhere classified: Secondary | ICD-10-CM

## 2015-05-22 NOTE — Therapy (Signed)
[place] MAIN [facility] SERVICES [address] [place], [place], [address] Phone: [phone]   Fax:  [phone]  Physical Therapy Treatment  Patient Details  Name: Jamie Martinez MRN: [id number] Date of Birth: September 14, 1956 Referring Provider:  Maryland Pink, MD  Encounter Date: 05/22/2015      PT End of Session - 05/22/15 1300    Visit Number 19   Number of Visits 31   Date for PT Re-Evaluation 06/19/15   PT Start Time 0943   PT Stop Time 1040   PT Time Calculation (min) 57 min   Equipment Utilized During Treatment --  ice pack   Activity Tolerance Patient limited by pain   Behavior During Therapy [facility] for tasks assessed/performed      Past Medical History  Diagnosis Date  . Hypothyroidism   . Arthritis   . Cancer 2010    left breast surgery and radiation ,  dec 2013 colon chemo done for colon  . DVT of leg (deep venous thrombosis) 02-2015    Left leg    Past Surgical History  Procedure Laterality Date  . Breast surgery Left 2010    lumpectomy done, lymph nodes removed   . Abdominal hysterectomy      partial  . Colon surgery  dec 2013    surgery for colon cancer, part of bladder, part of pelvic wall removed appendix removed 1 ovary removed   . Appendectomy  dec 2013  . Knee arthroscopy Bilateral   . Total knee arthroplasty Bilateral 03/07/2015    Procedure: BILATERAL TOTAL KNEE ARTHROPLASTY;  Surgeon: Gaynelle Arabian, MD;  [facility];  Service: Orthopedics;  Laterality: Bilateral;  +epidural  . Knee closed reduction Bilateral 04/23/2015    Procedure: BILATERAL KNEE CLOSED MANIPULATION;  Surgeon: Gaynelle Arabian, MD;  [facility]: [facility];  Service: Orthopedics;  Laterality: Bilateral;    There were no vitals filed for this visit.  Visit Diagnosis:  Knee stiff, right  Knee pain, unspecified laterality  Difficulty walking  Knee stiff, left      Subjective Assessment - 05/22/15 1200    Subjective pt reports trying the new  exercises at home. pt reports- she goes back to see the MD friday. pt reports she sees a little improvement in her knees.    Patient is accompained by: Family member   Patient Stated Goals Pt reports she wants to be able to walk normal and work in the yard.    Currently in Pain? Yes   Pain Score 4    Pain Location Knee   Pain Orientation Left;Right            Treatment: Rec. Bike- rocking into knee flexion to tolerance x 4 min 10s holds Hamstring curl- over weighted to 7 plates to maximize knee extension x 10 min, 1 min holds with 20s breaks  Knee extensions over weighted 6 plates to maximize knee flexion  x 10 min, 1 min holds with 20s breaks  Followed by strengthening, knee ext 1 plate 3x10, hamstring curls 3 plates 3x10 Brief over pressure PROM in sitting to measure knee ROM: R knee 15-90deg L knee 13-100deg today  Pt needed mod cues to tolerate lower intensity, longer duration stretching.    Ice to bilateral knees following therex x 10 min                            PT Long Term Goals - 05/09/15 1534  PT LONG TERM GOAL #1   Title pt will demonstrate knee AROM 5-90degrees bilaterally demonstrating ability to ascend stairs in step-over step pattern   Time 4   Period Weeks   Status On-going   PT LONG TERM GOAL #2   Title pt will improve gait speed to 1.42m/s for improved home and community mobility.    Time 6   Period Weeks   Status On-going   PT LONG TERM GOAL #3   Title pt will demonstrate knee ROM to at least 5-120 degrees to be able to perform stairs normally using 1 Ue support.    Time 8   Period Weeks   Status On-going   PT LONG TERM GOAL #4   Title pt will perform 5x sit to stand in <15s demonstrating improved LE strength.    Time 8   Period Weeks   Status On-going               Plan - 05/22/15 1302    Clinical Impression Statement pt is making minimal gains in ROM. R knee 15-90 deg, L knee 13-100deg today after intense  stretching, over weighting on knee ext/ hamstring curl machine. PT still awaiting approval for flexionator/extensionator from pts insurance to facilitate better carry over at home following PT sessions. pt still limited by pain requriing motivational cues during therex/stretching to continue.    Pt will benefit from skilled therapeutic intervention in order to improve on the following deficits Pain;Decreased strength;Decreased mobility;Impaired flexibility;Decreased range of motion;Abnormal gait;Decreased balance   Rehab Potential Fair   PT Frequency 5x / week   PT Duration 4 weeks   PT Treatment/Interventions Electrical Stimulation;Cryotherapy;Moist Heat;Balance training;DME Instruction;Therapeutic exercise;Manual techniques;Dry needling;Passive range of motion;Scar mobilization;Stair training;ADLs/Self Care Home Management;Gait training;Neuromuscular re-education   PT Next Visit Plan progress therex as tolerated   Consulted and Agree with Plan of Care Patient;Family member/caregiver        Problem List Patient Active Problem List   Diagnosis Date Noted  . Arthrofibrosis of total knee arthroplasty 04/22/2015  . Status post total bilateral knee replacement 03/12/2015  . Acute blood loss anemia 03/12/2015  . Status post bilateral knee replacements 03/12/2015  . Postoperative anemia due to acute blood loss 03/11/2015  . OA (osteoarthritis) of knee 03/07/2015    Jamie Martinez 05/22/2015, 1:06 PM  [place] MAIN [facility] SERVICES [address] [place], [place], [address] Phone: [phone]   Fax:  [phone]

## 2015-05-23 ENCOUNTER — Ambulatory Visit: Payer: PRIVATE HEALTH INSURANCE | Attending: Family Medicine

## 2015-05-23 DIAGNOSIS — R262 Difficulty in walking, not elsewhere classified: Secondary | ICD-10-CM | POA: Insufficient documentation

## 2015-05-23 DIAGNOSIS — M25662 Stiffness of left knee, not elsewhere classified: Secondary | ICD-10-CM | POA: Insufficient documentation

## 2015-05-23 DIAGNOSIS — M25661 Stiffness of right knee, not elsewhere classified: Secondary | ICD-10-CM | POA: Diagnosis present

## 2015-05-23 DIAGNOSIS — M25569 Pain in unspecified knee: Secondary | ICD-10-CM | POA: Diagnosis not present

## 2015-05-23 NOTE — Therapy (Signed)
[place] MAIN [facility] SERVICES [address] [place], [place], [address] Phone: [phone]   Fax:  [phone]  Physical Therapy Treatment  Patient Details  Name: Jamie Martinez MRN: [id number] Date of Birth: [birth year] Referring Provider:  Donnie Coffin, MD  Encounter Date: 05/23/2015      PT End of Session - 05/23/15 1052    Visit Number 20   Number of Visits 31   Date for PT Re-Evaluation 06/19/15   PT Start Time 0944   PT Stop Time 1055   PT Time Calculation (min) 71 min   Equipment Utilized During Treatment Gait belt  ice pack   Activity Tolerance Patient limited by pain   Behavior During Therapy [facility] for tasks assessed/performed;Anxious      Past Medical History  Diagnosis Date  . Hypothyroidism   . Arthritis   . Cancer 2010    left breast surgery and radiation ,  dec 2013 colon chemo done for colon  . DVT of leg (deep venous thrombosis) 02-2015    Left leg    Past Surgical History  Procedure Laterality Date  . Breast surgery Left 2010    lumpectomy done, lymph nodes removed   . Abdominal hysterectomy      partial  . Colon surgery  dec 2013    surgery for colon cancer, part of bladder, part of pelvic wall removed appendix removed 1 ovary removed   . Appendectomy  dec 2013  . Knee arthroscopy Bilateral   . Total knee arthroplasty Bilateral 03/07/2015    Procedure: BILATERAL TOTAL KNEE ARTHROPLASTY;  Surgeon: Gaynelle Arabian, MD;  Location: [facility];  Service: Orthopedics;  Laterality: Bilateral;  +epidural  . Knee closed reduction Bilateral 04/23/2015    Procedure: BILATERAL KNEE CLOSED MANIPULATION;  Surgeon: Gaynelle Arabian, MD;  Location: [facility];  Service: Orthopedics;  Laterality: Bilateral;    There were no vitals filed for this visit.  Visit Diagnosis:  Knee pain, unspecified laterality  Knee stiff, right  Difficulty walking  Knee stiff, left      Subjective Assessment - 05/23/15 1051    Subjective pt reports  she hasnt been too sore after PT sessions despite aggressive treatment to improve ROM.    Patient is accompained by: Family member   Patient Stated Goals Pt reports she wants to be able to walk normal and work in the yard.    Pain Score 3    Pain Location Knee   Pain Orientation Right;Left                         [facility] Adult PT Treatment/Exercise - 05/23/15 1055    Manual Therapy   Joint Mobilization grade III-IV AP and PA tib / fib mobs bilaterally 30s bouts of 3 each, pateller mobs grade III x 1 min bilaterally   Passive ROM PROM to bilateral knees in both supine and sitting EOB with min-mod over pressure and small velocity oscillations at end range x 10 each bout ROM following manual therapy today: 15-90 deg R, 15-95deg L, pt needs cues to relax/not guard during PROM      Therex: Hamstring curl machine used over weighted into over pressure of pts knees into extension x 15 min, 2-3 min bouts at a time with 1 min rest in between, ROM in this position -10 deg extension bilaterally  Cable column walk out: 4 plates x 5 laps each fwd/retro and each side : CGA for  safety Stair training, beginning with 2 HHA, then 1 HHA then [facility] to ascend , 1 HHA to descend, verbal and tactile cues to minimize compensatory strategies limiting knee flexion , x 12 min Cold pack to bilateral knees in supine following manual therapy x 10 min           PT Education - 05/23/15 1052    Education provided Yes   Education Details improving stair Programmer, systems) Educated Patient   Methods Explanation   Comprehension Returned demonstration;Verbalized understanding;Verbal cues required;Tactile cues required;Need further instruction             PT Long Term Goals - 05/09/15 1534    PT LONG TERM GOAL #1   Title pt will demonstrate knee AROM 5-90degrees bilaterally demonstrating ability to ascend stairs in step-over step pattern   Time 4   Period Weeks   Status On-going   PT LONG  TERM GOAL #2   Title pt will improve gait speed to 1.16m/s for improved home and community mobility.    Time 6   Period Weeks   Status On-going   PT LONG TERM GOAL #3   Title pt will demonstrate knee ROM to at least 5-120 degrees to be able to perform stairs normally using 1 Ue support.    Time 8   Period Weeks   Status On-going   PT LONG TERM GOAL #4   Title pt will perform 5x sit to stand in <15s demonstrating improved LE strength.    Time 8   Period Weeks   Status On-going               Plan - 05/23/15 1053    Clinical Impression Statement PT progressed therex and stair training today, pt again needs a great deal of cues to minimize compensatory strategies from the hip/torso that further limits her ROM. pt is able to do stairs with step/over step pattern with cues and 1 handrail., but will need further instruction in this area likely, but was encouraged to try at home.    Pt will benefit from skilled therapeutic intervention in order to improve on the following deficits Pain;Decreased strength;Decreased mobility;Impaired flexibility;Decreased range of motion;Abnormal gait;Decreased balance   Rehab Potential Fair   PT Frequency 5x / week   PT Duration 4 weeks   PT Treatment/Interventions Electrical Stimulation;Cryotherapy;Moist Heat;Balance training;DME Instruction;Therapeutic exercise;Manual techniques;Dry needling;Passive range of motion;Scar mobilization;Stair training;ADLs/Self Care Home Management;Gait training;Neuromuscular re-education   PT Next Visit Plan progress therex as tolerated   Consulted and Agree with Plan of Care Patient;Family member/caregiver        Problem List Patient Active Problem List   Diagnosis Date Noted  . Arthrofibrosis of total knee arthroplasty 04/22/2015  . Status post total bilateral knee replacement 03/12/2015  . Acute blood loss anemia 03/12/2015  . Status post bilateral knee replacements 03/12/2015  . Postoperative anemia due to  acute blood loss 03/11/2015  . OA (osteoarthritis) of knee 03/07/2015   Gorden Harms. Berneice Zettlemoyer, PT, DPT [phone]  Shlomo Seres 05/23/2015, 10:57 AM  [place] MAIN [facility] SERVICES [address] [place], [place], [address] Phone: [phone]   Fax:  [phone]

## 2015-05-24 ENCOUNTER — Ambulatory Visit: Payer: PRIVATE HEALTH INSURANCE

## 2015-05-24 DIAGNOSIS — M25569 Pain in unspecified knee: Secondary | ICD-10-CM | POA: Diagnosis not present

## 2015-05-24 DIAGNOSIS — M25662 Stiffness of left knee, not elsewhere classified: Secondary | ICD-10-CM

## 2015-05-24 DIAGNOSIS — R262 Difficulty in walking, not elsewhere classified: Secondary | ICD-10-CM

## 2015-05-24 DIAGNOSIS — M25661 Stiffness of right knee, not elsewhere classified: Secondary | ICD-10-CM

## 2015-05-24 NOTE — Therapy (Signed)
[place] MAIN [facility] SERVICES [address] [place], [place], [address] Phone: [phone]   Fax:  [phone]  Physical Therapy Treatment  Patient Details  Name: Jamie Martinez MRN: [id number] Date of Birth: 1956/12/13 Referring Provider:  Maryland Pink, MD  Encounter Date: 05/24/2015      PT End of Session - 05/24/15 1628    Visit Number 21   Number of Visits 31   Date for PT Re-Evaluation 06/19/15   PT Start Time 0944   PT Stop Time 1030   PT Time Calculation (min) 46 min   Equipment Utilized During Treatment Gait belt  ice pack   Activity Tolerance Patient limited by pain   Behavior During Therapy [facility] for tasks assessed/performed;Anxious      Past Medical History  Diagnosis Date  . Hypothyroidism   . Arthritis   . Cancer 2010    left breast surgery and radiation ,  dec 2013 colon chemo done for colon  . DVT of leg (deep venous thrombosis) 02-2015    Left leg    Past Surgical History  Procedure Laterality Date  . Breast surgery Left 2010    lumpectomy done, lymph nodes removed   . Abdominal hysterectomy      partial  . Colon surgery  dec 2013    surgery for colon cancer, part of bladder, part of pelvic wall removed appendix removed 1 ovary removed   . Appendectomy  dec 2013  . Knee arthroscopy Bilateral   . Total knee arthroplasty Bilateral 03/07/2015    Procedure: BILATERAL TOTAL KNEE ARTHROPLASTY;  Surgeon: Gaynelle Arabian, MD;  [facility]: [facility];  Service: Orthopedics;  Laterality: Bilateral;  +epidural  . Knee closed reduction Bilateral 04/23/2015    Procedure: BILATERAL KNEE CLOSED MANIPULATION;  Surgeon: Gaynelle Arabian, MD;  [facility];  Service: Orthopedics;  Laterality: Bilateral;    There were no vitals filed for this visit.  Visit Diagnosis:  Knee pain, unspecified laterality  Knee stiff, right  Difficulty walking  Knee stiff, left      Subjective Assessment - 05/24/15 1626    Subjective pt reports  she is frustratred with her lack of progress and is nervous to go to the MD tomorrow. pt reports the equipment rep called her about the flexionator and extensionator, but hasnt called back yet. pt reports her pain is less in her knees, she does have muscle soreness from yesterdays PT session.    Patient is accompained by: Family member   Patient Stated Goals Pt reports she wants to be able to walk normal and work in the yard.    Currently in Pain? Yes   Pain Score 3    Pain Location Knee   Pain Orientation Right;Left    Treatment: Nustep  Seat level 8 x 5 min into knee flexion to pts pain tolerance for AAROM Prone hang with 3lb weights 3 min x 3 rounds, PT then applied over pressure 1 minx 3 bilaterally to pts pain tolerance MET to hamstrings in supine x 4 min total Supine PROM into knee extension bilaterally with small oscillations at end range which had a stiff / capuslar endfeel 30s x 3 each to pts pain toleratnce PROM to knee in supine into knee flexion, small oscillations at end range 30s x 4 Prone TKE with cues for quad activation 5s x 10 bilaterally Sitting SLR 2x10 each, cues for quad activation both verbal and tactile Standing TKE with cues for quad activation x 10  Followed by ice to bilateral knees                     [facility] Adult PT Treatment/Exercise - 05/23/15 1055    Manual Therapy   Joint Mobilization grade III-IV AP and PA tib / fib mobs bilaterally 30s bouts of 3 each, pateller mobs grade III x 1 min bilaterally   Passive ROM PROM to bilateral knees in both supine and sitting EOB with min-mod over pressure and small velocity oscillations at end range x 10 each bout                PT Education - 05/24/15 1628    Education provided Yes   Education Details perform SLR in sitting (vs.) supine   Person(s) Educated Patient   Methods Explanation   Comprehension Verbalized understanding;Returned demonstration             PT Long Term Goals -  05/24/15 1633    PT LONG TERM GOAL #1   Title pt will demonstrate knee AROM 5-90degrees bilaterally demonstrating ability to ascend stairs in step-over step pattern   Time 4   Period Weeks   Status Partially Met   PT LONG TERM GOAL #2   Title pt will improve gait speed to 1.80ms for improved home and community mobility.    Time 6   Period Weeks   Status Partially Met   PT LONG TERM GOAL #3   Title pt will demonstrate knee ROM to at least 5-120 degrees to be able to perform stairs normally using 1 Ue support.    Time 8   Period Weeks   Status Partially Met   PT LONG TERM GOAL #4   Title pt will perform 5x sit to stand in <15s demonstrating improved LE strength.    Time 8   Period Weeks   Status On-going               Plan - 05/24/15 1629    Clinical Impression Statement PT focused on maximizing knee extnesion today with prone hangs, PROM with over pressure, and maximizing use of quadriceps. pt had improved quad activation/recruitment during treatment with moderate cuing. pt still lacks 15deg full extension bilaterally, which can still be functional, but typically pts lack of extension worsens as she is weight baring likely due to poor motor control/awareness and quad weakness. knee ROM remains slightly imrpoved, but still having not a substanscial improvement despite more frequent therapy at 15-90deg on the L knee and 15-85deg on the R knee today. PT continues to recommend long duration/low load stretching which can be done at home using DME as pts pain in addition to stiffness is limiting her ROM progress. PT has contacted equipment rep for home use of "flexionator/extensionator:" which is still being processed by insurance.    Pt will benefit from skilled therapeutic intervention in order to improve on the following deficits Pain;Decreased strength;Decreased mobility;Impaired flexibility;Decreased range of motion;Abnormal gait;Decreased balance   Rehab Potential Fair   PT  Frequency 5x / week   PT Duration 4 weeks   PT Treatment/Interventions Electrical Stimulation;Cryotherapy;Moist Heat;Balance training;DME Instruction;Therapeutic exercise;Manual techniques;Dry needling;Passive range of motion;Scar mobilization;Stair training;ADLs/Self Care Home Management;Gait training;Neuromuscular re-education   PT Next Visit Plan progress therex as tolerated   Consulted and Agree with Plan of Care Patient;Family member/caregiver        Problem List Patient Active Problem List   Diagnosis Date Noted  . Arthrofibrosis of total knee arthroplasty 04/22/2015  .  Status post total bilateral knee replacement 03/12/2015  . Acute blood loss anemia 03/12/2015  . Status post bilateral knee replacements 03/12/2015  . Postoperative anemia due to acute blood loss 03/11/2015  . OA (osteoarthritis) of knee 03/07/2015   Gorden Harms. Azarel Banner, PT, DPT [phone]  Tunis Gentle 05/24/2015, 4:35 PM  [place] MAIN [facility] SERVICES [address] [place], [place], [address] Phone: [phone]   Fax:  [phone]

## 2015-05-28 ENCOUNTER — Ambulatory Visit: Payer: PRIVATE HEALTH INSURANCE | Attending: Physician Assistant

## 2015-05-28 DIAGNOSIS — R262 Difficulty in walking, not elsewhere classified: Secondary | ICD-10-CM

## 2015-05-28 DIAGNOSIS — Z96653 Presence of artificial knee joint, bilateral: Secondary | ICD-10-CM | POA: Insufficient documentation

## 2015-05-28 DIAGNOSIS — M25661 Stiffness of right knee, not elsewhere classified: Secondary | ICD-10-CM

## 2015-05-28 DIAGNOSIS — M25662 Stiffness of left knee, not elsewhere classified: Secondary | ICD-10-CM

## 2015-05-28 DIAGNOSIS — M25569 Pain in unspecified knee: Secondary | ICD-10-CM

## 2015-05-28 NOTE — Therapy (Signed)
[place] MAIN [facility] SERVICES [address] [place], [place], [address] Phone: [phone]   Fax:  [phone]  Physical Therapy Treatment  Patient Details  Name: Jamie Martinez MRN: [id number] Date of Birth: 1956-03-26 Referring Provider:  Gerrit Halls, PA-C  Encounter Date: 05/28/2015      PT End of Session - 05/28/15 1552    Visit Number 22   Number of Visits 31   Date for PT Re-Evaluation 06/19/15   PT Start Time 1120   PT Stop Time 1205   PT Time Calculation (min) 45 min   Equipment Utilized During Treatment --  ice pack   Activity Tolerance Patient limited by pain   Behavior During Therapy [name] for tasks assessed/performed;Anxious      Past Medical History  Diagnosis Date  . Hypothyroidism   . Arthritis   . Cancer 2010    left breast surgery and radiation ,  dec 2013 colon chemo done for colon  . DVT of leg (deep venous thrombosis) 02-2015    Left leg    Past Surgical History  Procedure Laterality Date  . Breast surgery Left 2010    lumpectomy done, lymph nodes removed   . Abdominal hysterectomy      partial  . Colon surgery  dec 2013    surgery for colon cancer, part of bladder, part of pelvic wall removed appendix removed 1 ovary removed   . Appendectomy  dec 2013  . Knee arthroscopy Bilateral   . Total knee arthroplasty Bilateral 03/07/2015    Procedure: BILATERAL TOTAL KNEE ARTHROPLASTY;  Surgeon: Gaynelle Arabian, MD;  [facility]: [facility];  Service: Orthopedics;  Laterality: Bilateral;  +epidural  . Knee closed reduction Bilateral 04/23/2015    Procedure: BILATERAL KNEE CLOSED MANIPULATION;  Surgeon: Gaynelle Arabian, MD;  [facility];  Service: Orthopedics;  Laterality: Bilateral;    There were no vitals filed for this visit.  Visit Diagnosis:  Knee pain, unspecified laterality  Knee stiff, right  Difficulty walking  Knee stiff, left      Subjective Assessment - 05/28/15 1548    Subjective pt reports she  had a good follow up visit with her surgeon. she reports he is happy with her progress, this makes her feel less anxious.    Patient is accompained by: Family member   Patient Stated Goals Pt reports she wants to be able to walk normal and work in the yard.    Currently in Pain? Yes   Pain Score 5    Pain Location Knee   Pain Orientation Right;Left                         [facility] Adult PT Treatment/Exercise - 05/28/15 1550    Cryotherapy   Number Minutes Cryotherapy 10 Minutes   Cryotherapy Location Knee   Type of Cryotherapy Ice pack   Manual Therapy   Joint Mobilization grade III-IV AP and PA tib / fib mobs bilaterally 30s bouts of 3 each, pateller mobs grade III x 1 min bilaterally   Passive ROM PROM to bilateral knees in both supine and sitting EOB with min-mod over pressure and small velocity oscillations at end range x 10 each bout  PROM today: L 10-100deg, R PROM 13-90      Therex: Nustep AAROM seat level 7: 5-10s holds for improved knee flexion x 5 min Leg press 7 plates 3x10 Hamstring curl machine- over weighted to promote knee extension utilizing  low load /long duration stretching 1 min holds x 8, over pressure applied by PT intermittently. Pt needed motivational cues at times.  Fwd step ups- 1 hand rail, 6inch step cues for terminal knee extension x 10 each leg Side eccentric step downs - 6inches cues to minimize hip drop x 10 each side TKEs : green band x 15 each side, cues for proper quad activation   treatment followed by cryotherapy to bilateral knees in supine.x 10 min              PT Long Term Goals - 05/24/15 1633    PT LONG TERM GOAL #1   Title pt will demonstrate knee AROM 5-90degrees bilaterally demonstrating ability to ascend stairs in step-over step pattern   Time 4   Period Weeks   Status Partially Met   PT LONG TERM GOAL #2   Title pt will improve gait speed to 1.60ms for improved home and community mobility.    Time 6   Period  Weeks   Status Partially Met   PT LONG TERM GOAL #3   Title pt will demonstrate knee ROM to at least 5-120 degrees to be able to perform stairs normally using 1 Ue support.    Time 8   Period Weeks   Status Partially Met   PT LONG TERM GOAL #4   Title pt will perform 5x sit to stand in <15s demonstrating improved LE strength.    Time 8   Period Weeks   Status On-going               Plan - 05/28/15 1553    Clinical Impression Statement pt had good gain in ROM regarding the L knee today. today focused on quad control and terminal knee extesion during functional activities. R knee ROM today remained about the same as last visit. pt needs mod cues during treatment to minimize compensatory patterns including excess hip flexion, hip drop, and trunk flexion to compensate for lack  of knee ROM.    Pt will benefit from skilled therapeutic intervention in order to improve on the following deficits Pain;Decreased strength;Decreased mobility;Impaired flexibility;Decreased range of motion;Abnormal gait;Decreased balance   Rehab Potential Fair   PT Frequency 5x / week   PT Duration 4 weeks   PT Treatment/Interventions Electrical Stimulation;Cryotherapy;Moist Heat;Balance training;DME Instruction;Therapeutic exercise;Manual techniques;Dry needling;Passive range of motion;Scar mobilization;Stair training;ADLs/Self Care Home Management;Gait training;Neuromuscular re-education   PT Next Visit Plan progress therex as tolerated   Consulted and Agree with Plan of Care Patient;Family member/caregiver        Problem List Patient Active Problem List   Diagnosis Date Noted  . Arthrofibrosis of total knee arthroplasty 04/22/2015  . Status post total bilateral knee replacement 03/12/2015  . Acute blood loss anemia 03/12/2015  . Status post bilateral knee replacements 03/12/2015  . Postoperative anemia due to acute blood loss 03/11/2015  . OA (osteoarthritis) of knee 03/07/2015   AGorden Harms  Ciro Tashiro, PT, DPT #[id number] Zylon Creamer 05/28/2015, 3:58 PM  CSeven [place]MAIN [facility]ri[facility] CenterSERVICES [address] [place] [address]Phone: [phone]  Fax:  [phone]

## 2015-05-30 ENCOUNTER — Ambulatory Visit: Payer: PRIVATE HEALTH INSURANCE

## 2015-05-30 DIAGNOSIS — M25569 Pain in unspecified knee: Secondary | ICD-10-CM | POA: Diagnosis not present

## 2015-05-30 DIAGNOSIS — M25662 Stiffness of left knee, not elsewhere classified: Secondary | ICD-10-CM

## 2015-05-30 DIAGNOSIS — R262 Difficulty in walking, not elsewhere classified: Secondary | ICD-10-CM

## 2015-05-30 DIAGNOSIS — M25661 Stiffness of right knee, not elsewhere classified: Secondary | ICD-10-CM

## 2015-05-30 NOTE — Therapy (Signed)
[place] MAIN [facility] SERVICES [address] [place], [place], [address] Phone: [phone]   Fax:  [phone]  Physical Therapy Treatment  Patient Details  Name: Jamie Martinez MRN: [id number] Date of Birth: 1956-06-11 Referring Provider:  Gerrit Halls, PA-C  Encounter Date: 05/30/2015      PT End of Session - 05/30/15 1113    Visit Number 23   Number of Visits 31   Date for PT Re-Evaluation 06/19/15   PT Start Time 1000   PT Stop Time 1100   PT Time Calculation (min) 60 min   Equipment Utilized During Treatment --  ice pack   Activity Tolerance Patient limited by pain   Behavior During Therapy [facility] for tasks assessed/performed      Past Medical History  Diagnosis Date  . Hypothyroidism   . Arthritis   . Cancer 2010    left breast surgery and radiation ,  dec 2013 colon chemo done for colon  . DVT of leg (deep venous thrombosis) 02-2015    Left leg    Past Surgical History  Procedure Laterality Date  . Breast surgery Left 2010    lumpectomy done, lymph nodes removed   . Abdominal hysterectomy      partial  . Colon surgery  dec 2013    surgery for colon cancer, part of bladder, part of pelvic wall removed appendix removed 1 ovary removed   . Appendectomy  dec 2013  . Knee arthroscopy Bilateral   . Total knee arthroplasty Bilateral 03/07/2015    Procedure: BILATERAL TOTAL KNEE ARTHROPLASTY;  Surgeon: Gaynelle Arabian, MD;  [facility];  Service: Orthopedics;  Laterality: Bilateral;  +epidural  . Knee closed reduction Bilateral 04/23/2015    Procedure: BILATERAL KNEE CLOSED MANIPULATION;  Surgeon: Gaynelle Arabian, MD;  [facility];  Service: Orthopedics;  Laterality: Bilateral;    There were no vitals filed for this visit.  Visit Diagnosis:  Knee pain, unspecified laterality  Knee stiff, right  Difficulty walking  Knee stiff, left      Subjective Assessment - 05/30/15 1110    Subjective pt reports she is going  to call the equipment rep back- pt reports her knees are sore as usual   Patient is accompained by: Family member   Patient Stated Goals Pt reports she wants to be able to walk normal and work in the yard.    Currently in Pain? Yes   Pain Score 4    Pain Location Knee   Pain Orientation Right;Left                         [facility] Adult PT Treatment/Exercise - 05/30/15 1111    Cryotherapy   Number Minutes Cryotherapy 10 Minutes   Cryotherapy Location Knee   Type of Cryotherapy Ice pack   Manual Therapy   Joint Mobilization grade III-IV AP and PA tib / fib mobs bilaterally 30s bouts of 3 each, pateller mobs grade III x 1 min bilaterally   Passive ROM PROM to bilateral knees in both supine min-mod over pressure and small velocity oscillations at end range x 10 each bout  PROM today: L 15-95deg, R PROM 13-90     Therex: recombant bike x 5 min- rocking and with full revolutions (hip hike noted) x 5 min- cues for motivation for improving ROM Hamstring curls 3 plates 3x10 with static stretching into extension between sets Knee ext 2plates 3x10  Leg press- single  leg 60lbs 2x10 each leg- cues for full knee ext and quad activation Sit to stand from chair- no UE- mod cues for sequence and to promote knee flexion x 5 Fwd step up - 6inches 1 UE , cues for upright posture Resisted hip flexion/abd/extension- yellow band x 10 each way single UE - cues for upright posture           PT Education - 05/30/15 1112    Education provided Yes   Education Details sit to stand without UE   Person(s) Educated Patient   Methods Explanation;Tactile cues;Verbal cues   Comprehension Verbalized understanding;Returned demonstration             PT Long Term Goals - 05/24/15 1633    PT LONG TERM GOAL #1   Title pt will demonstrate knee AROM 5-90degrees bilaterally demonstrating ability to ascend stairs in step-over step pattern   Time 4   Period Weeks   Status Partially Met   PT  LONG TERM GOAL #2   Title pt will improve gait speed to 1.52ms for improved home and community mobility.    Time 6   Period Weeks   Status Partially Met   PT LONG TERM GOAL #3   Title pt will demonstrate knee ROM to at least 5-120 degrees to be able to perform stairs normally using 1 Ue support.    Time 8   Period Weeks   Status Partially Met   PT LONG TERM GOAL #4   Title pt will perform 5x sit to stand in <15s demonstrating improved LE strength.    Time 8   Period Weeks   Status On-going               Plan - 05/30/15 1114    Clinical Impression Statement progressed hip strengthening today to help reduce hip drop, compensation and generally improve strength and stability. pt reported fatigue with this. Also, again emphasized reducing compensatory patterns, for example, pt performed sit to stand without UE  x 5, however on her own still extends the knees and stands with abnomral weight shift and high reliance on UE push off. pt urged today to call back equipment rep so she can use the extensionator/flexionator device at home to furhter improve ROM, hopefully with less pain using low load / long duration stretching. pt shows poor carry over of ROM gains achieved in clinic  showing further need for home stretching devices.    Pt will benefit from skilled therapeutic intervention in order to improve on the following deficits Pain;Decreased strength;Decreased mobility;Impaired flexibility;Decreased range of motion;Abnormal gait;Decreased balance   Rehab Potential Fair   PT Frequency 2x / week   PT Duration 4 weeks   PT Treatment/Interventions Electrical Stimulation;Cryotherapy;Moist Heat;Balance training;DME Instruction;Therapeutic exercise;Manual techniques;Dry needling;Passive range of motion;Scar mobilization;Stair training;ADLs/Self Care Home Management;Gait training;Neuromuscular re-education   PT Next Visit Plan 6 min walk test   Consulted and Agree with Plan of Care Patient;Family  member/caregiver        Problem List Patient Active Problem List   Diagnosis Date Noted  . Arthrofibrosis of total knee arthroplasty 04/22/2015  . Status post total bilateral knee replacement 03/12/2015  . Acute blood loss anemia 03/12/2015  . Status post bilateral knee replacements 03/12/2015  . Postoperative anemia due to acute blood loss 03/11/2015  . OA (osteoarthritis) of knee 03/07/2015   AGorden Harms Davanee Klinkner, PT, DPT #[id number] Tzvi Economou 05/30/2015, 11:25 AM  CSan AntonioMAIN [facility]enter[facility] HospitalSERVICES [address]  [place], [place], [address] Phone: [phone]   Fax:  [phone]

## 2015-06-04 ENCOUNTER — Ambulatory Visit: Payer: PRIVATE HEALTH INSURANCE

## 2015-06-04 DIAGNOSIS — M25569 Pain in unspecified knee: Secondary | ICD-10-CM

## 2015-06-04 DIAGNOSIS — R262 Difficulty in walking, not elsewhere classified: Secondary | ICD-10-CM

## 2015-06-04 DIAGNOSIS — M25662 Stiffness of left knee, not elsewhere classified: Secondary | ICD-10-CM

## 2015-06-04 DIAGNOSIS — M25661 Stiffness of right knee, not elsewhere classified: Secondary | ICD-10-CM

## 2015-06-04 NOTE — Therapy (Signed)
[place] MAIN [facility] SERVICES [address] [place], [place], [address] Phone: [phone]   Fax:  [phone]  Physical Therapy Treatment  Patient Details  Name: Jamie Martinez MRN: [id number] Date of Birth: 1956/10/24 Referring Provider:  Maryland Pink, MD  Encounter Date: 06/04/2015      PT End of Session - 06/04/15 1244    Visit Number 24   Number of Visits 31   Date for PT Re-Evaluation 06/19/15   PT Start Time 1005   PT Stop Time 1055   PT Time Calculation (min) 50 min   Equipment Utilized During Treatment --  ice pack   Activity Tolerance Patient limited by pain   Behavior During Therapy [facility] for tasks assessed/performed      Past Medical History  Diagnosis Date  . Hypothyroidism   . Arthritis   . Cancer 2010    left breast surgery and radiation ,  dec 2013 colon chemo done for colon  . DVT of leg (deep venous thrombosis) 02-2015    Left leg    Past Surgical History  Procedure Laterality Date  . Breast surgery Left 2010    lumpectomy done, lymph nodes removed   . Abdominal hysterectomy      partial  . Colon surgery  dec 2013    surgery for colon cancer, part of bladder, part of pelvic wall removed appendix removed 1 ovary removed   . Appendectomy  dec 2013  . Knee arthroscopy Bilateral   . Total knee arthroplasty Bilateral 03/07/2015    Procedure: BILATERAL TOTAL KNEE ARTHROPLASTY;  Surgeon: Gaynelle Arabian, MD;  [facility];  Service: Orthopedics;  Laterality: Bilateral;  +epidural  . Knee closed reduction Bilateral 04/23/2015    Procedure: BILATERAL KNEE CLOSED MANIPULATION;  Surgeon: Gaynelle Arabian, MD;  [facility];  Service: Orthopedics;  Laterality: Bilateral;    There were no vitals filed for this visit.  Visit Diagnosis:  Knee pain, unspecified laterality  Knee stiff, right  Difficulty walking  Knee stiff, left      Subjective Assessment - 06/04/15 1243    Subjective pt reports some days her  knees feel good, sometimes they dont!   Patient is accompained by: Family member   Patient Stated Goals Pt reports she wants to be able to walk normal and work in the yard.    Currently in Pain? Yes   Pain Score 3    Pain Location --  BLE knees   Pain Descriptors / Indicators Aching      Treatment:Nustep seat level 8 for ROM x 77mn Leg press 7 plates 3x10 Knee ext 2 plates 3x10, hamstring curls 4 plates 3x10  Hamstring stretch 30s x 2 Side steps: yellow band 10 ft x 6 Monster walk yellow band 137fx 6 Fwd step up- 4 inches no Ue x 10 each leg Side step up 4inches no Ue x 10 each leg Min-mod cues for proper exercise performance   Manual therapy no charge Brief PROM into flexion/ extension with over pressure  ROM today 15-90 deg bilaterally after stretching/manual therapy                               PT Long Term Goals - 05/24/15 1633    PT LONG TERM GOAL #1   Title pt will demonstrate knee AROM 5-90degrees bilaterally demonstrating ability to ascend stairs in step-over step pattern   Time 4  Period Weeks   Status Partially Met   PT LONG TERM GOAL #2   Title pt will improve gait speed to 1.56ms for improved home and community mobility.    Time 6   Period Weeks   Status Partially Met   PT LONG TERM GOAL #3   Title pt will demonstrate knee ROM to at least 5-120 degrees to be able to perform stairs normally using 1 Ue support.    Time 8   Period Weeks   Status Partially Met   PT LONG TERM GOAL #4   Title pt will perform 5x sit to stand in <15s demonstrating improved LE strength.    Time 8   Period Weeks   Status On-going               Plan - 06/04/15 1244    Clinical Impression Statement PT focusing on LE strengthening and improved functional performance rather than ROM at this time, as pt has little carryover of ROM improvements session to session and does not tolerate PROM well enough to make substantial gains. pt is getting home  devices utilizing long duration low load stretching to improve ROM on her own to her tolerance. pt did fair with progression of strengthening, but reports it was challenging.    Pt will benefit from skilled therapeutic intervention in order to improve on the following deficits Pain;Decreased strength;Decreased mobility;Impaired flexibility;Decreased range of motion;Abnormal gait;Decreased balance   Rehab Potential Fair   PT Frequency 2x / week   PT Duration 4 weeks   PT Treatment/Interventions Electrical Stimulation;Cryotherapy;Moist Heat;Balance training;DME Instruction;Therapeutic exercise;Manual techniques;Dry needling;Passive range of motion;Scar mobilization;Stair training;ADLs/Self Care Home Management;Gait training;Neuromuscular re-education   PT Next Visit Plan 6 min walk test   Consulted and Agree with Plan of Care Patient;Family member/caregiver        Problem List Patient Active Problem List   Diagnosis Date Noted  . Arthrofibrosis of total knee arthroplasty 04/22/2015  . Status post total bilateral knee replacement 03/12/2015  . Acute blood loss anemia 03/12/2015  . Status post bilateral knee replacements 03/12/2015  . Postoperative anemia due to acute blood loss 03/11/2015  . OA (osteoarthritis) of knee 03/07/2015   AGorden Harms [place], PT, DPT #[id number] Selig Wampole 06/04/2015, 12:47 PM  CPat[place]MAIN RSitka [facility] HospitalSERVICES [address] [place] [address]Phone: [phone]  Fax:  [phone]

## 2015-06-06 ENCOUNTER — Ambulatory Visit: Payer: PRIVATE HEALTH INSURANCE

## 2015-06-06 DIAGNOSIS — M25662 Stiffness of left knee, not elsewhere classified: Secondary | ICD-10-CM

## 2015-06-06 DIAGNOSIS — M25661 Stiffness of right knee, not elsewhere classified: Secondary | ICD-10-CM

## 2015-06-06 DIAGNOSIS — M25569 Pain in unspecified knee: Secondary | ICD-10-CM

## 2015-06-06 DIAGNOSIS — R262 Difficulty in walking, not elsewhere classified: Secondary | ICD-10-CM

## 2015-06-06 NOTE — Therapy (Signed)
[place] MAIN [facility] SERVICES [address] [place], [place], [address] Phone: [phone]   Fax:  [phone]  Physical Therapy Treatment  Patient Details  Name: Jamie Martinez MRN: [id number] Date of Birth: October 22, 1956 Referring Provider:  Maryland Pink, MD  Encounter Date: 06/06/2015      PT End of Session - 06/06/15 1346    Visit Number 25   Number of Visits 31   Date for PT Re-Evaluation 06/19/15   PT Start Time 1005   PT Stop Time 1055   PT Time Calculation (min) 50 min   Equipment Utilized During Treatment --  ice pack   Activity Tolerance Patient limited by pain   Behavior During Therapy [facility] for tasks assessed/performed      Past Medical History  Diagnosis Date  . Hypothyroidism   . Arthritis   . Cancer 2010    left breast surgery and radiation ,  dec 2013 colon chemo done for colon  . DVT of leg (deep venous thrombosis) 02-2015    Left leg    Past Surgical History  Procedure Laterality Date  . Breast surgery Left 2010    lumpectomy done, lymph nodes removed   . Abdominal hysterectomy      partial  . Colon surgery  dec 2013    surgery for colon cancer, part of bladder, part of pelvic wall removed appendix removed 1 ovary removed   . Appendectomy  dec 2013  . Knee arthroscopy Bilateral   . Total knee arthroplasty Bilateral 03/07/2015    Procedure: BILATERAL TOTAL KNEE ARTHROPLASTY;  Surgeon: Jamie Arabian, MD;  [facility];  Service: Orthopedics;  Laterality: Bilateral;  +epidural  . Knee closed reduction Bilateral 04/23/2015    Procedure: BILATERAL KNEE CLOSED MANIPULATION;  Surgeon: Jamie Arabian, MD;  [facility];  Service: Orthopedics;  Laterality: Bilateral;    There were no vitals filed for this visit.  Visit Diagnosis:  Knee pain, unspecified laterality  Knee stiff, right  Difficulty walking  Knee stiff, left      Subjective Assessment - 06/06/15 1333    Subjective pt reports she feels  stronger. pt is in contact with DME personel regarding the flexionator/extensionator, she reports her insurace will pay for it so she should be getting the devices soon.    Patient is accompained by: Family member   Patient Stated Goals Pt reports she wants to be able to walk normal and work in the yard.    Currently in Pain? Yes   Pain Score 3    Pain Location Knee   Pain Orientation Right;Left      Therex:  Leg press 7 plates 3x10 Hamstring curls 4 plates 3x10 Knee extension 1 plate 3x10 Cable column walk outs 4 plates x 5 laps fwd/retro/B sides Side squat and step over 6inch step 2x10 Fwd lunge 2x10 Pt needs mod cues for proper exercise performance and to minimize compensatory patterns.                            PT Education - 06/06/15 1343    Education provided Yes   Education Details normalizing gait with heel/toe walking, minimizing compensatory patterns.              PT Long Term Goals - 06/06/15 1349    PT LONG TERM GOAL #1   Title pt will demonstrate knee AROM 5-90degrees bilaterally demonstrating ability to ascend stairs in step-over  step pattern   Time 4   Period Weeks   Status Partially Met   PT LONG TERM GOAL #2   Title pt will improve gait speed to 1.75ms for improved home and community mobility.    Time 6   Period Weeks   Status Partially Met   PT LONG TERM GOAL #3   Title pt will demonstrate knee ROM to at least 5-120 degrees to be able to perform stairs normally using 1 Ue support.    Time 8   Period Weeks   Status Partially Met   PT LONG TERM GOAL #4   Title pt will perform 5x sit to stand in <15s demonstrating improved LE strength.    Time 8   Period Weeks   Status On-going               Plan - 06/06/15 1346    Clinical Impression Statement pt continuing to focus on maximizeing strength, balance and functional mobility vs ROM at this time as this will be done at home with devices coming as in clinic furhter ROM  gains were no longer carrying over and pt did not tolerate manual therapy well enough to make lasting gains in ROM.  pt demonstrated improved stability  on Cable column machine today.    Pt will benefit from skilled therapeutic intervention in order to improve on the following deficits Pain;Decreased strength;Decreased mobility;Impaired flexibility;Decreased range of motion;Abnormal gait;Decreased balance   Rehab Potential Fair   PT Frequency 2x / week   PT Duration 4 weeks   PT Treatment/Interventions Electrical Stimulation;Cryotherapy;Moist Heat;Balance training;DME Instruction;Therapeutic exercise;Manual techniques;Dry needling;Passive range of motion;Scar mobilization;Stair training;ADLs/Self Care Home Management;Gait training;Neuromuscular re-education   PT Next Visit Plan 6 min walk test   Consulted and Agree with Plan of Care Patient;Family member/caregiver        Problem List Patient Active Problem List   Diagnosis Date Noted  . Arthrofibrosis of total knee arthroplasty 04/22/2015  . Status post total bilateral knee replacement 03/12/2015  . Acute blood loss anemia 03/12/2015  . Status post bilateral knee replacements 03/12/2015  . Postoperative anemia due to acute blood loss 03/11/2015  . OA (osteoarthritis) of knee 03/07/2015   AGorden Harms Nikkia Devoss, PT, DPT #[id number] [name] 06/06/2015, 1:50 PM  [place] [facility] Hospital [facility] HoustonMAIN RCentracare Health Sys MelroseSERVICES [address]Co[address] [place] [address]Phone: [phone]  Fax:  [phone]

## 2015-06-11 ENCOUNTER — Ambulatory Visit: Payer: PRIVATE HEALTH INSURANCE

## 2015-06-11 DIAGNOSIS — M25662 Stiffness of left knee, not elsewhere classified: Secondary | ICD-10-CM

## 2015-06-11 DIAGNOSIS — M25569 Pain in unspecified knee: Secondary | ICD-10-CM

## 2015-06-11 DIAGNOSIS — M25661 Stiffness of right knee, not elsewhere classified: Secondary | ICD-10-CM

## 2015-06-11 NOTE — Therapy (Signed)
[place] MAIN [facility] SERVICES [address] [place], [place], [address] Phone: [phone]   Fax:  [phone]  Physical Therapy Treatment  Patient Details  Name: Jamie Martinez MRN: [id number] Date of Birth: Apr 29, 1956 Referring Provider:  Gerrit Halls, PA-C  Encounter Date: 06/11/2015      PT End of Session - 06/11/15 1221    Visit Number 26   Number of Visits 31   Date for PT Re-Evaluation 06/19/15   PT Start Time 2409   PT Stop Time 1054   PT Time Calculation (min) 52 min   Equipment Utilized During Treatment --  ice pack   Activity Tolerance Patient limited by pain   Behavior During Therapy [facility] for tasks assessed/performed      Past Medical History  Diagnosis Date  . Hypothyroidism   . Arthritis   . Cancer 2010    left breast surgery and radiation ,  dec 2013 colon chemo done for colon  . DVT of leg (deep venous thrombosis) 02-2015    Left leg    Past Surgical History  Procedure Laterality Date  . Breast surgery Left 2010    lumpectomy done, lymph nodes removed   . Abdominal hysterectomy      partial  . Colon surgery  dec 2013    surgery for colon cancer, part of bladder, part of pelvic wall removed appendix removed 1 ovary removed   . Appendectomy  dec 2013  . Knee arthroscopy Bilateral   . Total knee arthroplasty Bilateral 03/07/2015    Procedure: BILATERAL TOTAL KNEE ARTHROPLASTY;  Surgeon: Gaynelle Arabian, MD;  Location: [facility];  Service: Orthopedics;  Laterality: Bilateral;  +epidural  . Knee closed reduction Bilateral 04/23/2015    Procedure: BILATERAL KNEE CLOSED MANIPULATION;  Surgeon: Gaynelle Arabian, MD;  Location: [facility];  Service: Orthopedics;  Laterality: Bilateral;    There were no vitals filed for this visit.  Visit Diagnosis:  Knee pain, unspecified laterality  Knee stiff, right  Knee stiff, left      Subjective Assessment - 06/11/15 1220    Subjective pt reports she finds it hard to conquer bad  habits with her legs that she has been doing for many years, but her husband helps cue her.    Patient is accompained by: Family member   Patient Stated Goals Pt reports she wants to be able to walk normal and work in the yard.    Pain Score 3    Pain Location Knee   Pain Orientation Right;Left   Pain Descriptors / Indicators Aching     Treatment: Therex: Nustep L 3 x 5 min seat level 7 for ROM-  Knee extension: 2 plates 3x10 with stretching and at end range flexion with weighted over pressure x 1 min Hamstring curls 5 plates 3x10 with stretching and at end range flexion with weighted over pressure x 1 min Fwd step up 6inches no UE x 10 each leg Side step ups with a squat 2x10 no UE "butt burners" , standing clamshell x 5 side steps x 5, squat to HR x 5 then back- yellow band x 3 laps  Pt neesd min-mod cues for proper performance of exercises.   Manual therapy: Patellar mobilizations superior/inferior grade III-IV x 1 min AP/PA tibiofemoral mobs grade III-IV 15s x 3-4 each Knee extension into over pressure bilaterally 10-15 oscillations of moderate over pressure to patients tolerance x 5 each     followed by cryotherapy to both knees in  sitting x 8 min                        PT Education - 06/11/15 1221    Education provided Yes   Education Details continue hamstring and calf stretching at home.   Person(s) Educated Patient   Methods Explanation;Tactile cues;Demonstration   Comprehension Verbalized understanding;Returned demonstration             PT Long Term Goals - 06/06/15 1349    PT LONG TERM GOAL #1   Title pt will demonstrate knee AROM 5-90degrees bilaterally demonstrating ability to ascend stairs in step-over step pattern   Time 4   Period Weeks   Status Partially Met   PT LONG TERM GOAL #2   Title pt will improve gait speed to 1.30ms for improved home and community mobility.    Time 6   Period Weeks   Status Partially Met   PT LONG TERM  GOAL #3   Title pt will demonstrate knee ROM to at least 5-120 degrees to be able to perform stairs normally using 1 Ue support.    Time 8   Period Weeks   Status Partially Met   PT LONG TERM GOAL #4   Title pt will perform 5x sit to stand in <15s demonstrating improved LE strength.    Time 8   Period Weeks   Status On-going               Plan - 06/11/15 1222    Clinical Impression Statement ROM today measures post manual therapy: RLE 15-90 deg, LLE 15-95 deg (measured in supine). knee extension has a firm end feel, with pt not able to tolerate more pain into over pressure in that direction. pts insurance denied extensionator and flexionator home equipment. PT contacted [facility] for consult regarding her knee ROM particularily lack of extension. pt doing well wtih progression of strengthening and is able to tolerate more activity/exercise.     Pt will benefit from skilled therapeutic intervention in order to improve on the following deficits Pain;Decreased strength;Decreased mobility;Impaired flexibility;Decreased range of motion;Abnormal gait;Decreased balance   Rehab Potential Fair   PT Frequency 2x / week   PT Duration 4 weeks   PT Treatment/Interventions Electrical Stimulation;Cryotherapy;Moist Heat;Balance training;DME Instruction;Therapeutic exercise;Manual techniques;Dry needling;Passive range of motion;Scar mobilization;Stair training;ADLs/Self Care Home Management;Gait training;Neuromuscular re-education   PT Next Visit Plan 6 min walk test   Consulted and Agree with Plan of Care Patient;Family member/caregiver        Problem List Patient Active Problem List   Diagnosis Date Noted  . Arthrofibrosis of total knee arthroplasty 04/22/2015  . Status post total bilateral knee replacement 03/12/2015  . Acute blood loss anemia 03/12/2015  . Status post bilateral knee replacements 03/12/2015  . Postoperative anemia due to acute blood loss 03/11/2015  . OA  (osteoarthritis) of knee 03/07/2015   AGorden Harms Narada Uzzle, PT, DPT #[id number]  Livana Yerian 06/11/2015, 12:25 PM  CLake HartMAIN RFranc[facility] CitySERVICES [address] [address]Phone: [phone]  Fax:  [phone]

## 2015-06-13 ENCOUNTER — Ambulatory Visit: Payer: PRIVATE HEALTH INSURANCE

## 2015-06-13 DIAGNOSIS — M25569 Pain in unspecified knee: Secondary | ICD-10-CM

## 2015-06-13 DIAGNOSIS — M25661 Stiffness of right knee, not elsewhere classified: Secondary | ICD-10-CM

## 2015-06-13 DIAGNOSIS — R262 Difficulty in walking, not elsewhere classified: Secondary | ICD-10-CM

## 2015-06-13 DIAGNOSIS — M25662 Stiffness of left knee, not elsewhere classified: Secondary | ICD-10-CM

## 2015-06-13 NOTE — Therapy (Signed)
[place] MAIN [facility] SERVICES [address] [place], [place], [address] Phone: [phone]   Fax:  [phone]  Physical Therapy Treatment  Patient Details  Name: Jamie Martinez MRN: [id number] Date of Birth: [birth year] Referring Provider:  Maryland Pink, MD  Encounter Date: 06/13/2015      PT End of Session - 06/13/15 1242    Visit Number 27   Number of Visits 31   Date for PT Re-Evaluation 06/19/15   PT Start Time 3291   PT Stop Time 1055   PT Time Calculation (min) 53 min   Equipment Utilized During Treatment --  ice pack   Activity Tolerance Patient limited by pain   Behavior During Therapy [facility] for tasks assessed/performed      Past Medical History  Diagnosis Date  . Hypothyroidism   . Arthritis   . Cancer 2010    left breast surgery and radiation ,  dec 2013 colon chemo done for colon  . DVT of leg (deep venous thrombosis) 02-2015    Left leg    Past Surgical History  Procedure Laterality Date  . Breast surgery Left 2010    lumpectomy done, lymph nodes removed   . Abdominal hysterectomy      partial  . Colon surgery  dec 2013    surgery for colon cancer, part of bladder, part of pelvic wall removed appendix removed 1 ovary removed   . Appendectomy  dec 2013  . Knee arthroscopy Bilateral   . Total knee arthroplasty Bilateral 03/07/2015    Procedure: BILATERAL TOTAL KNEE ARTHROPLASTY;  Surgeon: Gaynelle Arabian, MD;  [facility];  Service: Orthopedics;  Laterality: Bilateral;  +epidural  . Knee closed reduction Bilateral 04/23/2015    Procedure: BILATERAL KNEE CLOSED MANIPULATION;  Surgeon: Gaynelle Arabian, MD;  [facility];  Service: Orthopedics;  Laterality: Bilateral;    There were no vitals filed for this visit.  Visit Diagnosis:  Knee pain, unspecified laterality  Knee stiff, right  Knee stiff, left  Difficulty walking      Subjective Assessment - 06/13/15 1241    Subjective pt reports she is feeling  stronger and able to tolerate more activity. pt reports she did all the grocery shopping yesterday without having to sit down due to pain or fatigue.    Patient is accompained by: Family member   Patient Stated Goals Pt reports she wants to be able to walk normal and work in the yard.    Currently in Pain? Yes   Pain Score 2    Pain Location --  bilateral knees     Therex: Nustep no charge warm up x 5 min L3  Leg press- single alternating legs 3 plates 3x10 Leg press "jumps" 2 plates 3x10 Resisted marching: 35lbs on cable column 2x30s 4 way "butt burners" yellow band x 3 laps Hamstring, calf, piriformis, and quad stretching 30-60s x 2 each Pt requires min cues for proper exercise performance.   Treatment followed by ice pack to bilateral knees in sitting.                            PT Education - 06/13/15 1242    Education provided Yes   Education Details emphasized home stretching particularly prone hangs and prone quad stretch    Person(s) Educated Patient;Spouse   Methods Explanation;Demonstration   Comprehension Verbalized understanding;Returned demonstration  PT Long Term Goals - 06/06/15 1349    PT LONG TERM GOAL #1   Title pt will demonstrate knee AROM 5-90degrees bilaterally demonstrating ability to ascend stairs in step-over step pattern   Time 4   Period Weeks   Status Partially Met   PT LONG TERM GOAL #2   Title pt will improve gait speed to 1.55ms for improved home and community mobility.    Time 6   Period Weeks   Status Partially Met   PT LONG TERM GOAL #3   Title pt will demonstrate knee ROM to at least 5-120 degrees to be able to perform stairs normally using 1 Ue support.    Time 8   Period Weeks   Status Partially Met   PT LONG TERM GOAL #4   Title pt will perform 5x sit to stand in <15s demonstrating improved LE strength.    Time 8   Period Weeks   Status On-going               Plan - 06/13/15 1247     Clinical Impression Statement progressed strengthening program today to maximize ROM usage and recruit more fast twitch fibers at times. pt able to achieve 8 -95 deg knee ROM bilaterally after stretching in prone today. emphasized HEP again today particularily prone stretching as this seemed to be more beneficial.    Pt will benefit from skilled therapeutic intervention in order to improve on the following deficits Pain;Decreased strength;Decreased mobility;Impaired flexibility;Decreased range of motion;Abnormal gait;Decreased balance   Rehab Potential Fair   PT Frequency 2x / week   PT Duration 4 weeks   PT Treatment/Interventions Electrical Stimulation;Cryotherapy;Moist Heat;Balance training;DME Instruction;Therapeutic exercise;Manual techniques;Dry needling;Passive range of motion;Scar mobilization;Stair training;ADLs/Self Care Home Management;Gait training;Neuromuscular re-education   Consulted and Agree with Plan of Care Patient;Family member/caregiver        Problem List Patient Active Problem List   Diagnosis Date Noted  . Arthrofibrosis of total knee arthroplasty 04/22/2015  . Status post total bilateral knee replacement 03/12/2015  . Acute blood loss anemia 03/12/2015  . Status post bilateral knee replacements 03/12/2015  . Postoperative anemia due to acute blood loss 03/11/2015  . OA (osteoarthritis) of knee 03/07/2015   AGorden Harms Veretta Sabourin, PT, DPT #[id number] Koty Anctil 06/13/2015, 12:49 PM  CWood LakeMAIN RGreater Springfield Surgery Center LLCSERVICES [address] [address]Phone: [phone]  Fax:  [phone]

## 2015-06-18 ENCOUNTER — Ambulatory Visit: Payer: PRIVATE HEALTH INSURANCE

## 2015-06-18 DIAGNOSIS — M25569 Pain in unspecified knee: Secondary | ICD-10-CM | POA: Diagnosis not present

## 2015-06-18 DIAGNOSIS — M25662 Stiffness of left knee, not elsewhere classified: Secondary | ICD-10-CM

## 2015-06-18 DIAGNOSIS — R262 Difficulty in walking, not elsewhere classified: Secondary | ICD-10-CM

## 2015-06-18 DIAGNOSIS — M25661 Stiffness of right knee, not elsewhere classified: Secondary | ICD-10-CM

## 2015-06-18 NOTE — Therapy (Signed)
[place] MAIN [facility] SERVICES [address] [place], [place], [address] Phone: [phone]   Fax:  [phone]  Physical Therapy Treatment  Patient Details  Name: Jamie Martinez MRN: [id number] Date of Birth: 11/07/1956 Referring Provider:  Maryland Pink, MD  Encounter Date: 06/18/2015      PT End of Session - 06/18/15 1238    Visit Number 28   Number of Visits 31   Date for PT Re-Evaluation 07/02/15   PT Start Time 1000   PT Stop Time 1055   PT Time Calculation (min) 55 min   Activity Tolerance Patient tolerated treatment well   Behavior During Therapy [facility] for tasks assessed/performed      Past Medical History  Diagnosis Date  . Hypothyroidism   . Arthritis   . Cancer 2010    left breast surgery and radiation ,  dec 2013 colon chemo done for colon  . DVT of leg (deep venous thrombosis) 02-2015    Left leg    Past Surgical History  Procedure Laterality Date  . Breast surgery Left 2010    lumpectomy done, lymph nodes removed   . Abdominal hysterectomy      partial  . Colon surgery  dec 2013    surgery for colon cancer, part of bladder, part of pelvic wall removed appendix removed 1 ovary removed   . Appendectomy  dec 2013  . Knee arthroscopy Bilateral   . Total knee arthroplasty Bilateral 03/07/2015    Procedure: BILATERAL TOTAL KNEE ARTHROPLASTY;  Surgeon: Gaynelle Arabian, MD;  [facility]: [facility];  Service: Orthopedics;  Laterality: Bilateral;  +epidural  . Knee closed reduction Bilateral 04/23/2015    Procedure: BILATERAL KNEE CLOSED MANIPULATION;  Surgeon: Gaynelle Arabian, MD;  [facility]: [facility];  Service: Orthopedics;  Laterality: Bilateral;    There were no vitals filed for this visit.  Visit Diagnosis:  Knee pain, unspecified laterality - Plan: PT plan of care cert/re-cert  Knee stiff, right - Plan: PT plan of care cert/re-cert  Knee stiff, left - Plan: PT plan of care cert/re-cert  Difficulty walking - Plan: PT plan of  care cert/re-cert      Subjective Assessment - 06/18/15 1140    Subjective pt reports she is getting around better. pt reports her pain is more localized to the anterior knee. pt reports she has been stretching quite a bit at home.    Patient is accompained by: Family member   Patient Stated Goals Pt reports she wants to be able to walk normal and work in the yard.    Pain Score 2    Pain Location --  bilateral knee pain         therex: Nustep no charge warm up x 3 min Leg press- single leg alternating 4 plates 2x10 Leg press jumps 2 plates 3x10 Hamstring curls 5 plates 3x10 Knee extension 3 plates 3x10 Squats to chair x 10 Fwd/side lunges x 10 each leg Heel taps 2x10 bilaterally with HHA Side steps with resistance band green x 17f each way Squat with med ball 8lbs x 10 Pt needs cues for proper exercise technique  Ice to bilateral knees following therex in sitting  x10 min                               PT Long Term Goals - 06/18/15 1242    PT LONG TERM GOAL #1   Title pt  will demonstrate knee AROM 5-90degrees bilaterally demonstrating ability to ascend stairs in step-over step pattern   Time 4   Period Weeks   Status Partially Met   PT LONG TERM GOAL #2   Title pt will improve gait speed to 1.81ms for improved home and community mobility.    Time 6   Period Weeks   Status Achieved   PT LONG TERM GOAL #3   Title pt will demonstrate knee ROM to at least 5-120 degrees to be able to perform stairs normally using 1 Ue support.    Time 8   Period Weeks   Status Partially Met   PT LONG TERM GOAL #4   Title pt will perform 5x sit to stand in <15s demonstrating improved LE strength.    Time 8   Period Weeks   Status Achieved               Plan - 06/18/15 1238    Clinical Impression Statement PT continues to progress pts LE strengthening, stability to maximize mobility and function. pt has partially met goals at this time. pt ROM  continues to be limited, although she has more ROM than she uses functionally at timess likely due to habit.  ROM measured in prone 10-90 deg bilaterally.    Pt will benefit from skilled therapeutic intervention in order to improve on the following deficits Pain;Decreased strength;Decreased mobility;Impaired flexibility;Decreased range of motion;Abnormal gait;Decreased balance   Rehab Potential Fair   PT Frequency 2x / week   PT Duration 2 weeks   PT Treatment/Interventions Electrical Stimulation;Cryotherapy;Moist Heat;Balance training;DME Instruction;Therapeutic exercise;Manual techniques;Dry needling;Passive range of motion;Scar mobilization;Stair training;ADLs/Self Care Home Management;Gait training;Neuromuscular re-education   Consulted and Agree with Plan of Care Patient;Family member/caregiver        Problem List Patient Active Problem List   Diagnosis Date Noted  . Arthrofibrosis of total knee arthroplasty 04/22/2015  . Status post total bilateral knee replacement 03/12/2015  . Acute blood loss anemia 03/12/2015  . Status post bilateral knee replacements 03/12/2015  . Postoperative anemia due to acute blood loss 03/11/2015  . OA (osteoarthritis) of knee 03/07/2015   AGorden Harms Niylah Hassan, PT, DPT #[id number] Moritz Lever 06/18/2015, 12:46 PM  CO[place]MAIN RFountain Valley Rgnl [facility]osp [facility] Med [facility] - WarnerSERVICES [address] [address]Phone: [phone]  Fax:  [phone]

## 2015-06-20 ENCOUNTER — Ambulatory Visit: Payer: PRIVATE HEALTH INSURANCE

## 2015-06-20 DIAGNOSIS — R262 Difficulty in walking, not elsewhere classified: Secondary | ICD-10-CM

## 2015-06-20 DIAGNOSIS — M25569 Pain in unspecified knee: Secondary | ICD-10-CM

## 2015-06-20 DIAGNOSIS — M25661 Stiffness of right knee, not elsewhere classified: Secondary | ICD-10-CM

## 2015-06-20 DIAGNOSIS — M25662 Stiffness of left knee, not elsewhere classified: Secondary | ICD-10-CM

## 2015-06-20 NOTE — Therapy (Signed)
[place] MAIN [facility] SERVICES [address] [place], [place], [address] Phone: [phone]   Fax:  [phone]  Physical Therapy Treatment  Patient Details  Name: Jamie Martinez MRN: [id number] Date of Birth: August 01, 1956 Referring Provider:  Maryland Pink, MD  Encounter Date: 06/20/2015      PT End of Session - 06/20/15 1238    Visit Number 29   Number of Visits 31   Date for PT Re-Evaluation 07/02/15   PT Start Time 1000   PT Stop Time 1050   PT Time Calculation (min) 50 min   Activity Tolerance Patient tolerated treatment well   Behavior During Therapy [facility] for tasks assessed/performed      Past Medical History  Diagnosis Date  . Hypothyroidism   . Arthritis   . Cancer 2010    left breast surgery and radiation ,  dec 2013 colon chemo done for colon  . DVT of leg (deep venous thrombosis) 02-2015    Left leg    Past Surgical History  Procedure Laterality Date  . Breast surgery Left 2010    lumpectomy done, lymph nodes removed   . Abdominal hysterectomy      partial  . Colon surgery  dec 2013    surgery for colon cancer, part of bladder, part of pelvic wall removed appendix removed 1 ovary removed   . Appendectomy  dec 2013  . Knee arthroscopy Bilateral   . Total knee arthroplasty Bilateral 03/07/2015    Procedure: BILATERAL TOTAL KNEE ARTHROPLASTY;  Surgeon: Gaynelle Arabian, MD;  [facility];  Service: Orthopedics;  Laterality: Bilateral;  +epidural  . Knee closed reduction Bilateral 04/23/2015    Procedure: BILATERAL KNEE CLOSED MANIPULATION;  Surgeon: Gaynelle Arabian, MD;  [facility];  Service: Orthopedics;  Laterality: Bilateral;    There were no vitals filed for this visit.  Visit Diagnosis:  Knee pain, unspecified laterality  Knee stiff, right  Knee stiff, left  Difficulty walking      Subjective Assessment - 06/20/15 1233    Subjective pt reports she goes back to see her surgeon tomorrow. she is happy  with her mobility, but frustrated with ROM.   Patient is accompained by: Family member   Patient Stated Goals Pt reports she wants to be able to walk normal and work in the yard.    Currently in Pain? Yes   Pain Score 2    Pain Location --  bilateral knees   Pain Orientation Anterior   Pain Descriptors / Indicators Aching       Nustep L 5 x 5 min warm up LE only- cues for SPM >65 Modified dead lift from elevated surface- 25lbs 2x10, mod -max cues for form, especially promoting knee flexion Resisted marching 5 plates 30s x 2 each way- 4 way Resisted walking with blue band for HEP- spouse educated also x 136f Butt burners red band x 3 laps Side steps red band x 3 laps Monster walk red band x 3 laps 4 way walking lunges 2x5 each with mod cues for proper mechanics  Ice to B kknees following session x 8 min                               PT Long Term Goals - 06/18/15 1242    PT LONG TERM GOAL #1   Title pt will demonstrate knee AROM 5-90degrees bilaterally demonstrating ability to ascend stairs in  step-over step pattern   Time 4   Period Weeks   Status Partially Met   PT LONG TERM GOAL #2   Title pt will improve gait speed to 1.105ms for improved home and community mobility.    Time 6   Period Weeks   Status Achieved   PT LONG TERM GOAL #3   Title pt will demonstrate knee ROM to at least 5-120 degrees to be able to perform stairs normally using 1 Ue support.    Time 8   Period Weeks   Status Partially Met   PT LONG TERM GOAL #4   Title pt will perform 5x sit to stand in <15s demonstrating improved LE strength.    Time 8   Period Weeks   Status Achieved               Plan - 06/20/15 1240    Clinical Impression Statement progressed HEP today for resisted hip strengthening and resisted walking. pt demonstrates nearly full knee extension in prone, but still has difficutly with maximizing knee extension in weight baring. plan for DC to  progressed HEP next visit as pts insurance coverage is running out and she declines to pay out of pocket for further PT visits.  pt has partially achieved goals.    Pt will benefit from skilled therapeutic intervention in order to improve on the following deficits Pain;Decreased strength;Decreased mobility;Impaired flexibility;Decreased range of motion;Abnormal gait;Decreased balance   Rehab Potential Fair   PT Frequency 2x / week   PT Duration 2 weeks   PT Treatment/Interventions Electrical Stimulation;Cryotherapy;Moist Heat;Balance training;DME Instruction;Therapeutic exercise;Manual techniques;Dry needling;Passive range of motion;Scar mobilization;Stair training;ADLs/Self Care Home Management;Gait training;Neuromuscular re-education   Consulted and Agree with Plan of Care Patient;Family member/caregiver        Problem List Patient Active Problem List   Diagnosis Date Noted  . Arthrofibrosis of total knee arthroplasty 04/22/2015  . Status post total bilateral knee replacement 03/12/2015  . Acute blood loss anemia 03/12/2015  . Status post bilateral knee replacements 03/12/2015  . Postoperative anemia due to acute blood loss 03/11/2015  . OA (osteoarthritis) of knee 03/07/2015   AGorden Harms Elad Macphail, PT, DPT #[id number] Jamie Martinez 06/20/2015, 12:47 PM  CSpring Valley LakeMAIN RCleveland Clinic Coral Springs Ambulatory Surgery CenterSERVICES [address] [address]Phone: [phone]  Fax:  [phone]

## 2015-06-20 NOTE — Patient Instructions (Signed)
[email or web address] Monster walks with red band  Side steps with red band Resisted walking 4 way with blue band  Progressed HEP

## 2015-06-26 ENCOUNTER — Ambulatory Visit: Payer: PRIVATE HEALTH INSURANCE | Attending: Family Medicine

## 2015-06-26 DIAGNOSIS — M25662 Stiffness of left knee, not elsewhere classified: Secondary | ICD-10-CM | POA: Insufficient documentation

## 2015-06-26 DIAGNOSIS — M25569 Pain in unspecified knee: Secondary | ICD-10-CM | POA: Insufficient documentation

## 2015-06-26 DIAGNOSIS — R262 Difficulty in walking, not elsewhere classified: Secondary | ICD-10-CM | POA: Diagnosis not present

## 2015-06-26 DIAGNOSIS — M25661 Stiffness of right knee, not elsewhere classified: Secondary | ICD-10-CM | POA: Insufficient documentation

## 2015-06-26 NOTE — Therapy (Signed)
[place] MAIN [facility] SERVICES [address] [place], [place], [address] Phone: [phone]   Fax:  [phone]  Physical Therapy Treatment/Discharge note  Patient Details  Name: Jamie Martinez MRN: [id number] Date of Birth: [birth year] Referring Provider:  Maryland Pink, MD  Encounter Date: 06/26/2015      PT End of Session - 06/26/15 1414    Visit Number 30   Number of Visits 31   Date for PT Re-Evaluation 07/02/15   PT Start Time 1115   PT Stop Time 1159   PT Time Calculation (min) 44 min   Activity Tolerance Patient tolerated treatment well;Patient limited by pain   Behavior During Therapy [facility] for tasks assessed/performed      Past Medical History  Diagnosis Date  . Hypothyroidism   . Arthritis   . Cancer 2010    left breast surgery and radiation ,  dec 2013 colon chemo done for colon  . DVT of leg (deep venous thrombosis) 02-2015    Left leg    Past Surgical History  Procedure Laterality Date  . Breast surgery Left 2010    lumpectomy done, lymph nodes removed   . Abdominal hysterectomy      partial  . Colon surgery  dec 2013    surgery for colon cancer, part of bladder, part of pelvic wall removed appendix removed 1 ovary removed   . Appendectomy  dec 2013  . Knee arthroscopy Bilateral   . Total knee arthroplasty Bilateral 03/07/2015    Procedure: BILATERAL TOTAL KNEE ARTHROPLASTY;  Surgeon: Gaynelle Arabian, MD;  Location: [facility];  Service: Orthopedics;  Laterality: Bilateral;  +epidural  . Knee closed reduction Bilateral 04/23/2015    Procedure: BILATERAL KNEE CLOSED MANIPULATION;  Surgeon: Gaynelle Arabian, MD;  Location: [facility];  Service: Orthopedics;  Laterality: Bilateral;    There were no vitals filed for this visit.  Visit Diagnosis:  Knee stiff, left  Knee stiff, right  Knee pain, unspecified laterality  Difficulty walking      Subjective Assessment - 06/26/15 1409    Subjective pt reports she had follow up  with the surgeon last week. she reports it went well, he expects improved ROM by 6 mo- post op. pt reports MD recommends splinting for extension. previous PT recommendation for extensionator was denied by her insurance company.    Patient is accompained by: Family member   Patient Stated Goals Pt reports she wants to be able to walk normal and work in the yard.    Currently in Pain? Yes   Pain Score 2    Pain Location --  B knees      Therex: 14mn walk test: [id number] marches- 4 way , 5 plates 2x30s each way Fwd/side lunges x 10 each way , each leg Pt performed stair negotiation , step over step with single handrail. Pt able to do this , was painful due to more knee flexion required to descend steps.  TKE with ball on wall x 10 each leg Verbal instruction of HEP, including resisted hip exercises with band, resisted walking with band, walking program intervals: IE 2 min fast walk:[id number] x 20 min, review stretching program Focus on using available knee extension in weight baring, as she demonstrates -10deg extension in non-WB but this increases to about -20 deg in weight baring, limited by pain. Recommended TKEs at home   ice to bilateral knees x 10 min following session  PT Education - 06/26/15 1413    Education provided Yes   Education Details progressed HEP, interval walking program             PT Long Term Goals - 06/26/15 1417    PT LONG TERM GOAL #1   Title pt will demonstrate knee AROM 5-90degrees bilaterally demonstrating ability to ascend stairs in step-over step pattern   Time 4   Period Weeks   Status Partially Met   PT LONG TERM GOAL #2   Title pt will improve gait speed to 1.9ms for improved home and community mobility.    Baseline 1.218m today   Time 6   Period Weeks   Status Achieved   PT LONG TERM GOAL #3   Title pt will demonstrate knee ROM to at least 5-120 degrees to be able to perform stairs normally  using 1 Ue support.    Time 8   Period Weeks   Status Partially Met   PT LONG TERM GOAL #4   Title pt will perform 5x sit to stand in <15s demonstrating improved LE strength.    Time 8   Period Weeks   Status Achieved               Plan - 06/26/15 1415    Clinical Impression Statement pt has achieved majority of PT goals at this time regarding her mobility. pt has not reached goals regarding ROM, but has not shown significant improvement in that area due to pain. pt will be recommended JAS splinting as former recommendation for extensionator was denied by insruance to maximize knee extension bilaterally using low load, long duration stretching as pt cannot tolerate manual PROM enough to improve ROM. pt has met majority of PT goals and will be DC to progressed HEP with recommedation for JAS splint to maximize knee extnesion on a home basis. pt was able to ambualte x [id number] 6 min today which was a significant improvement and close to norm for her age.    Pt will benefit from skilled therapeutic intervention in order to improve on the following deficits Pain;Decreased strength;Decreased mobility;Impaired flexibility;Decreased range of motion;Abnormal gait;Decreased balance   Rehab Potential Fair   PT Frequency 2x / week   PT Duration 2 weeks   PT Treatment/Interventions Electrical Stimulation;Cryotherapy;Moist Heat;Balance training;DME Instruction;Therapeutic exercise;Manual techniques;Dry needling;Passive range of motion;Scar mobilization;Stair training;ADLs/Self Care Home Management;Gait training;Neuromuscular re-education   Consulted and Agree with Plan of Care Patient;Family member/caregiver        Problem List Patient Active Problem List   Diagnosis Date Noted  . Arthrofibrosis of total knee arthroplasty 04/22/2015  . Status post total bilateral knee replacement 03/12/2015  . Acute blood loss anemia 03/12/2015  . Status post bilateral knee replacements 03/12/2015  .  Postoperative anemia due to acute blood loss 03/11/2015  . OA (osteoarthritis) of knee 03/07/2015   AshGorden Harmsortorici, PT, DPT #[id number],Tajon Moring 06/26/2015, 2:19 PM  [facility] [address] [place]one: [phone]:  [phone]

## 2015-08-28 ENCOUNTER — Inpatient Hospital Stay: Payer: PRIVATE HEALTH INSURANCE

## 2015-08-30 ENCOUNTER — Ambulatory Visit: Payer: PRIVATE HEALTH INSURANCE | Admitting: Oncology

## 2015-09-04 ENCOUNTER — Inpatient Hospital Stay: Payer: PRIVATE HEALTH INSURANCE | Attending: Oncology

## 2015-09-04 DIAGNOSIS — Z170 Estrogen receptor positive status [ER+]: Secondary | ICD-10-CM | POA: Diagnosis not present

## 2015-09-04 DIAGNOSIS — M1990 Unspecified osteoarthritis, unspecified site: Secondary | ICD-10-CM | POA: Insufficient documentation

## 2015-09-04 DIAGNOSIS — Z9221 Personal history of antineoplastic chemotherapy: Secondary | ICD-10-CM | POA: Insufficient documentation

## 2015-09-04 DIAGNOSIS — E039 Hypothyroidism, unspecified: Secondary | ICD-10-CM | POA: Insufficient documentation

## 2015-09-04 DIAGNOSIS — Z86718 Personal history of other venous thrombosis and embolism: Secondary | ICD-10-CM | POA: Diagnosis not present

## 2015-09-04 DIAGNOSIS — Z87891 Personal history of nicotine dependence: Secondary | ICD-10-CM | POA: Diagnosis not present

## 2015-09-04 DIAGNOSIS — Z79899 Other long term (current) drug therapy: Secondary | ICD-10-CM | POA: Insufficient documentation

## 2015-09-04 DIAGNOSIS — Z96653 Presence of artificial knee joint, bilateral: Secondary | ICD-10-CM | POA: Insufficient documentation

## 2015-09-04 DIAGNOSIS — Z923 Personal history of irradiation: Secondary | ICD-10-CM | POA: Diagnosis not present

## 2015-09-04 DIAGNOSIS — C50912 Malignant neoplasm of unspecified site of left female breast: Secondary | ICD-10-CM | POA: Insufficient documentation

## 2015-09-04 DIAGNOSIS — Z85038 Personal history of other malignant neoplasm of large intestine: Secondary | ICD-10-CM | POA: Insufficient documentation

## 2015-09-04 DIAGNOSIS — C189 Malignant neoplasm of colon, unspecified: Secondary | ICD-10-CM

## 2015-09-04 LAB — CBC WITH DIFFERENTIAL/PLATELET
BASOS PCT: 1 %
Basophils Absolute: 0 10*3/uL (ref 0–0.1)
Eosinophils Absolute: 0 10*3/uL (ref 0–0.7)
Eosinophils Relative: 1 %
HEMATOCRIT: 40.6 % (ref 35.0–47.0)
Hemoglobin: 13.7 g/dL (ref 12.0–16.0)
LYMPHS PCT: 26 %
Lymphs Abs: 1.6 10*3/uL (ref 1.0–3.6)
MCH: 30.1 pg (ref 26.0–34.0)
MCHC: 33.8 g/dL (ref 32.0–36.0)
MCV: 89 fL (ref 80.0–100.0)
MONO ABS: 0.4 10*3/uL (ref 0.2–0.9)
Monocytes Relative: 6 %
Neutro Abs: 4 10*3/uL (ref 1.4–6.5)
Neutrophils Relative %: 66 %
Platelets: 191 10*3/uL (ref 150–440)
RBC: 4.56 MIL/uL (ref 3.80–5.20)
RDW: 15.1 % — AB (ref 11.5–14.5)
WBC: 6 10*3/uL (ref 3.6–11.0)

## 2015-09-04 LAB — BASIC METABOLIC PANEL
Anion gap: 7 (ref 5–15)
BUN: 13 mg/dL (ref 6–20)
CO2: 26 mmol/L (ref 22–32)
CREATININE: 0.53 mg/dL (ref 0.44–1.00)
Calcium: 8.6 mg/dL — ABNORMAL LOW (ref 8.9–10.3)
Chloride: 105 mmol/L (ref 101–111)
GFR calc Af Amer: >60 mL/min (ref >60)
GFR calc non Af Amer: >60 mL/min (ref >60)
GLUCOSE: 104 mg/dL — AB (ref 65–99)
Potassium: 4 mmol/L (ref 3.5–5.1)
Sodium: 138 mmol/L (ref 135–145)

## 2015-09-05 LAB — CEA: CEA: 0.7 ng/mL (ref 0.0–4.7)

## 2015-09-05 LAB — CANCER ANTIGEN 27.29: CA 27.29: 25.6 U/mL (ref 0.0–38.6)

## 2015-09-06 ENCOUNTER — Inpatient Hospital Stay (HOSPITAL_BASED_OUTPATIENT_CLINIC_OR_DEPARTMENT_OTHER): Payer: PRIVATE HEALTH INSURANCE | Admitting: Oncology

## 2015-09-06 ENCOUNTER — Inpatient Hospital Stay: Payer: PRIVATE HEALTH INSURANCE | Admitting: Oncology

## 2015-09-06 VITALS — BP 127/84 | HR 77 | Temp 98.0°F | Resp 16 | Wt 194.7 lb

## 2015-09-06 DIAGNOSIS — Z79899 Other long term (current) drug therapy: Secondary | ICD-10-CM

## 2015-09-06 DIAGNOSIS — Z9221 Personal history of antineoplastic chemotherapy: Secondary | ICD-10-CM

## 2015-09-06 DIAGNOSIS — C189 Malignant neoplasm of colon, unspecified: Secondary | ICD-10-CM

## 2015-09-06 DIAGNOSIS — Z85038 Personal history of other malignant neoplasm of large intestine: Secondary | ICD-10-CM

## 2015-09-06 DIAGNOSIS — Z87891 Personal history of nicotine dependence: Secondary | ICD-10-CM

## 2015-09-06 DIAGNOSIS — C50912 Malignant neoplasm of unspecified site of left female breast: Secondary | ICD-10-CM

## 2015-09-06 DIAGNOSIS — Z170 Estrogen receptor positive status [ER+]: Secondary | ICD-10-CM

## 2015-09-06 DIAGNOSIS — Z96653 Presence of artificial knee joint, bilateral: Secondary | ICD-10-CM

## 2015-09-06 DIAGNOSIS — M1990 Unspecified osteoarthritis, unspecified site: Secondary | ICD-10-CM

## 2015-09-06 DIAGNOSIS — Z923 Personal history of irradiation: Secondary | ICD-10-CM

## 2015-09-06 DIAGNOSIS — E039 Hypothyroidism, unspecified: Secondary | ICD-10-CM

## 2015-09-06 DIAGNOSIS — Z86718 Personal history of other venous thrombosis and embolism: Secondary | ICD-10-CM

## 2015-09-06 NOTE — Progress Notes (Signed)
Patient had bilateral knee replacement surgery since her last visit and is doing well.

## 2015-09-16 NOTE — Progress Notes (Signed)
[place]  [phone] [phone]  ID: Jamie Martinez OB: 1956/08/15  MR#: [id number]  TMH#:[id number]  Patient Care Team: Maryland Pink, MD as PCP - General (Family Medicine)  CHIEF COMPLAINT:  Chief Complaint  Patient presents with  . Colon Cancer  . Breast Cancer    INTERVAL HISTORY: [name] returns to clinic today for routine evaluation and laboratory work. Since her last visit, she has had a bilateral knee replacement. She continues to have mild pain, but otherwise feels well. She has no neurologic complaints.  She denies any easy bleeding or bruising.  She denies any fevers, chills, or night sweats. She has no chest pain, shortness of breath, cough, or hemoptysis.  She has a good appetite and denies weight loss.  She denies any nausea vomiting, constipation, or diarrhea.  She denies any melena or hematochezia.  She has no urinary complaints.  She offers no further specific complaints today.   REVIEW OF SYSTEMS:   Review of Systems  Constitutional: Negative for fever and malaise/fatigue.  Respiratory: Negative.   Cardiovascular: Negative.   Gastrointestinal: Negative.   Musculoskeletal: Positive for joint pain.  Neurological: Negative for weakness.    As per HPI. Otherwise, a complete review of systems is negatve.  PAST MEDICAL HISTORY: Past Medical History  Diagnosis Date  . Hypothyroidism   . Arthritis   . Cancer 2010    left breast surgery and radiation ,  dec 2013 colon chemo done for colon  . DVT of leg (deep venous thrombosis) 02-2015    Left leg    PAST SURGICAL HISTORY: Past Surgical History  Procedure Laterality Date  . Breast surgery Left 2010    lumpectomy done, lymph nodes removed   . Abdominal hysterectomy      partial  . Colon surgery  dec 2013    surgery for colon cancer, part of bladder, part of pelvic wall removed appendix removed 1 ovary removed   . Appendectomy  dec 2013  . Knee arthroscopy Bilateral    . Total knee arthroplasty Bilateral 03/07/2015    Procedure: BILATERAL TOTAL KNEE ARTHROPLASTY;  Surgeon: Gaynelle Arabian, MD;  Location: [facility];  Service: Orthopedics;  Laterality: Bilateral;  +epidural  . Knee closed reduction Bilateral 04/23/2015    Procedure: BILATERAL KNEE CLOSED MANIPULATION;  Surgeon: Gaynelle Arabian, MD;  Location: [facility];  Service: Orthopedics;  Laterality: Bilateral;    FAMILY HISTORY No family history on file.     ADVANCED DIRECTIVES:    HEALTH MAINTENANCE: Social History  Substance Use Topics  . Smoking status: Former Smoker    Quit date: 05/24/2012  . Smokeless tobacco: Never Used  . Alcohol Use: No     Colonoscopy:  PAP:  Bone density:  Lipid panel:  Allergies  Allergen Reactions  . Tramadol Nausea Only and Nausea And Vomiting    Current Outpatient Prescriptions  Medication Sig Dispense Refill  . acetaminophen (TYLENOL) 500 MG tablet Take 500 mg by mouth every 6 (six) hours as needed (Pain).    Marland Kitchen letrozole (FEMARA) 2.5 MG tablet Take 1 tablet (2.5 mg total) by mouth daily. 30 tablet 1  . levothyroxine (SYNTHROID, LEVOTHROID) 300 MCG tablet Take 1 tablet (300 mcg total) by mouth every evening. 30 tablet 1   No current facility-administered medications for this visit.    OBJECTIVE: Filed Vitals:   09/06/15 1352  BP: 127/84  Pulse: 77  Temp: 98 F (36.7 C)  Resp: 16     Body mass index  is 29.61 kg/(m^2).    ECOG FS:0 - Asymptomatic  General: Well-developed, well-nourished, no acute distress. Eyes: Pink conjunctiva, anicteric sclera. Breasts: Patient has requested exam be deferred today.  Lungs: Clear to auscultation bilaterally. Heart: Regular rate and rhythm. No rubs, murmurs, or gallops. Abdomen: Soft, nontender, nondistended. No organomegaly noted, normoactive bowel sounds. Musculoskeletal: No edema, cyanosis, or clubbing. Neuro: Alert, answering all questions appropriately. Cranial nerves grossly intact. Skin: No rashes or  petechiae noted. Psych: Normal affect.   LAB RESULTS:  Lab Results  Component Value Date   NA 138 09/04/2015   K 4.0 09/04/2015   CL 105 09/04/2015   CO2 26 09/04/2015   GLUCOSE 104* 09/04/2015   BUN 13 09/04/2015   CREATININE 0.53 09/04/2015   CALCIUM 8.6* 09/04/2015   PROT 5.5* 03/12/2015   ALBUMIN 2.6* 03/12/2015   AST 19 03/12/2015   ALT 15 03/12/2015   ALKPHOS 59 03/12/2015   BILITOT 0.7 03/12/2015   GFRNONAA >60 09/04/2015   GFRAA >60 09/04/2015    Lab Results  Component Value Date   WBC 6.0 09/04/2015   NEUTROABS 4.0 09/04/2015   HGB 13.7 09/04/2015   HCT 40.6 09/04/2015   MCV 89.0 09/04/2015   PLT 191 09/04/2015     STUDIES: No results found.  ASSESSMENT: Stage Ia adenocarcinoma breast, stage IIa adenocarcinoma of the colon with positive margins.  PLAN:    1.  Breast cancer: No evidence of disease.  Most recent mammogram on February 27, 2015 was reported as BI-RADS 2. Repeat in March 2017.  Continue letrozole completing 5 years of treatment in August of 2020.  Bone mineral density on March 14, 2014 was reported as -1.0 which is within normal limits. Repeat in March 2017.  2.  Colon cancer:  Although patient's stage IIa, she was noted to have positive margins therefore she received 12 cycles of adjuvant FOLFOX. CT scan in March 2016 no evidence of recurrence. Because of her positive margins, she is at high risk for recurrence and will repeat CT scan yearly. CEA continues to be within normal limits.  Colonoscopy in December 2014 removed 2 polyps, but otherwise was within normal limits.   3. Disposition: Return to clinic in 6 months with repeat CT scan, mammogram, and laboratory work.   Patient expressed understanding and was in agreement with this plan. She also understands that She can call clinic at any time with any questions, concerns, or complaints.   No matching staging information was found for the patient.  Lloyd Huger, MD   09/16/2015 8:08  AM

## 2016-03-06 ENCOUNTER — Other Ambulatory Visit: Payer: PRIVATE HEALTH INSURANCE

## 2016-03-06 ENCOUNTER — Ambulatory Visit: Payer: PRIVATE HEALTH INSURANCE

## 2016-03-06 ENCOUNTER — Ambulatory Visit
Admission: RE | Admit: 2016-03-06 | Discharge: 2016-03-06 | Disposition: A | Discharge status: Home / Self Care | Payer: PRIVATE HEALTH INSURANCE | Source: Ambulatory Visit | Attending: Oncology | Admitting: Oncology

## 2016-03-06 ENCOUNTER — Inpatient Hospital Stay: Payer: Self-pay | Attending: Oncology

## 2016-03-06 DIAGNOSIS — Z79811 Long term (current) use of aromatase inhibitors: Secondary | ICD-10-CM | POA: Insufficient documentation

## 2016-03-06 DIAGNOSIS — K8020 Calculus of gallbladder without cholecystitis without obstruction: Secondary | ICD-10-CM | POA: Insufficient documentation

## 2016-03-06 DIAGNOSIS — Z170 Estrogen receptor positive status [ER+]: Secondary | ICD-10-CM | POA: Insufficient documentation

## 2016-03-06 DIAGNOSIS — Z923 Personal history of irradiation: Secondary | ICD-10-CM | POA: Insufficient documentation

## 2016-03-06 DIAGNOSIS — C50919 Malignant neoplasm of unspecified site of unspecified female breast: Secondary | ICD-10-CM | POA: Diagnosis present

## 2016-03-06 DIAGNOSIS — Z79899 Other long term (current) drug therapy: Secondary | ICD-10-CM | POA: Insufficient documentation

## 2016-03-06 DIAGNOSIS — N8189 Other female genital prolapse: Secondary | ICD-10-CM | POA: Diagnosis not present

## 2016-03-06 DIAGNOSIS — N8110 Cystocele, unspecified: Secondary | ICD-10-CM | POA: Insufficient documentation

## 2016-03-06 DIAGNOSIS — M1990 Unspecified osteoarthritis, unspecified site: Secondary | ICD-10-CM | POA: Insufficient documentation

## 2016-03-06 DIAGNOSIS — C189 Malignant neoplasm of colon, unspecified: Secondary | ICD-10-CM

## 2016-03-06 DIAGNOSIS — E039 Hypothyroidism, unspecified: Secondary | ICD-10-CM | POA: Insufficient documentation

## 2016-03-06 DIAGNOSIS — Z85038 Personal history of other malignant neoplasm of large intestine: Secondary | ICD-10-CM | POA: Insufficient documentation

## 2016-03-06 DIAGNOSIS — Z90710 Acquired absence of both cervix and uterus: Secondary | ICD-10-CM | POA: Diagnosis not present

## 2016-03-06 DIAGNOSIS — Z9221 Personal history of antineoplastic chemotherapy: Secondary | ICD-10-CM | POA: Insufficient documentation

## 2016-03-06 DIAGNOSIS — Z86718 Personal history of other venous thrombosis and embolism: Secondary | ICD-10-CM | POA: Insufficient documentation

## 2016-03-06 DIAGNOSIS — Z87891 Personal history of nicotine dependence: Secondary | ICD-10-CM | POA: Insufficient documentation

## 2016-03-06 DIAGNOSIS — C50912 Malignant neoplasm of unspecified site of left female breast: Secondary | ICD-10-CM | POA: Insufficient documentation

## 2016-03-06 LAB — CBC WITH DIFFERENTIAL/PLATELET
BASOS PCT: 1 %
Basophils Absolute: 0 10*3/uL (ref 0–0.1)
Eosinophils Absolute: 0 10*3/uL (ref 0–0.7)
Eosinophils Relative: 1 %
HEMATOCRIT: 38.7 % (ref 35.0–47.0)
HEMOGLOBIN: 13.3 g/dL (ref 12.0–16.0)
LYMPHS ABS: 1.8 10*3/uL (ref 1.0–3.6)
Lymphocytes Relative: 32 %
MCH: 30.9 pg (ref 26.0–34.0)
MCHC: 34.4 g/dL (ref 32.0–36.0)
MCV: 89.8 fL (ref 80.0–100.0)
MONOS PCT: 5 %
Monocytes Absolute: 0.3 10*3/uL (ref 0.2–0.9)
NEUTROS ABS: 3.5 10*3/uL (ref 1.4–6.5)
NEUTROS PCT: 61 %
Platelets: 189 10*3/uL (ref 150–440)
RBC: 4.31 MIL/uL (ref 3.80–5.20)
RDW: 13.9 % (ref 11.5–14.5)
WBC: 5.7 10*3/uL (ref 3.6–11.0)

## 2016-03-06 LAB — BASIC METABOLIC PANEL
Anion gap: 9 (ref 5–15)
BUN: 10 mg/dL (ref 6–20)
CHLORIDE: 103 mmol/L (ref 101–111)
CO2: 26 mmol/L (ref 22–32)
Calcium: 8.9 mg/dL (ref 8.9–10.3)
Creatinine, Ser: 0.56 mg/dL (ref 0.44–1.00)
GFR calc Af Amer: >60 mL/min (ref >60)
GFR calc non Af Amer: >60 mL/min (ref >60)
GLUCOSE: 94 mg/dL (ref 65–99)
POTASSIUM: 3 mmol/L — AB (ref 3.5–5.1)
SODIUM: 138 mmol/L (ref 135–145)

## 2016-03-06 MED ORDER — IOHEXOL 300 MG/ML  SOLN
100.0000 mL | Freq: Once | INTRAMUSCULAR | Status: AC | PRN
  Administered 2016-03-06: 100 mL via INTRAVENOUS

## 2016-03-07 LAB — CEA: CEA: 1.1 ng/mL (ref 0.0–4.7)

## 2016-03-07 LAB — CANCER ANTIGEN 27.29: CA 27.29: 23.6 U/mL (ref 0.0–38.6)

## 2016-03-13 ENCOUNTER — Inpatient Hospital Stay: Payer: PRIVATE HEALTH INSURANCE | Admitting: Oncology

## 2016-03-13 ENCOUNTER — Inpatient Hospital Stay (HOSPITAL_BASED_OUTPATIENT_CLINIC_OR_DEPARTMENT_OTHER): Payer: PRIVATE HEALTH INSURANCE | Admitting: Oncology

## 2016-03-13 ENCOUNTER — Ambulatory Visit: Payer: PRIVATE HEALTH INSURANCE | Admitting: Oncology

## 2016-03-13 VITALS — BP 134/87 | HR 80 | Temp 97.4°F | Resp 18 | Wt 210.5 lb

## 2016-03-13 DIAGNOSIS — M1990 Unspecified osteoarthritis, unspecified site: Secondary | ICD-10-CM

## 2016-03-13 DIAGNOSIS — Z79811 Long term (current) use of aromatase inhibitors: Secondary | ICD-10-CM | POA: Diagnosis not present

## 2016-03-13 DIAGNOSIS — Z87891 Personal history of nicotine dependence: Secondary | ICD-10-CM

## 2016-03-13 DIAGNOSIS — C50919 Malignant neoplasm of unspecified site of unspecified female breast: Secondary | ICD-10-CM | POA: Diagnosis not present

## 2016-03-13 DIAGNOSIS — Z85038 Personal history of other malignant neoplasm of large intestine: Secondary | ICD-10-CM

## 2016-03-13 DIAGNOSIS — Z9221 Personal history of antineoplastic chemotherapy: Secondary | ICD-10-CM

## 2016-03-13 DIAGNOSIS — E039 Hypothyroidism, unspecified: Secondary | ICD-10-CM

## 2016-03-13 DIAGNOSIS — Z170 Estrogen receptor positive status [ER+]: Secondary | ICD-10-CM

## 2016-03-13 DIAGNOSIS — Z923 Personal history of irradiation: Secondary | ICD-10-CM

## 2016-03-13 DIAGNOSIS — Z86718 Personal history of other venous thrombosis and embolism: Secondary | ICD-10-CM

## 2016-03-13 DIAGNOSIS — Z79899 Other long term (current) drug therapy: Secondary | ICD-10-CM

## 2016-03-13 DIAGNOSIS — C189 Malignant neoplasm of colon, unspecified: Secondary | ICD-10-CM

## 2016-03-13 NOTE — Progress Notes (Signed)
Patient does not offer any problems today.  

## 2016-03-13 NOTE — Progress Notes (Signed)
[place]  [phone] [phone]  ID: Jamie Martinez OB: May 08, 1956  MR#: [id number]  [id number]  Patient Care Team: Maryland Pink, MD as PCP - General (Family Medicine)  CHIEF COMPLAINT:  Chief Complaint  Patient presents with  . Colon Cancer  . Breast Cancer    INTERVAL HISTORY: [name] returns to clinic today for routine evaluation and laboratory work. Patient feels well today and is asymptomatic. She continues to tolerate letrozole well. She has no neurologic complaints.  She denies any easy bleeding or bruising.  She denies any fevers, chills, or night sweats. She has no chest pain, shortness of breath, cough, or hemoptysis.  She has a good appetite and denies weight loss.  She denies any nausea vomiting, constipation, or diarrhea.  She denies any melena or hematochezia.  She has no urinary complaints.  She offers no further specific complaints today.   REVIEW OF SYSTEMS:   Review of Systems  Constitutional: Negative for fever and malaise/fatigue.  Eyes: Negative.   Respiratory: Negative.   Cardiovascular: Negative.   Gastrointestinal: Negative.   Musculoskeletal: Negative.   Neurological: Negative for weakness.    As per HPI. Otherwise, a complete review of systems is negatve.  PAST MEDICAL HISTORY: Past Medical History  Diagnosis Date  . Hypothyroidism   . Arthritis   . Cancer 2010    left breast surgery and radiation ,  dec 2013 colon chemo done for colon  . DVT of leg (deep venous thrombosis) 02-2015    Left leg    PAST SURGICAL HISTORY: Past Surgical History  Procedure Laterality Date  . Breast surgery Left 2010    lumpectomy done, lymph nodes removed   . Abdominal hysterectomy      partial  . Colon surgery  dec 2013    surgery for colon cancer, part of bladder, part of pelvic wall removed appendix removed 1 ovary removed   . Appendectomy  dec 2013  . Knee arthroscopy Bilateral   . Total knee arthroplasty  Bilateral 03/07/2015    Procedure: BILATERAL TOTAL KNEE ARTHROPLASTY;  Surgeon: Gaynelle Arabian, MD;  Location: [facility];  Service: Orthopedics;  Laterality: Bilateral;  +epidural  . Knee closed reduction Bilateral 04/23/2015    Procedure: BILATERAL KNEE CLOSED MANIPULATION;  Surgeon: Gaynelle Arabian, MD;  Location: [facility];  Service: Orthopedics;  Laterality: Bilateral;    FAMILY HISTORY:  Reviewed and unchanged. No reported history of malignancy or chronic disease.     ADVANCED DIRECTIVES:    HEALTH MAINTENANCE: Social History  Substance Use Topics  . Smoking status: Former Smoker    Quit date: 05/24/2012  . Smokeless tobacco: Never Used  . Alcohol Use: No     Allergies  Allergen Reactions  . Tramadol Nausea Only and Nausea And Vomiting    Current Outpatient Prescriptions  Medication Sig Dispense Refill  . acetaminophen (TYLENOL) 500 MG tablet Take 500 mg by mouth every 6 (six) hours as needed (Pain).    Marland Kitchen letrozole (FEMARA) 2.5 MG tablet Take 1 tablet (2.5 mg total) by mouth daily. 30 tablet 1  . levothyroxine (SYNTHROID, LEVOTHROID) 200 MCG tablet TAKE 1 TABLET BY MOUTH ONCE DAILY ON EMPTY STOMACH WITH GLASS OF WATER 30-60 MINS BEFORE BREAKFAST  11  . sodium chloride (OCEAN) 0.65 % nasal spray Place into the nose.     No current facility-administered medications for this visit.    OBJECTIVE: Filed Vitals:   03/13/16 1511  BP: 134/87  Pulse: 80  Temp: 97.4  F (36.3 C)  Resp: 18     Body mass index is 32.02 kg/(m^2).    ECOG FS:0 - Asymptomatic  General: Well-developed, well-nourished, no acute distress. Eyes: Pink conjunctiva, anicteric sclera. Breasts: Patient has requested exam be deferred today.  Lungs: Clear to auscultation bilaterally. Heart: Regular rate and rhythm. No rubs, murmurs, or gallops. Abdomen: Soft, nontender, nondistended. No organomegaly noted, normoactive bowel sounds. Musculoskeletal: No edema, cyanosis, or clubbing. Neuro: Alert, answering all  questions appropriately. Cranial nerves grossly intact. Skin: No rashes or petechiae noted. Psych: Normal affect.   LAB RESULTS:  Lab Results  Component Value Date   NA 138 03/06/2016   K 3.0* 03/06/2016   CL 103 03/06/2016   CO2 26 03/06/2016   GLUCOSE 94 03/06/2016   BUN 10 03/06/2016   CREATININE 0.56 03/06/2016   CALCIUM 8.9 03/06/2016   PROT 5.5* 03/12/2015   ALBUMIN 2.6* 03/12/2015   AST 19 03/12/2015   ALT 15 03/12/2015   ALKPHOS 59 03/12/2015   BILITOT 0.7 03/12/2015   GFRNONAA >60 03/06/2016   GFRAA >60 03/06/2016    Lab Results  Component Value Date   WBC 5.7 03/06/2016   NEUTROABS 3.5 03/06/2016   HGB 13.3 03/06/2016   HCT 38.7 03/06/2016   MCV 89.8 03/06/2016   PLT 189 03/06/2016   Lab Results  Component Value Date   LABCA2 23.6 03/06/2016     STUDIES: Ct Abdomen Pelvis W Contrast  03/06/2016  CLINICAL DATA:  Left breast cancer diagnosed in 2010 and sigmoid colon cancer diagnosed in 2013 and resected and treated with chemotherapy due to positive surgical margins. Prior hysterectomy. Patient presents for restaging. EXAM: CT ABDOMEN AND PELVIS WITH CONTRAST TECHNIQUE: Multidetector CT imaging of the abdomen and pelvis was performed using the standard protocol following bolus administration of intravenous contrast. CONTRAST:  152mL OMNIPAQUE IOHEXOL 300 MG/ML  SOLN COMPARISON:  02/23/2015 CT abdomen/pelvis. FINDINGS: Lower chest: Left lower lobe 3 mm pulmonary nodule (series 4/ image 5) is stable since 10/26/2013 and benign. No new significant pulmonary nodules. Hepatobiliary: Normal liver with no liver mass. Layering subcentimeter gallstones in the nondistended gallbladder with no gallbladder wall thickening or pericholecystic fluid. No biliary ductal dilatation. Pancreas: Normal, with no mass or duct dilation. Spleen: Normal size. No mass. Adrenals/Urinary Tract: Normal adrenals. Normal kidneys with no hydronephrosis and no renal mass. Nondistended bladder with  no bladder wall thickening. Stable pelvic floor laxity with small cystocele. Stomach/Bowel: Grossly normal stomach. Normal caliber small bowel with no small bowel wall thickening. Status post appendectomy. Status post subtotal sigmoid resection, with no mass or wall thickening at the distal colonic anastomosis. No wall thickening or pericolonic fat stranding in the remnant colon. Vascular/Lymphatic: Atherosclerotic nonaneurysmal abdominal aorta. Patent portal, splenic, hepatic and renal veins. Portosystemic varices connecting the IMV to the IVC appear unchanged. No pathologically enlarged lymph nodes in the abdomen or pelvis. Reproductive: Status post hysterectomy, with no abnormal findings at the vaginal cuff. No adnexal mass. Other: No pneumoperitoneum, ascites or focal fluid collection. Musculoskeletal: No aggressive appearing focal osseous lesions. IMPRESSION: 1. No evidence of local tumor recurrence at the distal colonic anastomosis. 2. No evidence of metastatic disease in the abdomen or pelvis. 3. Cholelithiasis. 4. Stable pelvic floor laxity with small cystocele. Electronically Signed   By: Ilona Sorrel M.D.   On: 03/06/2016 13:27    ASSESSMENT: Stage Ia adenocarcinoma breast, stage IIa adenocarcinoma of the colon with positive margins.  PLAN:    1.  Breast cancer: No evidence  of disease. CA 27.29 continues to be WNL.  Mammogram is scheduled for March 21, 2016. Continue letrozole completing 5 years of treatment in August of 2020.  Bone mineral density on March 14, 2014 was reported as -1.0 which is within normal limits. She will have a repeat Bone Density in the next 1-2 weeks. 2.  Colon cancer:  Although patient's stage IIa, she was noted to have positive margins therefore she received 12 cycles of adjuvant FOLFOX. CT scan in March 2017 no evidence of recurrence. Because of her positive margins, she is at high risk for recurrence and will repeat CT scan every 6 months. CEA continues to be within  normal limits.  Colonoscopy in December 2014 removed 2 polyps, but otherwise was within normal limits.  Repeat colonoscopy in December 2017.  3. Disposition: Return to clinic in 6 months with repeat CT scan and laboratory work. If CT scan and colonoscopy later this year are reported within normal limits, can consider discontinuing imaging.   Patient expressed understanding and was in agreement with this plan. She also understands that She can call clinic at any time with any questions, concerns, or complaints.    Mayra Reel, NP   03/13/2016 3:49 PM  Patient was seen and evaluated independently and I agree with the assessment and plan as dictated above.  Lloyd Huger, MD 03/14/2016 6:16 AM

## 2016-03-21 ENCOUNTER — Ambulatory Visit
Admission: RE | Admit: 2016-03-21 | Discharge: 2016-03-21 | Disposition: A | Discharge status: Home / Self Care | Payer: No Typology Code available for payment source | Source: Ambulatory Visit | Attending: Oncology | Admitting: Oncology

## 2016-03-21 ENCOUNTER — Other Ambulatory Visit: Payer: Self-pay | Admitting: Oncology

## 2016-03-21 DIAGNOSIS — C50912 Malignant neoplasm of unspecified site of left female breast: Secondary | ICD-10-CM

## 2016-03-21 DIAGNOSIS — Z853 Personal history of malignant neoplasm of breast: Secondary | ICD-10-CM | POA: Diagnosis not present

## 2016-04-01 ENCOUNTER — Ambulatory Visit
Admission: RE | Admit: 2016-04-01 | Discharge: 2016-04-01 | Disposition: A | Discharge status: Home / Self Care | Payer: PRIVATE HEALTH INSURANCE | Source: Ambulatory Visit | Attending: Oncology | Admitting: Oncology

## 2016-04-01 DIAGNOSIS — C50919 Malignant neoplasm of unspecified site of unspecified female breast: Secondary | ICD-10-CM

## 2016-04-17 ENCOUNTER — Ambulatory Visit: Payer: PRIVATE HEALTH INSURANCE | Attending: Oncology

## 2016-09-09 ENCOUNTER — Ambulatory Visit: Admission: RE | Admit: 2016-09-09 | Payer: PRIVATE HEALTH INSURANCE | Source: Ambulatory Visit

## 2016-09-11 ENCOUNTER — Inpatient Hospital Stay: Payer: No Typology Code available for payment source | Admitting: Oncology

## 2016-09-22 ENCOUNTER — Ambulatory Visit
Admission: RE | Admit: 2016-09-22 | Discharge: 2016-09-22 | Disposition: A | Discharge status: Home / Self Care | Payer: No Typology Code available for payment source | Source: Ambulatory Visit | Attending: Oncology | Admitting: Oncology

## 2016-09-22 DIAGNOSIS — K8020 Calculus of gallbladder without cholecystitis without obstruction: Secondary | ICD-10-CM | POA: Diagnosis not present

## 2016-09-22 DIAGNOSIS — C189 Malignant neoplasm of colon, unspecified: Secondary | ICD-10-CM | POA: Diagnosis present

## 2016-09-22 DIAGNOSIS — I708 Atherosclerosis of other arteries: Secondary | ICD-10-CM | POA: Insufficient documentation

## 2016-09-22 DIAGNOSIS — N8110 Cystocele, unspecified: Secondary | ICD-10-CM | POA: Insufficient documentation

## 2016-09-22 DIAGNOSIS — K449 Diaphragmatic hernia without obstruction or gangrene: Secondary | ICD-10-CM | POA: Insufficient documentation

## 2016-09-22 DIAGNOSIS — I700 Atherosclerosis of aorta: Secondary | ICD-10-CM | POA: Diagnosis not present

## 2016-09-22 HISTORY — DX: Malignant neoplasm of colon, unspecified: C18.9

## 2016-09-22 MED ORDER — IOPAMIDOL (ISOVUE-300) INJECTION 61%
100.0000 mL | Freq: Once | INTRAVENOUS | Status: AC | PRN
  Administered 2016-09-22: 100 mL via INTRAVENOUS

## 2016-09-30 DIAGNOSIS — C772 Secondary and unspecified malignant neoplasm of intra-abdominal lymph nodes: Secondary | ICD-10-CM

## 2016-09-30 DIAGNOSIS — C187 Malignant neoplasm of sigmoid colon: Secondary | ICD-10-CM | POA: Insufficient documentation

## 2016-09-30 DIAGNOSIS — C50412 Malignant neoplasm of upper-outer quadrant of left female breast: Secondary | ICD-10-CM | POA: Insufficient documentation

## 2016-09-30 NOTE — Progress Notes (Signed)
[place]  [phone] [phone]  ID: Jamie Martinez OB: 1956-07-21  MR#: [id number]  [id number]  Patient Care Team: Jamie Lame, MD as PCP - General (Gastroenterology)  CHIEF COMPLAINT: Stage Ia adenocarcinoma of the upper outer quadrant of the left breast, stage IIa adenocarcinoma of the sigmoid colon with positive margins.  INTERVAL HISTORY: [name] returns to clinic today for routine evaluation and discussion of her imaging results. She continues to feel well today and is asymptomatic. She is tolerating letrozole well only with occasional hot flashes. She has no neurologic complaints. She denies any easy bleeding or bruising.  She denies any fevers, chills, or night sweats. She has no chest pain, shortness of breath, cough, or hemoptysis.  She has a good appetite and denies weight loss.  She denies any nausea vomiting, constipation, or diarrhea.  She denies any melena or hematochezia.  She has no urinary complaints.  She offers no specific complaints today.   REVIEW OF SYSTEMS:   Review of Systems  Constitutional: Negative for fever and malaise/fatigue.  Eyes: Negative.   Respiratory: Negative.  Negative for cough and shortness of breath.   Cardiovascular: Negative.  Negative for chest pain and leg swelling.  Gastrointestinal: Negative.  Negative for abdominal pain, blood in stool and melena.  Musculoskeletal: Negative.   Neurological: Positive for sensory change. Negative for weakness.  Psychiatric/Behavioral: Negative.  The patient is not nervous/anxious.     As per HPI. Otherwise, a complete review of systems is negative.  PAST MEDICAL HISTORY: Past Medical History:  Diagnosis Date  . Arthritis   . Cancer [facility]) 2010   left breast surgery and radiation ,  dec 2013 colon chemo done for colon  . Colon cancer [facility])    colon resection   . DVT of leg (deep venous thrombosis) (HCC) 02-2015   Left leg  . Hypothyroidism     PAST  SURGICAL HISTORY: Past Surgical History:  Procedure Laterality Date  . ABDOMINAL HYSTERECTOMY     partial  . APPENDECTOMY  dec 2013  . BREAST BIOPSY Left 2010   +  . BREAST SURGERY Left 2010   lumpectomy done, lymph nodes removed   . COLON SURGERY  dec 2013   surgery for colon cancer, part of bladder, part of pelvic wall removed appendix removed 1 ovary removed   . knee arthroscopy Bilateral   . KNEE CLOSED REDUCTION Bilateral 04/23/2015   Procedure: BILATERAL KNEE CLOSED MANIPULATION;  Surgeon: Jamie Arabian, MD;  Location: [facility];  Service: Orthopedics;  Laterality: Bilateral;  . TOTAL KNEE ARTHROPLASTY Bilateral 03/07/2015   Procedure: BILATERAL TOTAL KNEE ARTHROPLASTY;  Surgeon: Jamie Arabian, MD;  Location: [facility];  Service: Orthopedics;  Laterality: Bilateral;  +epidural    FAMILY HISTORY:  Reviewed and unchanged. No reported history of malignancy or chronic disease.     ADVANCED DIRECTIVES:    HEALTH MAINTENANCE: Social History  Substance Use Topics  . Smoking status: Former Smoker    Quit date: 05/24/2012  . Smokeless tobacco: Never Used  . Alcohol use No     Allergies  Allergen Reactions  . Tramadol Nausea Only and Nausea And Vomiting    Current Outpatient Prescriptions  Medication Sig Dispense Refill  . acetaminophen (TYLENOL) 500 MG tablet Take 500 mg by mouth every 6 (six) hours as needed (Pain).    Marland Kitchen letrozole (FEMARA) 2.5 MG tablet Take 1 tablet (2.5 mg total) by mouth daily. 30 tablet 1  . levothyroxine (SYNTHROID, LEVOTHROID) 175 MCG  tablet Take 175 mcg by mouth daily before breakfast.     No current facility-administered medications for this visit.     OBJECTIVE: Vitals:   10/01/16 1435  BP: 139/88  Pulse: 90  Resp: 18  Temp: 98.3 F (36.8 C)     Body mass index is 30.99 kg/m.    ECOG FS:0 - Asymptomatic  General: Well-developed, well-nourished, no acute distress. Eyes: Pink conjunctiva, anicteric sclera. Breasts: Patient has requested exam be  deferred today.  Lungs: Clear to auscultation bilaterally. Heart: Regular rate and rhythm. No rubs, murmurs, or gallops. Abdomen: Soft, nontender, nondistended. No organomegaly noted, normoactive bowel sounds. Musculoskeletal: No edema, cyanosis, or clubbing. Neuro: Alert, answering all questions appropriately. Cranial nerves grossly intact. Skin: No rashes or petechiae noted. Psych: Normal affect.   LAB RESULTS:  Lab Results  Component Value Date   NA 138 03/06/2016   K 3.0 (L) 03/06/2016   CL 103 03/06/2016   CO2 26 03/06/2016   GLUCOSE 94 03/06/2016   BUN 10 03/06/2016   CREATININE 0.56 03/06/2016   CALCIUM 8.9 03/06/2016   PROT 5.5 (L) 03/12/2015   ALBUMIN 2.6 (L) 03/12/2015   AST 19 03/12/2015   ALT 15 03/12/2015   ALKPHOS 59 03/12/2015   BILITOT 0.7 03/12/2015   GFRNONAA >60 03/06/2016   GFRAA >60 03/06/2016    Lab Results  Component Value Date   WBC 5.7 03/06/2016   NEUTROABS 3.5 03/06/2016   HGB 13.3 03/06/2016   HCT 38.7 03/06/2016   MCV 89.8 03/06/2016   PLT 189 03/06/2016   Lab Results  Component Value Date   LABCA2 23.6 03/06/2016     STUDIES: Ct Abdomen Pelvis W Contrast  Result Date: 09/22/2016 CLINICAL DATA:  Colon cancer follow up. History of left breast cancer. Restaging. Colon resection. EXAM: CT ABDOMEN AND PELVIS WITH CONTRAST TECHNIQUE: Multidetector CT imaging of the abdomen and pelvis was performed using the standard protocol following bolus administration of intravenous contrast. CONTRAST:  145mL ISOVUE-300 IOPAMIDOL (ISOVUE-300) INJECTION 61% COMPARISON:  03/06/2016 FINDINGS: Lower chest: Small type 1 hiatal hernia. Hepatobiliary: Multiple small gallstones subtle dependently in the gallbladder. No liver mass identified. Pancreas: Unremarkable Spleen: Unremarkable Adrenals/Urinary Tract: Mild right hydroureter extending down to the iliac vessel cross over but not beyond. No hydronephrosis or calculi. Cystocele noted due to pelvic floor laxity.  Adrenal glands normal. Stomach/Bowel: No recurrence along the sigmoid anastomotic site. Vascular/Lymphatic: Aortoiliac atherosclerotic vascular disease. No pathologic adenopathy. Serpentine retroperitoneal portosystemic shunting the attaching to the splenic vein. Reproductive: Uterus absent. Ovaries not seen. Low position of the vagina due to pelvic floor laxity. Other: No supplemental non-categorized findings. Musculoskeletal: Pelvic floor laxity with cystocele, low position of the vagina below the pubococcygeal line, and low position of the anorectal junction. IMPRESSION: 1. No recurrence identified. 2. Pelvic floor laxity with cystocele, low position of the vagina below the pubococcygeal line, and low position of the anorectal junction. 3. Other imaging findings of potential clinical significance: Small type 1 hiatal hernia. Cholelithiasis. Small segment of right hydroureter without overt obstruction. Aortoiliac atherosclerotic vascular disease. Electronically Signed   By: Van Clines M.D.   On: 09/22/2016 13:53    ASSESSMENT: Stage Ia adenocarcinoma of the upper outer quadrant of the left breast, stage IIa adenocarcinoma of the sigmoid colon with positive margins.  PLAN:    1.  Stage Ia adenocarcinoma of the upper outer quadrant of the left breast: No evidence of disease. CA 27.29 continues to be within normal limits.  Her most  recent mammogram on March 21, 2016 was reported as BI-RADS 2. Continue letrozole completing 5 years of treatment in August of 2020.  Bone mineral density on March 14, 2014 was reported as -1.0 which is within normal limits. Patient has missed several appointments for a repeat bone mineral density. 2.  Stage IIa adenocarcinoma of the sigmoid colon with positive margins:  Although patient's stage IIa, she was noted to have positive margins on her colectomy specimen on December 09, 2012. Because of this, she received 12 cycles of adjuvant FOLFOX completing in approximately  July 2014. CT scan on September 22, 2016 reviewed independently and reported no evidence of recurrence. Because of her positive margins, she is at high risk for recurrence and will repeat CT scan every 6 months. CEA continues to be within normal limits.  Colonoscopy in December 2014 removed 2 polyps, but otherwise was within normal limits.  Repeat colonoscopy in December 2017.  3. Disposition: Return to clinic in 6 months with repeat CT scan and laboratory work. If CT scan and colonoscopy later this year are reported within normal limits, can consider discontinuing imaging.   Patient expressed understanding and was in agreement with this plan. She also understands that She can call clinic at any time with any questions, concerns, or complaints.    Lloyd Huger, MD   10/05/2016 5:03 PM

## 2016-10-01 ENCOUNTER — Inpatient Hospital Stay: Payer: PRIVATE HEALTH INSURANCE | Attending: Oncology | Admitting: Oncology

## 2016-10-01 VITALS — BP 139/88 | HR 90 | Temp 98.3°F | Resp 18 | Wt 203.8 lb

## 2016-10-01 DIAGNOSIS — Z9221 Personal history of antineoplastic chemotherapy: Secondary | ICD-10-CM | POA: Diagnosis not present

## 2016-10-01 DIAGNOSIS — Z170 Estrogen receptor positive status [ER+]: Secondary | ICD-10-CM | POA: Insufficient documentation

## 2016-10-01 DIAGNOSIS — Z923 Personal history of irradiation: Secondary | ICD-10-CM | POA: Diagnosis not present

## 2016-10-01 DIAGNOSIS — C772 Secondary and unspecified malignant neoplasm of intra-abdominal lymph nodes: Secondary | ICD-10-CM

## 2016-10-01 DIAGNOSIS — Z79899 Other long term (current) drug therapy: Secondary | ICD-10-CM | POA: Insufficient documentation

## 2016-10-01 DIAGNOSIS — M1990 Unspecified osteoarthritis, unspecified site: Secondary | ICD-10-CM | POA: Diagnosis not present

## 2016-10-01 DIAGNOSIS — E039 Hypothyroidism, unspecified: Secondary | ICD-10-CM | POA: Diagnosis not present

## 2016-10-01 DIAGNOSIS — C50412 Malignant neoplasm of upper-outer quadrant of left female breast: Secondary | ICD-10-CM | POA: Diagnosis not present

## 2016-10-01 DIAGNOSIS — R232 Flushing: Secondary | ICD-10-CM | POA: Insufficient documentation

## 2016-10-01 DIAGNOSIS — Z9049 Acquired absence of other specified parts of digestive tract: Secondary | ICD-10-CM | POA: Diagnosis not present

## 2016-10-01 DIAGNOSIS — C187 Malignant neoplasm of sigmoid colon: Secondary | ICD-10-CM

## 2016-10-01 DIAGNOSIS — Z79811 Long term (current) use of aromatase inhibitors: Secondary | ICD-10-CM | POA: Diagnosis not present

## 2016-10-01 DIAGNOSIS — Z85038 Personal history of other malignant neoplasm of large intestine: Secondary | ICD-10-CM | POA: Diagnosis not present

## 2016-10-01 DIAGNOSIS — Z87891 Personal history of nicotine dependence: Secondary | ICD-10-CM | POA: Insufficient documentation

## 2016-10-01 DIAGNOSIS — Z86718 Personal history of other venous thrombosis and embolism: Secondary | ICD-10-CM

## 2016-10-01 NOTE — Progress Notes (Signed)
States is feeling well. Offers no complaints. Has intermittent hot flashes with femara but states is tolerable.

## 2016-10-02 ENCOUNTER — Telehealth: Payer: Self-pay | Admitting: Gastroenterology

## 2016-10-02 NOTE — Telephone Encounter (Signed)
Follow up colonoscopy from [place]

## 2016-10-08 ENCOUNTER — Inpatient Hospital Stay: Payer: PRIVATE HEALTH INSURANCE

## 2016-10-08 DIAGNOSIS — C772 Secondary and unspecified malignant neoplasm of intra-abdominal lymph nodes: Secondary | ICD-10-CM

## 2016-10-08 DIAGNOSIS — C50412 Malignant neoplasm of upper-outer quadrant of left female breast: Secondary | ICD-10-CM | POA: Diagnosis not present

## 2016-10-08 DIAGNOSIS — C187 Malignant neoplasm of sigmoid colon: Secondary | ICD-10-CM

## 2016-10-08 LAB — CBC WITH DIFFERENTIAL/PLATELET
BASOS ABS: 0 10*3/uL (ref 0–0.1)
BASOS PCT: 1 %
EOS ABS: 0.1 10*3/uL (ref 0–0.7)
Eosinophils Relative: 1 %
HCT: 40 % (ref 35.0–47.0)
HEMOGLOBIN: 13.8 g/dL (ref 12.0–16.0)
Lymphocytes Relative: 27 %
Lymphs Abs: 1.7 10*3/uL (ref 1.0–3.6)
MCH: 30.4 pg (ref 26.0–34.0)
MCHC: 34.4 g/dL (ref 32.0–36.0)
MCV: 88.5 fL (ref 80.0–100.0)
Monocytes Absolute: 0.3 10*3/uL (ref 0.2–0.9)
Monocytes Relative: 5 %
NEUTROS PCT: 66 %
Neutro Abs: 4 10*3/uL (ref 1.4–6.5)
Platelets: 195 10*3/uL (ref 150–440)
RBC: 4.52 MIL/uL (ref 3.80–5.20)
RDW: 12.9 % (ref 11.5–14.5)
WBC: 6.1 10*3/uL (ref 3.6–11.0)

## 2016-10-08 LAB — BASIC METABOLIC PANEL
Anion gap: 11 (ref 5–15)
BUN: 17 mg/dL (ref 6–20)
CALCIUM: 9.4 mg/dL (ref 8.9–10.3)
CHLORIDE: 103 mmol/L (ref 101–111)
CO2: 25 mmol/L (ref 22–32)
CREATININE: 0.54 mg/dL (ref 0.44–1.00)
GFR calc non Af Amer: >60 mL/min (ref >60)
Glucose, Bld: 105 mg/dL — ABNORMAL HIGH (ref 65–99)
Potassium: 4 mmol/L (ref 3.5–5.1)
SODIUM: 139 mmol/L (ref 135–145)

## 2016-10-09 LAB — CANCER ANTIGEN 27.29: CA 27.29: 19.9 U/mL (ref 0.0–38.6)

## 2016-10-09 LAB — CEA: CEA: 0.9 ng/mL (ref 0.0–4.7)

## 2016-10-09 NOTE — Telephone Encounter (Signed)
Called patient. Unable to leave voice mail  Contacted [employer] and pt could not be verified. Check on her insurance.   Diag. [id number] Personal history of other malignant neoplasm of large intestine

## 2016-10-15 NOTE — Telephone Encounter (Signed)
Unable to contact patient. Sent notification letter.

## 2016-10-16 ENCOUNTER — Other Ambulatory Visit: Payer: Self-pay | Admitting: *Deleted

## 2016-10-16 MED ORDER — LETROZOLE 2.5 MG PO TABS: 2.5000 mg | ORAL_TABLET | Freq: Every day | ORAL | 1 refills | Status: DC

## 2016-10-20 ENCOUNTER — Other Ambulatory Visit: Payer: Self-pay

## 2016-10-20 DIAGNOSIS — Z85038 Personal history of other malignant neoplasm of large intestine: Secondary | ICD-10-CM

## 2016-10-20 DIAGNOSIS — E079 Disorder of thyroid, unspecified: Secondary | ICD-10-CM | POA: Insufficient documentation

## 2016-10-20 NOTE — Telephone Encounter (Signed)
Gastroenterology Pre-Procedure Review  Request Date: 11/21/16 Requesting Physician: Dr. Allen Norris  PATIENT REVIEW QUESTIONS: The patient responded to the following health history questions as indicated:    1. Are you having any GI issues? no 2. Do you have a personal history of Polyps? no 3. Do you have a family history of Colon Cancer or Polyps? no 4. Diabetes Mellitus? no 5. Joint replacements in the past 12 months? No 6. Major health problems in the past 3 months?no 7. Any artificial heart valves, MVP, or defibrillator?no    MEDICATIONS & ALLERGIES:    Patient reports the following regarding taking any anticoagulation/antiplatelet therapy:   Plavix, Coumadin, Eliquis, Xarelto, Lovenox, Pradaxa, Brilinta, or Effient? no Aspirin? no  Patient confirms/reports the following medications:  Current Outpatient Prescriptions  Medication Sig Dispense Refill  . acetaminophen (TYLENOL) 500 MG tablet Take 500 mg by mouth every 6 (six) hours as needed (Pain).    Marland Kitchen letrozole (FEMARA) 2.5 MG tablet Take 1 tablet (2.5 mg total) by mouth daily. 30 tablet 1  . levothyroxine (SYNTHROID, LEVOTHROID) 175 MCG tablet Take 175 mcg by mouth daily before breakfast.     No current facility-administered medications for this visit.     Patient confirms/reports the following allergies:  Allergies  Allergen Reactions  . Tramadol Nausea Only and Nausea And Vomiting    No orders of the defined types were placed in this encounter.   AUTHORIZATION INFORMATION Primary Insurance: 1D#: Group #:  Secondary Insurance: 1D#: Group #:  SCHEDULE INFORMATION: Date: 11/21/16 Time: Location: [place]

## 2016-10-20 NOTE — Telephone Encounter (Signed)
Patient returned your call regarding a colonoscopy

## 2016-10-20 NOTE — Telephone Encounter (Signed)
Orders placed at this time.   Medication sent to Pharmacy.  Information sent to patient via mail after confirming address with patient.

## 2016-10-21 ENCOUNTER — Telehealth: Payer: Self-pay | Admitting: *Deleted

## 2016-10-21 NOTE — Telephone Encounter (Signed)
Called asking for lab results , I attempted to return call and the phone just kept ringing with no answer  LAB) Dx:  Primary cancer of upper outer quadran...    Ref Range & Units 13d ago 59mo ago 78yr ago   CA 27.29 0.0 - 38.6 U/mL 19.9  23.6CM  25.6CM   Comments: (NOTE)  [employer]/ACS methodology  Performed At: [facility]  [place], [place] [id number]  Lindon Romp MD [id number]   Resulting Agency  [facility] [id number]    Specimen Collected: 10/08/16 10:40 Last Resulted: 10/09/16 04:34            CM=Additional comments        Other Results from 10/08/2016   CEA  Order: [id number]   Status:  Final result Visible to patient:  No (Not Released) Next appt:  04/01/2017 at 10:00 AM in Oncology ([facility]) Dx:  Cancer of sigmoid colon metastatic to...    Ref Range & Units 13d ago 45mo ago 6yr ago   CEA 0.0 - 4.7 ng/mL 0.9  1.1CM  0.7CM   Comments: (NOTE)     Roche ECLIA methodology    Nonsmokers <3.9                    Smokers   <5.6

## 2016-11-11 ENCOUNTER — Encounter: Payer: Self-pay | Admitting: *Deleted

## 2016-11-19 ENCOUNTER — Telehealth: Payer: Self-pay | Admitting: Gastroenterology

## 2016-11-19 ENCOUNTER — Other Ambulatory Visit: Payer: Self-pay

## 2016-11-19 DIAGNOSIS — Z1211 Encounter for screening for malignant neoplasm of colon: Secondary | ICD-10-CM

## 2016-11-19 MED ORDER — PEG 3350-KCL-NABCB-NACL-NASULF 236 G PO SOLR: ORAL | 0 refills | Status: DC

## 2016-11-19 NOTE — Telephone Encounter (Signed)
[facility] [address] per pt request.

## 2016-11-19 NOTE — Telephone Encounter (Signed)
Patient needs a RX for colonoscopy. It wasn't in her packet. [facility] [place] Please call her when it's called in.

## 2016-11-21 ENCOUNTER — Ambulatory Visit
Admission: RE | Admit: 2016-11-21 | Discharge: 2016-11-21 | Disposition: A | Discharge status: Home / Self Care | Payer: PRIVATE HEALTH INSURANCE | Source: Ambulatory Visit | Attending: Gastroenterology | Admitting: Gastroenterology

## 2016-11-21 ENCOUNTER — Ambulatory Visit: Payer: PRIVATE HEALTH INSURANCE | Admitting: Anesthesiology

## 2016-11-21 ENCOUNTER — Encounter
Admission: RE | Disposition: A | Discharge status: Home / Self Care | Payer: Self-pay | Source: Ambulatory Visit | Attending: Gastroenterology

## 2016-11-21 ENCOUNTER — Other Ambulatory Visit: Payer: Self-pay

## 2016-11-21 DIAGNOSIS — Z538 Procedure and treatment not carried out for other reasons: Secondary | ICD-10-CM

## 2016-11-21 DIAGNOSIS — Z85038 Personal history of other malignant neoplasm of large intestine: Secondary | ICD-10-CM | POA: Insufficient documentation

## 2016-11-21 DIAGNOSIS — Z9221 Personal history of antineoplastic chemotherapy: Secondary | ICD-10-CM | POA: Diagnosis not present

## 2016-11-21 DIAGNOSIS — E039 Hypothyroidism, unspecified: Secondary | ICD-10-CM | POA: Insufficient documentation

## 2016-11-21 DIAGNOSIS — Z79899 Other long term (current) drug therapy: Secondary | ICD-10-CM | POA: Insufficient documentation

## 2016-11-21 DIAGNOSIS — Z923 Personal history of irradiation: Secondary | ICD-10-CM | POA: Insufficient documentation

## 2016-11-21 DIAGNOSIS — Z87891 Personal history of nicotine dependence: Secondary | ICD-10-CM | POA: Insufficient documentation

## 2016-11-21 DIAGNOSIS — Z79811 Long term (current) use of aromatase inhibitors: Secondary | ICD-10-CM | POA: Diagnosis not present

## 2016-11-21 DIAGNOSIS — Z853 Personal history of malignant neoplasm of breast: Secondary | ICD-10-CM | POA: Insufficient documentation

## 2016-11-21 DIAGNOSIS — Z1211 Encounter for screening for malignant neoplasm of colon: Secondary | ICD-10-CM | POA: Diagnosis present

## 2016-11-21 DIAGNOSIS — Z96653 Presence of artificial knee joint, bilateral: Secondary | ICD-10-CM | POA: Insufficient documentation

## 2016-11-21 DIAGNOSIS — Z9049 Acquired absence of other specified parts of digestive tract: Secondary | ICD-10-CM | POA: Insufficient documentation

## 2016-11-21 DIAGNOSIS — Z86718 Personal history of other venous thrombosis and embolism: Secondary | ICD-10-CM | POA: Insufficient documentation

## 2016-11-21 HISTORY — DX: Headache, unspecified: R51.9

## 2016-11-21 HISTORY — PX: COLONOSCOPY WITH PROPOFOL: SHX5780

## 2016-11-21 HISTORY — DX: Presence of dental prosthetic device (complete) (partial): Z97.2

## 2016-11-21 HISTORY — DX: Headache: R51

## 2016-11-21 SURGERY — COLONOSCOPY WITH PROPOFOL
Anesthesia: Monitor Anesthesia Care | Wound class: Contaminated

## 2016-11-21 MED ORDER — PROPOFOL 10 MG/ML IV BOLUS
INTRAVENOUS | Status: DC | PRN
  Administered 2016-11-21: 10 mg via INTRAVENOUS
  Administered 2016-11-21: 30 mg via INTRAVENOUS
  Administered 2016-11-21: 70 mg via INTRAVENOUS

## 2016-11-21 MED ORDER — PEG 3350-KCL-NABCB-NACL-NASULF 236 G PO SOLR: ORAL | 0 refills | Status: DC

## 2016-11-21 MED ORDER — LIDOCAINE HCL (CARDIAC) 20 MG/ML IV SOLN
INTRAVENOUS | Status: DC | PRN
  Administered 2016-11-21: 40 mg via INTRAVENOUS

## 2016-11-21 MED ORDER — LACTATED RINGERS IV SOLN
INTRAVENOUS | Status: DC
  Administered 2016-11-21: 08:00:00 via INTRAVENOUS

## 2016-11-21 MED ORDER — STERILE WATER FOR IRRIGATION IR SOLN
Status: DC | PRN
  Administered 2016-11-21: 09:00:00

## 2016-11-21 SURGICAL SUPPLY — 23 items

## 2016-11-21 NOTE — Anesthesia Procedure Notes (Signed)
Procedure Name: MAC Performed by: Alberto Schoch Pre-anesthesia Checklist: Patient identified, Emergency Drugs available, Suction available, Timeout performed and Patient being monitored Patient Re-evaluated:Patient Re-evaluated prior to inductionOxygen Delivery Method: Nasal cannula Placement Confirmation: positive ETCO2       

## 2016-11-21 NOTE — Anesthesia Preprocedure Evaluation (Signed)
Anesthesia Evaluation  Patient identified by MRN, date of birth, ID band Patient awake    Reviewed: Allergy & Precautions, H&P , NPO status , Patient's Chart, lab work & pertinent test results  Airway Mallampati: II  TM Distance: >3 FB Neck ROM: full    Dental  (+) Edentulous Lower, Upper Dentures   Pulmonary former smoker,    Pulmonary exam normal        Cardiovascular Normal cardiovascular exam     Neuro/Psych    GI/Hepatic   Endo/Other  Hypothyroidism   Renal/GU      Musculoskeletal   Abdominal   Peds  Hematology   Anesthesia Other Findings   Reproductive/Obstetrics                             Anesthesia Physical Anesthesia Plan  ASA: II  Anesthesia Plan: MAC   Post-op Pain Management:    Induction:   Airway Management Planned:   Additional Equipment:   Intra-op Plan:   Post-operative Plan:   Informed Consent: I have reviewed the patients History and Physical, chart, labs and discussed the procedure including the risks, benefits and alternatives for the proposed anesthesia with the patient or authorized representative who has indicated his/her understanding and acceptance.     Plan Discussed with:   Anesthesia Plan Comments:         Anesthesia Quick Evaluation

## 2016-11-21 NOTE — H&P (Signed)
Lucilla Lame, MD [facility] [address], [place], [place] [address] Phone: [phone] Fax : [phone]  Primary Care Physician:  Lucilla Lame, MD Primary Gastroenterologist:  Dr. Allen Norris  Pre-Procedure History & Physical: HPI:  Jamie Martinez is a 59 y.o. female is here for an colonoscopy.   Past Medical History:  Diagnosis Date  . Arthritis   . Cancer [facility]) 2010   left breast surgery and radiation ,  dec 2013 colon chemo done for colon  . Colon cancer [facility])    colon resection   . DVT of leg (deep venous thrombosis) (HCC) 02-2015   Left leg  . Headache    sinus  . Hypothyroidism   . Wears dentures    full upper    Past Surgical History:  Procedure Laterality Date  . ABDOMINAL HYSTERECTOMY     partial  . APPENDECTOMY  dec 2013  . BREAST BIOPSY Left 2010   +  . BREAST SURGERY Left 2010   lumpectomy done, lymph nodes removed   . COLON SURGERY  dec 2013   surgery for colon cancer, part of bladder, part of pelvic wall removed appendix removed 1 ovary removed   . knee arthroscopy Bilateral   . KNEE CLOSED REDUCTION Bilateral 04/23/2015   Procedure: BILATERAL KNEE CLOSED MANIPULATION;  Surgeon: Gaynelle Arabian, MD;  Location: [facility];  Service: Orthopedics;  Laterality: Bilateral;  . TOTAL KNEE ARTHROPLASTY Bilateral 03/07/2015   Procedure: BILATERAL TOTAL KNEE ARTHROPLASTY;  Surgeon: Gaynelle Arabian, MD;  Location: [facility];  Service: Orthopedics;  Laterality: Bilateral;  +epidural    Prior to Admission medications   Medication Sig Start Date End Date Taking? Authorizing Provider  acetaminophen (TYLENOL) 500 MG tablet Take 500 mg by mouth every 6 (six) hours as needed (Pain).   Yes Historical Provider, MD  letrozole (FEMARA) 2.5 MG tablet Take 1 tablet (2.5 mg total) by mouth daily. 10/16/16  Yes Lloyd Huger, MD  levothyroxine (SYNTHROID, LEVOTHROID) 175 MCG tablet Take 175 mcg by mouth daily before breakfast.   Yes Historical Provider, MD  loratadine (CLARITIN) 10 MG  tablet Take 10 mg by mouth daily.   Yes Historical Provider, MD  polyethylene glycol (GOLYTELY) 236 g solution Drink one 8 oz glass every 20 mins until stools are clear. 11/19/16  Yes Lucilla Lame, MD    Allergies as of 10/20/2016 - Review Complete 10/01/2016  Allergen Reaction Noted  . Tramadol Nausea Only and Nausea And Vomiting 09/06/2015    Family History  Problem Relation Age of Onset  . Pancreatic cancer Mother 8  . Lung cancer Father 79    Social History   Social History  . Marital status: Married    Spouse name: N/A  . Number of children: N/A  . Years of education: N/A   Occupational History  . Not on file.   Social History Main Topics  . Smoking status: Former Smoker    Quit date: 05/24/2012  . Smokeless tobacco: Never Used  . Alcohol use No  . Drug use: No  . Sexual activity: Not on file   Other Topics Concern  . Not on file   Social History Narrative  . No narrative on file    Review of Systems: See HPI, otherwise negative ROS  Physical Exam: BP 140/85   Pulse 77   Temp 97.8 F (36.6 C) (Temporal)   Resp 16   Ht 5\' 9"  (1.753 m)   Wt 210 lb (95.3 kg)   SpO2 98%  BMI 31.01 kg/m  General:   Alert,  pleasant and cooperative in NAD Head:  Normocephalic and atraumatic. Neck:  Supple; no masses or thyromegaly. Lungs:  Clear throughout to auscultation.    Heart:  Regular rate and rhythm. Abdomen:  Soft, nontender and nondistended. Normal bowel sounds, without guarding, and without rebound.   Neurologic:  Alert and  oriented x4;  grossly normal neurologically.  Impression/Plan: Jamie Martinez is here for an colonoscopy to be performed for history of colon cancer   Risks, benefits, limitations, and alternatives regarding  colonoscopy have been reviewed with the patient.  Questions have been answered.  All parties agreeable.   Lucilla Lame, MD  11/21/2016, 8:32 AM

## 2016-11-21 NOTE — Op Note (Signed)
[facility] Gastroenterology Patient Name: Jamie Martinez Procedure Date: 11/21/2016 9:02 AM MRN: [id number] Account #: [id number] Date of Birth: 1956/05/16 Admit Type: Outpatient Age: 60 Room: [facility] OR ROOM 01 Gender: Female Note Status: Finalized Procedure:            Colonoscopy Indications:          High risk colon cancer surveillance: Personal history                        of colon cancer Providers:            Lucilla Lame MD, MD Referring MD:         Lucilla Lame MD, MD (Referring MD) Medicines:            Propofol per Anesthesia Complications:        No immediate complications. Procedure:            Pre-Anesthesia Assessment:                       - Prior to the procedure, a History and Physical was                        performed, and patient medications and allergies were                        reviewed. The patient's tolerance of previous                        anesthesia was also reviewed. The risks and benefits of                        the procedure and the sedation options and risks were                        discussed with the patient. All questions were                        answered, and informed consent was obtained. Prior                        Anticoagulants: The patient has taken no previous                        anticoagulant or antiplatelet agents. ASA Grade                        Assessment: II - A patient with mild systemic disease.                        After reviewing the risks and benefits, the patient was                        deemed in satisfactory condition to undergo the                        procedure.                       After obtaining informed consent, the colonoscope was  passed under direct vision. Throughout the procedure,                        the patient's blood pressure, pulse, and oxygen                        saturations were monitored continuously. The Olympus CF   H180AL colonoscope (S#: [id number]) was introduced through                        the anus and advanced to the the sigmoid colon. The                        colonoscopy was performed without difficulty. The                        colonoscopy was aborted due to poor bowel prep with                        stool present. Findings:      The perianal and digital rectal examinations were normal.      A large amount of stool was found in the rectum, in the recto-sigmoid       colon and in the sigmoid colon, precluding visualization. Impression:           - The procedure was aborted due to poor bowel prep with                        stool present.                       - Stool in the rectum, in the recto-sigmoid colon and                        in the sigmoid colon.                       - No specimens collected. Recommendation:       - Discharge patient to home.                       - Resume previous diet.                       - Repeat colonoscopy because the bowel preparation was                        poor. Procedure Code(s):    --- Professional ---                       [phone], 32, Colonoscopy, flexible; diagnostic, including                        collection of specimen(s) by brushing or washing, when                        performed (separate procedure) Diagnosis Code(s):    --- Professional ---                       [id number], Personal history of other malignant neoplasm  of large intestine                       Z53.8, Procedure and treatment not carried out for                        other reasons CPT copyright 2016 American Medical Association. All rights reserved. The codes documented in this report are preliminary and upon coder review may  be revised to meet current compliance requirements. Lucilla Lame MD, MD 11/21/2016 9:25:12 AM This report has been signed electronically. Number of Addenda: 0 Note Initiated On: 11/21/2016 9:02 AM Total Procedure Duration: 0  hours 2 minutes 47 seconds       [facility]

## 2016-11-21 NOTE — Transfer of Care (Signed)
Immediate Anesthesia Transfer of Care Note  Patient: Jamie Martinez  Procedure(s) Performed: Procedure(s): COLONOSCOPY WITH PROPOFOL (N/A)  Patient Location: PACU  Anesthesia Type: MAC  Level of Consciousness: awake, alert  and patient cooperative  Airway and Oxygen Therapy: Patient Spontanous Breathing and Patient connected to supplemental oxygen  Post-op Assessment: Post-op Vital signs reviewed, Patient's Cardiovascular Status Stable, Respiratory Function Stable, Patent Airway and No signs of Nausea or vomiting  Post-op Vital Signs: Reviewed and stable  Complications: No apparent anesthesia complications

## 2016-11-21 NOTE — Discharge Instructions (Signed)

## 2016-11-21 NOTE — Anesthesia Postprocedure Evaluation (Signed)
Anesthesia Post Note  Patient: Jamie Martinez  Procedure(s) Performed: Procedure(s) (LRB): COLONOSCOPY WITH PROPOFOL (N/A)  Patient location during evaluation: PACU Anesthesia Type: MAC Level of consciousness: awake and alert and oriented Pain management: satisfactory to patient Vital Signs Assessment: post-procedure vital signs reviewed and stable Respiratory status: spontaneous breathing, nonlabored ventilation and respiratory function stable Cardiovascular status: blood pressure returned to baseline and stable Postop Assessment: Adequate PO intake and No signs of nausea or vomiting Anesthetic complications: no    Raliegh Ip

## 2016-11-24 ENCOUNTER — Encounter: Payer: Self-pay | Admitting: Gastroenterology

## 2016-11-27 NOTE — Discharge Instructions (Signed)

## 2016-11-28 ENCOUNTER — Ambulatory Visit: Payer: PRIVATE HEALTH INSURANCE | Admitting: Anesthesiology

## 2016-11-28 ENCOUNTER — Ambulatory Visit
Admission: RE | Admit: 2016-11-28 | Discharge: 2016-11-28 | Disposition: A | Discharge status: Home / Self Care | Payer: PRIVATE HEALTH INSURANCE | Source: Ambulatory Visit | Attending: Gastroenterology | Admitting: Gastroenterology

## 2016-11-28 ENCOUNTER — Encounter
Admission: RE | Disposition: A | Discharge status: Home / Self Care | Payer: Self-pay | Source: Ambulatory Visit | Attending: Gastroenterology

## 2016-11-28 DIAGNOSIS — K641 Second degree hemorrhoids: Secondary | ICD-10-CM | POA: Diagnosis not present

## 2016-11-28 DIAGNOSIS — Z9221 Personal history of antineoplastic chemotherapy: Secondary | ICD-10-CM | POA: Insufficient documentation

## 2016-11-28 DIAGNOSIS — Z96653 Presence of artificial knee joint, bilateral: Secondary | ICD-10-CM | POA: Diagnosis not present

## 2016-11-28 DIAGNOSIS — D124 Benign neoplasm of descending colon: Secondary | ICD-10-CM | POA: Diagnosis not present

## 2016-11-28 DIAGNOSIS — K621 Rectal polyp: Secondary | ICD-10-CM

## 2016-11-28 DIAGNOSIS — Z980 Intestinal bypass and anastomosis status: Secondary | ICD-10-CM | POA: Insufficient documentation

## 2016-11-28 DIAGNOSIS — E039 Hypothyroidism, unspecified: Secondary | ICD-10-CM | POA: Insufficient documentation

## 2016-11-28 DIAGNOSIS — Z86718 Personal history of other venous thrombosis and embolism: Secondary | ICD-10-CM | POA: Diagnosis not present

## 2016-11-28 DIAGNOSIS — Z85038 Personal history of other malignant neoplasm of large intestine: Secondary | ICD-10-CM

## 2016-11-28 DIAGNOSIS — D122 Benign neoplasm of ascending colon: Secondary | ICD-10-CM | POA: Diagnosis not present

## 2016-11-28 DIAGNOSIS — Z79811 Long term (current) use of aromatase inhibitors: Secondary | ICD-10-CM | POA: Insufficient documentation

## 2016-11-28 DIAGNOSIS — Z87891 Personal history of nicotine dependence: Secondary | ICD-10-CM | POA: Diagnosis not present

## 2016-11-28 DIAGNOSIS — Z923 Personal history of irradiation: Secondary | ICD-10-CM | POA: Diagnosis not present

## 2016-11-28 DIAGNOSIS — Z1211 Encounter for screening for malignant neoplasm of colon: Secondary | ICD-10-CM | POA: Diagnosis present

## 2016-11-28 HISTORY — PX: POLYPECTOMY: SHX5525

## 2016-11-28 HISTORY — PX: COLONOSCOPY WITH PROPOFOL: SHX5780

## 2016-11-28 SURGERY — COLONOSCOPY WITH PROPOFOL
Anesthesia: Monitor Anesthesia Care | Wound class: Contaminated

## 2016-11-28 MED ORDER — ACETAMINOPHEN 325 MG PO TABS: 325.0000 mg | ORAL_TABLET | ORAL | Status: DC | PRN

## 2016-11-28 MED ORDER — LACTATED RINGERS IV SOLN
INTRAVENOUS | Status: DC | PRN
  Administered 2016-11-28: 11:00:00 via INTRAVENOUS

## 2016-11-28 MED ORDER — ACETAMINOPHEN 160 MG/5ML PO SOLN: 325.0000 mg | ORAL | Status: DC | PRN

## 2016-11-28 MED ORDER — LIDOCAINE HCL (CARDIAC) 20 MG/ML IV SOLN
INTRAVENOUS | Status: DC | PRN
  Administered 2016-11-28: 40 mg via INTRAVENOUS

## 2016-11-28 MED ORDER — LACTATED RINGERS IV SOLN: INTRAVENOUS | Status: DC

## 2016-11-28 MED ORDER — PROPOFOL 10 MG/ML IV BOLUS
INTRAVENOUS | Status: DC | PRN
  Administered 2016-11-28: 10 mg via INTRAVENOUS
  Administered 2016-11-28: 70 mg via INTRAVENOUS
  Administered 2016-11-28 (×2): 20 mg via INTRAVENOUS
  Administered 2016-11-28: 50 mg via INTRAVENOUS
  Administered 2016-11-28: 10 mg via INTRAVENOUS
  Administered 2016-11-28: 20 mg via INTRAVENOUS

## 2016-11-28 MED ORDER — STERILE WATER FOR IRRIGATION IR SOLN
Status: DC | PRN
  Administered 2016-11-28: 11:00:00

## 2016-11-28 SURGICAL SUPPLY — 23 items

## 2016-11-28 NOTE — Anesthesia Preprocedure Evaluation (Signed)
Anesthesia Evaluation  Patient identified by MRN, date of birth, ID band Patient awake    Reviewed: Allergy & Precautions, H&P , NPO status , Patient's Chart, lab work & pertinent test results  Airway Mallampati: II  TM Distance: >3 FB Neck ROM: full    Dental  (+) Upper Dentures   Pulmonary former smoker,    Pulmonary exam normal        Cardiovascular Normal cardiovascular exam     Neuro/Psych    GI/Hepatic   Endo/Other  Hypothyroidism   Renal/GU      Musculoskeletal   Abdominal   Peds  Hematology   Anesthesia Other Findings   Reproductive/Obstetrics                             Anesthesia Physical Anesthesia Plan  ASA: II  Anesthesia Plan: MAC   Post-op Pain Management:    Induction:   Airway Management Planned:   Additional Equipment:   Intra-op Plan:   Post-operative Plan:   Informed Consent: I have reviewed the patients History and Physical, chart, labs and discussed the procedure including the risks, benefits and alternatives for the proposed anesthesia with the patient or authorized representative who has indicated his/her understanding and acceptance.     Plan Discussed with:   Anesthesia Plan Comments:         Anesthesia Quick Evaluation

## 2016-11-28 NOTE — H&P (Signed)
Lucilla Lame, MD [facility] [address], [place], [place] [address] Phone: [phone] Fax : [phone]  Primary Care Physician:  Lucilla Lame, MD Primary Gastroenterologist:  Dr. Allen Norris  Pre-Procedure History & Physical: HPI:  Jamie Martinez is a 60 y.o. female is here for an colonoscopy.   Past Medical History:  Diagnosis Date  . Arthritis   . Cancer [facility]) 2010   left breast surgery and radiation ,  dec 2013 colon chemo done for colon  . Colon cancer [facility])    colon resection   . DVT of leg (deep venous thrombosis) (HCC) 02-2015   Left leg  . Headache    sinus  . Hypothyroidism   . Wears dentures    full upper    Past Surgical History:  Procedure Laterality Date  . ABDOMINAL HYSTERECTOMY     partial  . APPENDECTOMY  dec 2013  . BREAST BIOPSY Left 2010   +  . BREAST SURGERY Left 2010   lumpectomy done, lymph nodes removed   . COLON SURGERY  dec 2013   surgery for colon cancer, part of bladder, part of pelvic wall removed appendix removed 1 ovary removed   . COLONOSCOPY WITH PROPOFOL N/A 11/21/2016   Procedure: COLONOSCOPY WITH PROPOFOL;  Surgeon: Lucilla Lame, MD;  Location: [place];  Service: Endoscopy;  Laterality: N/A;  . knee arthroscopy Bilateral   . KNEE CLOSED REDUCTION Bilateral 04/23/2015   Procedure: BILATERAL KNEE CLOSED MANIPULATION;  Surgeon: Gaynelle Arabian, MD;  [facility]: [facility];  Service: Orthopedics;  Laterality: Bilateral;  . TOTAL KNEE ARTHROPLASTY Bilateral 03/07/2015   Procedure: BILATERAL TOTAL KNEE ARTHROPLASTY;  Surgeon: Gaynelle Arabian, MD;  [facility];  Service: Orthopedics;  Laterality: Bilateral;  +epidural    Prior to Admission medications   Medication Sig Start Date End Date Taking? Authorizing Provider  acetaminophen (TYLENOL) 500 MG tablet Take 500 mg by mouth every 6 (six) hours as needed (Pain).   Yes Historical Provider, MD  letrozole (FEMARA) 2.5 MG tablet Take 1 tablet (2.5 mg total) by mouth daily. 10/16/16  Yes  Lloyd Huger, MD  levothyroxine (SYNTHROID, LEVOTHROID) 175 MCG tablet Take 175 mcg by mouth daily before breakfast.   Yes Historical Provider, MD  loratadine (CLARITIN) 10 MG tablet Take 10 mg by mouth daily.   Yes Historical Provider, MD  polyethylene glycol (GOLYTELY) 236 g solution Drink one 8 oz glass every 20 mins until stools are clear. 11/21/16   Lucilla Lame, MD    Allergies as of 11/21/2016 - Review Complete 11/21/2016  Allergen Reaction Noted  . Tramadol Nausea Only and Nausea And Vomiting 09/06/2015    Family History  Problem Relation Age of Onset  . Pancreatic cancer Mother 55  . Lung cancer Father 91    Social History   Social History  . Marital status: Married    Spouse name: N/A  . Number of children: N/A  . Years of education: N/A   Occupational History  . Not on file.   Social History Main Topics  . Smoking status: Former Smoker    Quit date: 05/24/2012  . Smokeless tobacco: Never Used  . Alcohol use No  . Drug use: No  . Sexual activity: Not on file   Other Topics Concern  . Not on file   Social History Narrative  . No narrative on file    Review of Systems: See HPI, otherwise negative ROS  Physical Exam: BP 134/90   Pulse 83  Temp 97 F (36.1 C) (Temporal)   Ht 5\' 9"  (1.753 m)   Wt 205 lb (93 kg)   SpO2 97%   BMI 30.27 kg/m  General:   Alert,  pleasant and cooperative in NAD Head:  Normocephalic and atraumatic. Neck:  Supple; no masses or thyromegaly. Lungs:  Clear throughout to auscultation.    Heart:  Regular rate and rhythm. Abdomen:  Soft, nontender and nondistended. Normal bowel sounds, without guarding, and without rebound.   Neurologic:  Alert and  oriented x4;  grossly normal neurologically.  Impression/Plan: Jamie Martinez is here for an colonoscopy to be performed for  history of colon cancer  Risks, benefits, limitations, and alternatives regarding  colonoscopy have been reviewed with the patient.  Questions have  been answered.  All parties agreeable.   Lucilla Lame, MD  11/28/2016, 10:05 AM

## 2016-11-28 NOTE — Op Note (Addendum)
[facility] Gastroenterology Patient Name: Jamie Martinez Procedure Date: 11/28/2016 10:41 AM MRN: [id number] Account #: [id number] Date of Birth: February 25, 1956 Admit Type: Outpatient Age: 60 Room: [facility] OR ROOM 01 Gender: Female Note Status: Finalized Procedure:            Colonoscopy Indications:          High risk colon cancer surveillance: Personal history                        of colon cancer Providers:            Lucilla Lame MD, MD Referring MD:         Irven Easterly. Kary Kos, MD (Referring MD) Medicines:            Propofol per Anesthesia Complications:        No immediate complications. Procedure:            Pre-Anesthesia Assessment:                       - Prior to the procedure, a History and Physical was                        performed, and patient medications and allergies were                        reviewed. The patient's tolerance of previous                        anesthesia was also reviewed. The risks and benefits of                        the procedure and the sedation options and risks were                        discussed with the patient. All questions were                        answered, and informed consent was obtained. Prior                        Anticoagulants: The patient has taken no previous                        anticoagulant or antiplatelet agents. ASA Grade                        Assessment: II - A patient with mild systemic disease.                        After reviewing the risks and benefits, the patient was                        deemed in satisfactory condition to undergo the                        procedure.                       After obtaining informed consent, the colonoscope was  passed under direct vision. Throughout the procedure,                        the patient's blood pressure, pulse, and oxygen                        saturations were monitored continuously. The was   introduced through the anus and advanced to the the                        cecum, identified by appendiceal orifice and ileocecal                        valve. The colonoscopy was performed without                        difficulty. The patient tolerated the procedure well.                        The quality of the bowel preparation was excellent. Findings:      The perianal and digital rectal examinations were normal.      A 3 mm polyp was found in the ascending colon. The polyp was sessile.       The polyp was removed with a cold biopsy forceps. Resection and       retrieval were complete.      A 4 mm polyp was found in the descending colon. The polyp was sessile.       The polyp was removed with a cold biopsy forceps. Resection and       retrieval were complete.      Two sessile polyps were found in the rectum. The polyps were 2 to 3 mm       in size. These polyps were removed with a cold biopsy forceps. Resection       and retrieval were complete.      Non-bleeding internal hemorrhoids were found during retroflexion. The       hemorrhoids were Grade II (internal hemorrhoids that prolapse but reduce       spontaneously).      There was evidence of a prior end-to-end colo-colonic anastomosis in the       sigmoid colon. This was patent. The anastomosis was traversed. Impression:           - One 3 mm polyp in the ascending colon, removed with a                        cold biopsy forceps. Resected and retrieved.                       - One 4 mm polyp in the descending colon, removed with                        a cold biopsy forceps. Resected and retrieved.                       - Two 2 to 3 mm polyps in the rectum, removed with a                        cold biopsy forceps. Resected and retrieved.                       -  Non-bleeding internal hemorrhoids.                       - Patent end-to-end colo-colonic anastomosis. Recommendation:       - Discharge patient to home.                        - Resume previous diet.                       - Continue present medications.                       - Await pathology results.                       - Repeat colonoscopy in 5 years for surveillance. Procedure Code(s):    --- Professional ---                       [phone], Colonoscopy, flexible; with biopsy, single or                        multiple Diagnosis Code(s):    --- Professional ---                       Z85.038, Personal history of other malignant neoplasm                        of large intestine                       D12.2, Benign neoplasm of ascending colon                       D12.4, Benign neoplasm of descending colon                       K62.1, Rectal polyp CPT copyright 2016 American Medical Association. All rights reserved. The codes documented in this report are preliminary and upon coder review may  be revised to meet current compliance requirements. Lucilla Lame MD, MD 11/28/2016 11:12:02 AM This report has been signed electronically. Number of Addenda: 0 Note Initiated On: 11/28/2016 10:41 AM Scope Withdrawal Time: 0 hours 8 minutes 53 seconds  Total Procedure Duration: 0 hours 12 minutes 11 seconds       [facility]

## 2016-11-28 NOTE — Anesthesia Procedure Notes (Signed)
Procedure Name: MAC Performed by: Imunique Samad Pre-anesthesia Checklist: Patient identified, Emergency Drugs available, Suction available, Timeout performed and Patient being monitored Patient Re-evaluated:Patient Re-evaluated prior to inductionOxygen Delivery Method: Nasal cannula Placement Confirmation: positive ETCO2     

## 2016-11-28 NOTE — Transfer of Care (Signed)
Immediate Anesthesia Transfer of Care Note  Patient: Jamie Martinez  Procedure(s) Performed: Procedure(s): COLONOSCOPY WITH PROPOFOL (N/A) POLYPECTOMY  Patient Location: PACU  Anesthesia Type: MAC  Level of Consciousness: awake, alert  and patient cooperative  Airway and Oxygen Therapy: Patient Spontanous Breathing and Patient connected to supplemental oxygen  Post-op Assessment: Post-op Vital signs reviewed, Patient's Cardiovascular Status Stable, Respiratory Function Stable, Patent Airway and No signs of Nausea or vomiting  Post-op Vital Signs: Reviewed and stable  Complications: No apparent anesthesia complications

## 2016-11-28 NOTE — Anesthesia Postprocedure Evaluation (Signed)
Anesthesia Post Note  Patient: Jamie Martinez  Procedure(s) Performed: Procedure(s) (LRB): COLONOSCOPY WITH PROPOFOL (N/A) POLYPECTOMY  Patient location during evaluation: PACU Anesthesia Type: MAC Level of consciousness: awake and alert and oriented Pain management: satisfactory to patient Vital Signs Assessment: post-procedure vital signs reviewed and stable Respiratory status: spontaneous breathing, nonlabored ventilation and respiratory function stable Cardiovascular status: blood pressure returned to baseline and stable Postop Assessment: Adequate PO intake and No signs of nausea or vomiting Anesthetic complications: no    Raliegh Ip

## 2016-12-01 ENCOUNTER — Encounter: Payer: Self-pay | Admitting: Gastroenterology

## 2016-12-02 ENCOUNTER — Encounter: Payer: Self-pay | Admitting: Gastroenterology

## 2016-12-04 ENCOUNTER — Encounter: Payer: Self-pay | Admitting: Gastroenterology

## 2016-12-05 ENCOUNTER — Telehealth: Payer: Self-pay | Admitting: Gastroenterology

## 2016-12-05 NOTE — Telephone Encounter (Signed)
results

## 2016-12-08 ENCOUNTER — Other Ambulatory Visit: Payer: Self-pay | Admitting: *Deleted

## 2016-12-08 MED ORDER — LETROZOLE 2.5 MG PO TABS: 2.5000 mg | ORAL_TABLET | Freq: Every day | ORAL | 1 refills | Status: DC

## 2016-12-09 ENCOUNTER — Other Ambulatory Visit: Payer: Self-pay | Admitting: *Deleted

## 2016-12-09 MED ORDER — LETROZOLE 2.5 MG PO TABS: 2.5000 mg | ORAL_TABLET | Freq: Every day | ORAL | 1 refills | Status: AC

## 2016-12-09 NOTE — Telephone Encounter (Signed)
Patient called back regarding results.

## 2016-12-09 NOTE — Telephone Encounter (Signed)
Pt notified of colonoscopy results. Advised pt a letter was mailed to her so she should receive results soon for her records.

## 2017-03-25 ENCOUNTER — Other Ambulatory Visit: Payer: Self-pay

## 2017-03-25 DIAGNOSIS — C50412 Malignant neoplasm of upper-outer quadrant of left female breast: Secondary | ICD-10-CM

## 2017-03-25 DIAGNOSIS — C187 Malignant neoplasm of sigmoid colon: Secondary | ICD-10-CM

## 2017-03-25 DIAGNOSIS — C772 Secondary and unspecified malignant neoplasm of intra-abdominal lymph nodes: Secondary | ICD-10-CM

## 2017-04-01 ENCOUNTER — Ambulatory Visit: Payer: PRIVATE HEALTH INSURANCE

## 2017-04-01 ENCOUNTER — Inpatient Hospital Stay: Payer: PRIVATE HEALTH INSURANCE

## 2017-04-06 ENCOUNTER — Ambulatory Visit: Payer: PRIVATE HEALTH INSURANCE | Admitting: Oncology

## 2017-05-01 ENCOUNTER — Ambulatory Visit
Admission: RE | Admit: 2017-05-01 | Discharge: 2017-05-01 | Disposition: A | Discharge status: Home / Self Care | Payer: PRIVATE HEALTH INSURANCE | Source: Ambulatory Visit | Attending: Oncology | Admitting: Oncology

## 2017-05-01 ENCOUNTER — Inpatient Hospital Stay: Payer: PRIVATE HEALTH INSURANCE | Attending: Oncology

## 2017-05-01 DIAGNOSIS — Z170 Estrogen receptor positive status [ER+]: Secondary | ICD-10-CM | POA: Diagnosis not present

## 2017-05-01 DIAGNOSIS — Z79811 Long term (current) use of aromatase inhibitors: Secondary | ICD-10-CM | POA: Insufficient documentation

## 2017-05-01 DIAGNOSIS — Z85038 Personal history of other malignant neoplasm of large intestine: Secondary | ICD-10-CM | POA: Insufficient documentation

## 2017-05-01 DIAGNOSIS — Z9049 Acquired absence of other specified parts of digestive tract: Secondary | ICD-10-CM | POA: Insufficient documentation

## 2017-05-01 DIAGNOSIS — Z86718 Personal history of other venous thrombosis and embolism: Secondary | ICD-10-CM | POA: Diagnosis not present

## 2017-05-01 DIAGNOSIS — E039 Hypothyroidism, unspecified: Secondary | ICD-10-CM | POA: Diagnosis not present

## 2017-05-01 DIAGNOSIS — Z87891 Personal history of nicotine dependence: Secondary | ICD-10-CM | POA: Diagnosis not present

## 2017-05-01 DIAGNOSIS — C772 Secondary and unspecified malignant neoplasm of intra-abdominal lymph nodes: Secondary | ICD-10-CM | POA: Insufficient documentation

## 2017-05-01 DIAGNOSIS — Z79899 Other long term (current) drug therapy: Secondary | ICD-10-CM | POA: Diagnosis not present

## 2017-05-01 DIAGNOSIS — K8020 Calculus of gallbladder without cholecystitis without obstruction: Secondary | ICD-10-CM | POA: Insufficient documentation

## 2017-05-01 DIAGNOSIS — Z923 Personal history of irradiation: Secondary | ICD-10-CM | POA: Insufficient documentation

## 2017-05-01 DIAGNOSIS — C187 Malignant neoplasm of sigmoid colon: Secondary | ICD-10-CM | POA: Insufficient documentation

## 2017-05-01 DIAGNOSIS — C50412 Malignant neoplasm of upper-outer quadrant of left female breast: Secondary | ICD-10-CM | POA: Insufficient documentation

## 2017-05-01 DIAGNOSIS — Z9221 Personal history of antineoplastic chemotherapy: Secondary | ICD-10-CM | POA: Insufficient documentation

## 2017-05-01 DIAGNOSIS — N8110 Cystocele, unspecified: Secondary | ICD-10-CM | POA: Insufficient documentation

## 2017-05-01 HISTORY — DX: Malignant neoplasm of unspecified site of left female breast: C50.912

## 2017-05-01 LAB — BASIC METABOLIC PANEL
Anion gap: 6 (ref 5–15)
BUN: 16 mg/dL (ref 6–20)
CALCIUM: 9.3 mg/dL (ref 8.9–10.3)
CO2: 26 mmol/L (ref 22–32)
CREATININE: 0.54 mg/dL (ref 0.44–1.00)
Chloride: 104 mmol/L (ref 101–111)
GFR calc non Af Amer: >60 mL/min (ref >60)
Glucose, Bld: 107 mg/dL — ABNORMAL HIGH (ref 65–99)
Potassium: 3.7 mmol/L (ref 3.5–5.1)
SODIUM: 136 mmol/L (ref 135–145)

## 2017-05-01 LAB — CBC WITH DIFFERENTIAL/PLATELET
Basophils Absolute: 0 10*3/uL (ref 0–0.1)
Basophils Relative: 1 %
EOS ABS: 0.1 10*3/uL (ref 0–0.7)
EOS PCT: 1 %
HCT: 37.1 % (ref 35.0–47.0)
Hemoglobin: 12.9 g/dL (ref 12.0–16.0)
Lymphocytes Relative: 27 %
Lymphs Abs: 1.4 10*3/uL (ref 1.0–3.6)
MCH: 30.6 pg (ref 26.0–34.0)
MCHC: 34.8 g/dL (ref 32.0–36.0)
MCV: 88 fL (ref 80.0–100.0)
MONO ABS: 0.3 10*3/uL (ref 0.2–0.9)
MONOS PCT: 5 %
NEUTROS PCT: 66 %
Neutro Abs: 3.6 10*3/uL (ref 1.4–6.5)
Platelets: 191 10*3/uL (ref 150–440)
RBC: 4.21 MIL/uL (ref 3.80–5.20)
RDW: 13.1 % (ref 11.5–14.5)
WBC: 5.4 10*3/uL (ref 3.6–11.0)

## 2017-05-01 MED ORDER — IOPAMIDOL (ISOVUE-300) INJECTION 61%
100.0000 mL | Freq: Once | INTRAVENOUS | Status: AC | PRN
  Administered 2017-05-01: 100 mL via INTRAVENOUS

## 2017-05-02 LAB — CEA: CEA: 1.3 ng/mL (ref 0.0–4.7)

## 2017-05-02 LAB — CANCER ANTIGEN 27.29: CA 27.29: 19 U/mL (ref 0.0–38.6)

## 2017-05-03 NOTE — Progress Notes (Signed)
[place]  [phone] Fax:([phone]) [phone]  ID: Jamie Martinez OB: [birth year]  MR#: [id number]  YDX#:[id number]  Patient Care Team: [place] Pink, MD as PCP - General (Family Medicine)  CHIEF COMPLAINT: Stage Ia adenocarcinoma of the upper outer quadrant of the left breast, stage IIa adenocarcinoma of the sigmoid colon with positive margins.  INTERVAL HISTORY: [name] returns to clinic today for routine evaluation and discussion of her imaging results. She continues to feel well today and is asymptomatic. She is tolerating letrozole well without significant side effects. She has no neurologic complaints. She denies any easy bleeding or bruising.  She denies any fevers, chills, or night sweats. She has no chest pain, shortness of breath, cough, or hemoptysis.  She has a good appetite and denies weight loss.  She denies any nausea vomiting, constipation, or diarrhea.  She denies any melena or hematochezia.  She has no urinary complaints.  She offers no specific complaints today.   REVIEW OF SYSTEMS:   Review of Systems  Constitutional: Negative for fever and malaise/fatigue.  Respiratory: Negative.  Negative for cough and shortness of breath.   Cardiovascular: Negative.  Negative for chest pain and leg swelling.  Gastrointestinal: Negative.  Negative for abdominal pain, blood in stool, constipation, diarrhea, melena, nausea and vomiting.  Musculoskeletal: Negative.   Neurological: Negative.  Negative for sensory change and weakness.  Psychiatric/Behavioral: Negative.  The patient is not nervous/anxious.     As per HPI. Otherwise, a complete review of systems is negative.  PAST MEDICAL HISTORY: Past Medical History:  Diagnosis Date  . Arthritis   . Breast cancer, left ([place]) 2010   left breast surgery and radiation  . Colon cancer ([place]) 2013   colon resection   . DVT of leg (deep venous thrombosis) (HCC) 02-2015   Left leg  . Headache    sinus  .  Hypothyroidism   . Wears dentures    full upper    PAST SURGICAL HISTORY: Past Surgical History:  Procedure Laterality Date  . ABDOMINAL HYSTERECTOMY     partial  . APPENDECTOMY  dec 2013  . BREAST BIOPSY Left 2010   +  . BREAST SURGERY Left 2010   lumpectomy done, lymph nodes removed   . COLON SURGERY  dec 2013   surgery for colon cancer, part of bladder, part of pelvic wall removed appendix removed 1 ovary removed   . COLONOSCOPY WITH PROPOFOL N/A 11/21/2016   Procedure: COLONOSCOPY WITH PROPOFOL;  Surgeon: Lucilla Lame, MD;  Location: [place];  Service: Endoscopy;  Laterality: N/A;  . COLONOSCOPY WITH PROPOFOL N/A 11/28/2016   Procedure: COLONOSCOPY WITH PROPOFOL;  Surgeon: Lucilla Lame, MD;  Location: [place];  Service: Endoscopy;  Laterality: N/A;  . knee arthroscopy Bilateral   . KNEE CLOSED REDUCTION Bilateral 04/23/2015   Procedure: BILATERAL KNEE CLOSED MANIPULATION;  Surgeon: Gaynelle Arabian, MD;  [facility];  Service: Orthopedics;  Laterality: Bilateral;  . POLYPECTOMY  11/28/2016   Procedure: POLYPECTOMY;  Surgeon: Lucilla Lame, MD;  Location: [place];  Service: Endoscopy;;  . TOTAL KNEE ARTHROPLASTY Bilateral 03/07/2015   Procedure: BILATERAL TOTAL KNEE ARTHROPLASTY;  Surgeon: Gaynelle Arabian, MD;  [facility];  Service: Orthopedics;  Laterality: Bilateral;  +epidural    FAMILY HISTORY:  Reviewed and unchanged. No reported history of malignancy or chronic disease.     ADVANCED DIRECTIVES:    HEALTH MAINTENANCE: Social History  Substance Use Topics  . Smoking status: Former Smoker  Quit date: 05/24/2012  . Smokeless tobacco: Never Used  . Alcohol use No     Allergies  Allergen Reactions  . Tramadol Nausea Only and Nausea And Vomiting    Current Outpatient Prescriptions  Medication Sig Dispense Refill  . acetaminophen (TYLENOL) 500 MG tablet Take 500 mg by mouth every 6 (six) hours as needed (Pain).    Marland Kitchen letrozole  (FEMARA) 2.5 MG tablet Take 1 tablet (2.5 mg total) by mouth daily. 90 tablet 1  . levothyroxine (SYNTHROID, LEVOTHROID) 175 MCG tablet Take 175 mcg by mouth daily before breakfast.    . loratadine (CLARITIN) 10 MG tablet Take 10 mg by mouth daily.     No current facility-administered medications for this visit.     OBJECTIVE: Vitals:   05/04/17 1419  BP: (!) 151/84  Pulse: 73  Resp: 18  Temp: 98 F (36.7 C)     Body mass index is 31.04 kg/m.    ECOG FS:0 - Asymptomatic  General: Well-developed, well-nourished, no acute distress. Eyes: Pink conjunctiva, anicteric sclera. Breasts: Bilateral breasts and axilla without lumps or masses.  Lungs: Clear to auscultation bilaterally. Heart: Regular rate and rhythm. No rubs, murmurs, or gallops. Abdomen: Soft, nontender, nondistended. No organomegaly noted, normoactive bowel sounds. Musculoskeletal: No edema, cyanosis, or clubbing. Neuro: Alert, answering all questions appropriately. Cranial nerves grossly intact. Skin: No rashes or petechiae noted. Psych: Normal affect.   LAB RESULTS:  Lab Results  Component Value Date   NA 136 05/01/2017   K 3.7 05/01/2017   CL 104 05/01/2017   CO2 26 05/01/2017   GLUCOSE 107 (H) 05/01/2017   BUN 16 05/01/2017   CREATININE 0.54 05/01/2017   CALCIUM 9.3 05/01/2017   PROT 5.5 (L) 03/12/2015   ALBUMIN 2.6 (L) 03/12/2015   AST 19 03/12/2015   ALT 15 03/12/2015   ALKPHOS 59 03/12/2015   BILITOT 0.7 03/12/2015   GFRNONAA >60 05/01/2017   GFRAA >60 05/01/2017    Lab Results  Component Value Date   WBC 5.4 05/01/2017   NEUTROABS 3.6 05/01/2017   HGB 12.9 05/01/2017   HCT 37.1 05/01/2017   MCV 88.0 05/01/2017   PLT 191 05/01/2017   Lab Results  Component Value Date   LABCA2 19.0 05/01/2017   Lab Results  Component Value Date   CEA 1.3 05/01/2017     STUDIES: Ct Abdomen Pelvis W Contrast  Result Date: 05/01/2017 CLINICAL DATA:  Follow-up colon cancer, status post resection in  2013, status post chemotherapy. History of left breast cancer status post lumpectomy and radiation therapy 2010. EXAM: CT ABDOMEN AND PELVIS WITH CONTRAST TECHNIQUE: Multidetector CT imaging of the abdomen and pelvis was performed using the standard protocol following bolus administration of intravenous contrast. CONTRAST:  133mL ISOVUE-300 IOPAMIDOL (ISOVUE-300) INJECTION 61% COMPARISON:  09/22/2016 FINDINGS: Lower chest: 3 mm triangular subpleural nodule at the left lung base (series 4/image 12), unchanged, likely benign. Hepatobiliary: Liver is within normal limits. Layering gallstones (series 2/ image 26), without associated inflammatory changes. Pancreas: Within normal limits. Spleen: Within normal limits. Adrenals/Urinary Tract: Adrenal glands are within normal limits. Kidneys are within normal limits.  No hydronephrosis. Bladder is low-lying, suggesting a cystocele at rest. Stomach/Bowel: Stomach is notable for a tiny hiatal hernia. No evidence of bowel obstruction. Appendix is not discretely visualized. Status post partial left hemicolectomy with suture line in the left lower pelvis (series 2/image 65). Vascular/Lymphatic: No evidence of abdominal aortic aneurysm. Atherosclerotic calcifications of the abdominal aorta and branch vessels. No suspicious abdominopelvic  lymphadenopathy. Reproductive: Status post hysterectomy. Right ovary is within normal limits.  No left adnexal mass. Other: No abdominopelvic ascites. Musculoskeletal: Visualized osseous structures are within normal limits. IMPRESSION: Status post partial left hemicolectomy. No evidence of recurrence or metastatic disease. Cholelithiasis, without associated inflammatory changes. Cystocele at rest. Electronically Signed   By: Julian Hy M.D.   On: 05/01/2017 13:29    ASSESSMENT: Stage Ia adenocarcinoma of the upper outer quadrant of the left breast, stage IIa adenocarcinoma of the sigmoid colon with positive margins.  PLAN:    1.   Stage Ia adenocarcinoma of the upper outer quadrant of the left breast: No evidence of disease. CA 27.29 continues to be within normal limits.  Her most recent mammogram on March 21, 2016 was reported as BI-RADS 2. Continue letrozole completing 5 years of treatment in August of 2020.  Bone mineral density on March 14, 2014 was reported as -1.0 which is within normal limits. Patient has missed several appointments for a repeat bone mineral density. Patient will require a mammogram in the next 1-2 weeks. Return to clinic in 6 months for further evaluation. 2.  Stage IIa adenocarcinoma of the sigmoid colon with positive margins:  Although patient's stage IIa, she was noted to have positive margins on her colectomy specimen on December 09, 2012. Because of this, she received 12 cycles of adjuvant FOLFOX completing in approximately July 2014. CT scan results from May 01, 2017 reviewed independently and reported as above with no obvious evidence of recurrence or metastatic disease. CEA continues to be within normal limits.  Colonoscopy in December 2017 removed 2 polyps, but otherwise was within normal limits.  Recommendation was repeat colonoscopy in 5 years. No further imaging is needed at this time. Return to clinic in 6 months as above.    Patient expressed understanding and was in agreement with this plan. She also understands that She can call clinic at any time with any questions, concerns, or complaints.    Lloyd Huger, MD   05/04/2017 2:38 PM

## 2017-05-04 ENCOUNTER — Inpatient Hospital Stay (HOSPITAL_BASED_OUTPATIENT_CLINIC_OR_DEPARTMENT_OTHER): Payer: PRIVATE HEALTH INSURANCE | Admitting: Oncology

## 2017-05-04 VITALS — BP 151/84 | HR 73 | Temp 98.0°F | Resp 18 | Wt 210.2 lb

## 2017-05-04 DIAGNOSIS — Z86718 Personal history of other venous thrombosis and embolism: Secondary | ICD-10-CM

## 2017-05-04 DIAGNOSIS — Z9049 Acquired absence of other specified parts of digestive tract: Secondary | ICD-10-CM | POA: Diagnosis not present

## 2017-05-04 DIAGNOSIS — Z87891 Personal history of nicotine dependence: Secondary | ICD-10-CM | POA: Diagnosis not present

## 2017-05-04 DIAGNOSIS — C50412 Malignant neoplasm of upper-outer quadrant of left female breast: Secondary | ICD-10-CM | POA: Diagnosis not present

## 2017-05-04 DIAGNOSIS — Z85038 Personal history of other malignant neoplasm of large intestine: Secondary | ICD-10-CM | POA: Diagnosis not present

## 2017-05-04 DIAGNOSIS — Z79811 Long term (current) use of aromatase inhibitors: Secondary | ICD-10-CM | POA: Diagnosis not present

## 2017-05-04 DIAGNOSIS — Z923 Personal history of irradiation: Secondary | ICD-10-CM

## 2017-05-04 DIAGNOSIS — E039 Hypothyroidism, unspecified: Secondary | ICD-10-CM | POA: Diagnosis not present

## 2017-05-04 DIAGNOSIS — Z170 Estrogen receptor positive status [ER+]: Secondary | ICD-10-CM | POA: Diagnosis not present

## 2017-05-04 DIAGNOSIS — Z79899 Other long term (current) drug therapy: Secondary | ICD-10-CM

## 2017-05-04 DIAGNOSIS — C187 Malignant neoplasm of sigmoid colon: Secondary | ICD-10-CM

## 2017-05-04 DIAGNOSIS — Z9221 Personal history of antineoplastic chemotherapy: Secondary | ICD-10-CM

## 2017-05-04 DIAGNOSIS — C772 Secondary and unspecified malignant neoplasm of intra-abdominal lymph nodes: Secondary | ICD-10-CM

## 2017-05-04 NOTE — Progress Notes (Signed)
Offers no complaints. States is feeling well. 

## 2017-05-06 ENCOUNTER — Ambulatory Visit (INDEPENDENT_AMBULATORY_CARE_PROVIDER_SITE_OTHER): Payer: PRIVATE HEALTH INSURANCE

## 2017-05-06 ENCOUNTER — Encounter: Payer: Self-pay | Admitting: Podiatry

## 2017-05-06 ENCOUNTER — Ambulatory Visit (INDEPENDENT_AMBULATORY_CARE_PROVIDER_SITE_OTHER): Payer: PRIVATE HEALTH INSURANCE | Admitting: Podiatry

## 2017-05-06 DIAGNOSIS — M722 Plantar fascial fibromatosis: Secondary | ICD-10-CM

## 2017-05-06 MED ORDER — MELOXICAM 15 MG PO TABS
15.0000 mg | ORAL_TABLET | Freq: Every day | ORAL | 3 refills | Status: AC
Start: 2017-05-06 — End: ?

## 2017-05-06 MED ORDER — METHYLPREDNISOLONE 4 MG PO TBPK: ORAL_TABLET | ORAL | 0 refills | Status: DC

## 2017-05-06 NOTE — Patient Instructions (Signed)

## 2017-05-06 NOTE — Progress Notes (Signed)
   Subjective:    Patient ID: Jamie Martinez, female    DOB: Nov 20, 1956, 61 y.o.   MRN: [id number]  HPI: She presents today to complaint of pain to bilateral heels she states been aching for several months particularly in the morning she notices it being worse. His also had knee surgery that has resulted in pain and right shoulder pain as well. She takes meloxicam but it really doesn't help the feet.  Review of Systems  Musculoskeletal: Positive for arthralgias and gait problem.  All other systems reviewed and are negative.      Objective:   Physical Exam: Vital signs are stable she is alert and oriented 3. Pulses are palpable. Neurologic sensorium is intact. Reflexes are intact. Muscle strength is 5 over 5 or 6 flexors plantar flexors and inverters and everters all into the musculature is intact. Orthopedic evaluation of his trace pain on palpation medial calcaneal tubercles bilateral. Radiographs do demonstrate plantar distally oriented calcaneal heel spurs and soft tissue increase in density at the plantar fascia calcaneal insertion site indicative of plantar fasciitis. Cutaneous evaluation does not demonstrate any type of obvious cutaneous abnormalities. No open lesions or wounds are noted.        Assessment & Plan:  Plantar fasciitis bilateral.  Plan: Discussed etiology pathology concerned versus surgical therapies. Injected the bilateral heels today with Kenalog local anesthetics or on a Medrol Dosepak to be followed by meloxicam. Placed her in bilateral plantar fascia strappings/braces and started her with a plantar fascia night splint. Discussed appropriate shoe gear stretching exercises ice therapy and shoe modifications. I will follow up with her in 1 month

## 2017-05-27 ENCOUNTER — Ambulatory Visit
Admission: RE | Admit: 2017-05-27 | Discharge: 2017-05-27 | Disposition: A | Discharge status: Home / Self Care | Payer: PRIVATE HEALTH INSURANCE | Source: Ambulatory Visit | Attending: Oncology | Admitting: Oncology

## 2017-05-27 DIAGNOSIS — C772 Secondary and unspecified malignant neoplasm of intra-abdominal lymph nodes: Secondary | ICD-10-CM | POA: Insufficient documentation

## 2017-05-27 DIAGNOSIS — C187 Malignant neoplasm of sigmoid colon: Secondary | ICD-10-CM | POA: Diagnosis not present

## 2017-05-27 DIAGNOSIS — C50412 Malignant neoplasm of upper-outer quadrant of left female breast: Secondary | ICD-10-CM | POA: Insufficient documentation

## 2017-05-27 HISTORY — DX: Malignant neoplasm of unspecified site of unspecified female breast: C50.919

## 2017-06-08 ENCOUNTER — Ambulatory Visit (INDEPENDENT_AMBULATORY_CARE_PROVIDER_SITE_OTHER): Payer: PRIVATE HEALTH INSURANCE | Admitting: Podiatry

## 2017-06-08 ENCOUNTER — Encounter: Payer: Self-pay | Admitting: Podiatry

## 2017-06-08 DIAGNOSIS — M722 Plantar fascial fibromatosis: Secondary | ICD-10-CM | POA: Diagnosis not present

## 2017-06-08 NOTE — Progress Notes (Signed)
She presents today for follow-up of plantar fasciitis bilateral foot. Since the right is doing great the left foot is still hurting. She states the left foot was initial foot to start with a plantar fasciitis. She states she continues her braces and her night splint and her plantar fasciitis brace.  Objective: Vital signs temperature alert and 3. Pulses are palpable. Neurologic sensory was intact. She has pain on palpation left heel none on the right heel.  Assessment: Plantar fasciitis left and right.  Plan: Injected the left heel once again today may need to consider orthotics next visit. Follow up with me in 1 month

## 2017-07-08 ENCOUNTER — Encounter: Payer: Self-pay | Admitting: Podiatry

## 2017-07-08 ENCOUNTER — Ambulatory Visit (INDEPENDENT_AMBULATORY_CARE_PROVIDER_SITE_OTHER): Payer: PRIVATE HEALTH INSURANCE | Admitting: Podiatry

## 2017-07-08 DIAGNOSIS — M722 Plantar fascial fibromatosis: Secondary | ICD-10-CM

## 2017-07-08 MED ORDER — CELECOXIB 200 MG PO CAPS: 200.0000 mg | ORAL_CAPSULE | Freq: Two times a day (BID) | ORAL | 3 refills | Status: DC

## 2017-07-08 NOTE — Progress Notes (Signed)
She presents today for follow-up of her bilateral heel pain.  Objective: Vital signs are stable alert and oriented 3. Pulses are palpable. She has been on palpation medial calcaneal joint was bilateral. Soft tissue increase in density of plantar fascial cranial insertion size was 1 radiographs.  Assessment: Plantar fasciitis bilateral.  Plan: Injected the area today with Kenalog and local anesthetic. She was scanned for orthotics and we and started her on Celebrex. Follow up with her in 1 month.

## 2017-07-29 ENCOUNTER — Encounter: Payer: Self-pay | Admitting: *Deleted

## 2017-07-29 ENCOUNTER — Encounter: Payer: PRIVATE HEALTH INSURANCE | Admitting: Orthotics

## 2017-11-01 NOTE — Progress Notes (Deleted)
[place]  Telephone:(336) [phone] Fax:(336) [phone]  ID: Jamie Martinez OB: [birth year]  MR#: [id number]  EHU#:[id number]  Patient Care Team: Maryland Pink, MD as PCP - General (Family Medicine)  CHIEF COMPLAINT: Stage Ia adenocarcinoma of the upper outer quadrant of the left breast, stage IIa adenocarcinoma of the sigmoid colon with positive margins.  INTERVAL HISTORY: [name] returns to clinic today for routine evaluation and discussion of her imaging results. She continues to feel well today and is asymptomatic. She is tolerating letrozole well without significant side effects. She has no neurologic complaints. She denies any easy bleeding or bruising.  She denies any fevers, chills, or night sweats. She has no chest pain, shortness of breath, cough, or hemoptysis.  She has a good appetite and denies weight loss.  She denies any nausea vomiting, constipation, or diarrhea.  She denies any melena or hematochezia.  She has no urinary complaints.  She offers no specific complaints today.   REVIEW OF SYSTEMS:   Review of Systems  Constitutional: Negative for fever and malaise/fatigue.  Respiratory: Negative.  Negative for cough and shortness of breath.   Cardiovascular: Negative.  Negative for chest pain and leg swelling.  Gastrointestinal: Negative.  Negative for abdominal pain, blood in stool, constipation, diarrhea, melena, nausea and vomiting.  Musculoskeletal: Negative.   Neurological: Negative.  Negative for sensory change and weakness.  Psychiatric/Behavioral: Negative.  The patient is not nervous/anxious.     As per HPI. Otherwise, a complete review of systems is negative.  PAST MEDICAL HISTORY: Past Medical History:  Diagnosis Date  . Arthritis   . Breast cancer ([facility]) 10/2009   positive/ radiation  . Breast cancer, left ([facility]) 2010   left breast surgery and radiation  . Colon cancer ([facility]) 2013   colon resection   . DVT of leg (deep venous thrombosis)  ([facility]) 02-2015   Left leg  . Headache    sinus  . Hypothyroidism   . Wears dentures    full upper    PAST SURGICAL HISTORY: Past Surgical History:  Procedure Laterality Date  . ABDOMINAL HYSTERECTOMY     partial  . APPENDECTOMY  dec 2013  . BREAST BIOPSY Left 2010   +  . BREAST SURGERY Left 2010   lumpectomy done, lymph nodes removed   . COLON SURGERY  dec 2013   surgery for colon cancer, part of bladder, part of pelvic wall removed appendix removed 1 ovary removed   . knee arthroscopy Bilateral     FAMILY HISTORY:  Reviewed and unchanged. No reported history of malignancy or chronic disease.     ADVANCED DIRECTIVES:    HEALTH MAINTENANCE: Social History   Tobacco Use  . Smoking status: Former Smoker    Last attempt to quit: 05/24/2012    Years since quitting: 5.4  . Smokeless tobacco: Never Used  Substance Use Topics  . Alcohol use: No  . Drug use: No     Allergies  Allergen Reactions  . Tramadol Nausea Only and Nausea And Vomiting    Current Outpatient Medications  Medication Sig Dispense Refill  . acetaminophen (TYLENOL) 500 MG tablet Take 500 mg by mouth every 6 (six) hours as needed (Pain).    . celecoxib (CELEBREX) 200 MG capsule Take 1 capsule (200 mg total) by mouth 2 (two) times daily. 60 capsule 3  . cetirizine (ZYRTEC) 10 MG tablet Take by mouth.    . letrozole (FEMARA) 2.5 MG tablet Take 1 tablet (2.5 mg total) by  mouth daily. 90 tablet 1  . levothyroxine (SYNTHROID, LEVOTHROID) 175 MCG tablet Take 175 mcg by mouth daily before breakfast.    . loratadine (CLARITIN) 10 MG tablet Take 10 mg by mouth daily.    . meloxicam (MOBIC) 15 MG tablet Take 1 tablet (15 mg total) by mouth daily. 30 tablet 3   No current facility-administered medications for this visit.     OBJECTIVE: There were no vitals filed for this visit.   There is no height or weight on file to calculate BMI.    ECOG FS:0 - Asymptomatic  General: Well-developed, well-nourished, no  acute distress. Eyes: Pink conjunctiva, anicteric sclera. Breasts: Bilateral breasts and axilla without lumps or masses.  Lungs: Clear to auscultation bilaterally. Heart: Regular rate and rhythm. No rubs, murmurs, or gallops. Abdomen: Soft, nontender, nondistended. No organomegaly noted, normoactive bowel sounds. Musculoskeletal: No edema, cyanosis, or clubbing. Neuro: Alert, answering all questions appropriately. Cranial nerves grossly intact. Skin: No rashes or petechiae noted. Psych: Normal affect.   LAB RESULTS:  Lab Results  Component Value Date   NA 136 05/01/2017   K 3.7 05/01/2017   CL 104 05/01/2017   CO2 26 05/01/2017   GLUCOSE 107 (H) 05/01/2017   BUN 16 05/01/2017   CREATININE 0.54 05/01/2017   CALCIUM 9.3 05/01/2017   PROT 5.5 (L) 03/12/2015   ALBUMIN 2.6 (L) 03/12/2015   AST 19 03/12/2015   ALT 15 03/12/2015   ALKPHOS 59 03/12/2015   BILITOT 0.7 03/12/2015   GFRNONAA >60 05/01/2017   GFRAA >60 05/01/2017    Lab Results  Component Value Date   WBC 5.4 05/01/2017   NEUTROABS 3.6 05/01/2017   HGB 12.9 05/01/2017   HCT 37.1 05/01/2017   MCV 88.0 05/01/2017   PLT 191 05/01/2017   Lab Results  Component Value Date   LABCA2 19.0 05/01/2017   Lab Results  Component Value Date   CEA 1.3 05/01/2017     STUDIES: No results found.  ASSESSMENT: Stage Ia adenocarcinoma of the upper outer quadrant of the left breast, stage IIa adenocarcinoma of the sigmoid colon with positive margins.  PLAN:    1.  Stage Ia adenocarcinoma of the upper outer quadrant of the left breast: No evidence of disease. CA 27.29 continues to be within normal limits.  Her most recent mammogram on March 21, 2016 was reported as BI-RADS 2. Continue letrozole completing 5 years of treatment in August of 2020.  Bone mineral density on March 14, 2014 was reported as -1.0 which is within normal limits. Patient has missed several appointments for a repeat bone mineral density. Patient will  require a mammogram in the next 1-2 weeks. Return to clinic in 6 months for further evaluation. 2.  Stage IIa adenocarcinoma of the sigmoid colon with positive margins:  Although patient's stage IIa, she was noted to have positive margins on her colectomy specimen on December 09, 2012. Because of this, she received 12 cycles of adjuvant FOLFOX completing in approximately July 2014. CT scan results from May 01, 2017 reviewed independently and reported as above with no obvious evidence of recurrence or metastatic disease. CEA continues to be within normal limits.  Colonoscopy in December 2017 removed 2 polyps, but otherwise was within normal limits.  Recommendation was repeat colonoscopy in 5 years. No further imaging is needed at this time. Return to clinic in 6 months as above.    Patient expressed understanding and was in agreement with this plan. She also understands that She can  call clinic at any time with any questions, concerns, or complaints.    Lloyd Huger, MD   11/01/2017 11:11 AM

## 2017-11-04 ENCOUNTER — Inpatient Hospital Stay: Payer: PRIVATE HEALTH INSURANCE | Admitting: Oncology

## 2017-11-04 ENCOUNTER — Telehealth: Payer: Self-pay | Admitting: *Deleted

## 2017-11-04 ENCOUNTER — Inpatient Hospital Stay: Payer: PRIVATE HEALTH INSURANCE

## 2017-11-05 ENCOUNTER — Telehealth: Payer: Self-pay | Admitting: *Deleted

## 2017-11-05 NOTE — Telephone Encounter (Signed)
Called patient in regard to NO SHOW on 11/04/17 for her 6 month lab/MD Patient stated she prefer to come after The New Year. appt was Rsched For 12/30/17 for lab/MD patient is aware. Copy of schedule was mailed out.

## 2017-12-24 ENCOUNTER — Other Ambulatory Visit: Payer: PRIVATE HEALTH INSURANCE

## 2017-12-24 ENCOUNTER — Ambulatory Visit: Payer: PRIVATE HEALTH INSURANCE | Admitting: Oncology

## 2017-12-27 NOTE — Progress Notes (Deleted)
[place]  Telephone:(336) [phone] Fax:(336) [phone]  ID: Jamie Martinez OB: 01-21-1956  MR#: [id number]  AOZ#:[id number]  Patient Care Team: Maryland Pink, MD as PCP - General (Family Medicine)  CHIEF COMPLAINT: Stage Ia adenocarcinoma of the upper outer quadrant of the left breast, stage IIa adenocarcinoma of the sigmoid colon with positive margins.  INTERVAL HISTORY: [name] returns to clinic today for routine evaluation and discussion of her imaging results. She continues to feel well today and is asymptomatic. She is tolerating letrozole well without significant side effects. She has no neurologic complaints. She denies any easy bleeding or bruising.  She denies any fevers, chills, or night sweats. She has no chest pain, shortness of breath, cough, or hemoptysis.  She has a good appetite and denies weight loss.  She denies any nausea vomiting, constipation, or diarrhea.  She denies any melena or hematochezia.  She has no urinary complaints.  She offers no specific complaints today.   REVIEW OF SYSTEMS:   Review of Systems  Constitutional: Negative for fever and malaise/fatigue.  Respiratory: Negative.  Negative for cough and shortness of breath.   Cardiovascular: Negative.  Negative for chest pain and leg swelling.  Gastrointestinal: Negative.  Negative for abdominal pain, blood in stool, constipation, diarrhea, melena, nausea and vomiting.  Musculoskeletal: Negative.   Neurological: Negative.  Negative for sensory change and weakness.  Psychiatric/Behavioral: Negative.  The patient is not nervous/anxious.     As per HPI. Otherwise, a complete review of systems is negative.  PAST MEDICAL HISTORY: Past Medical History:  Diagnosis Date  . Arthritis   . Breast cancer ([facility]) 10/2009   positive/ radiation  . Breast cancer, left ([facility]) 2010   left breast surgery and radiation  . Colon cancer ([facility]) 2013   colon resection   . DVT of leg (deep venous thrombosis)  ([facility]) 02-2015   Left leg  . Headache    sinus  . Hypothyroidism   . Wears dentures    full upper    PAST SURGICAL HISTORY: Past Surgical History:  Procedure Laterality Date  . ABDOMINAL HYSTERECTOMY     partial  . APPENDECTOMY  dec 2013  . BREAST BIOPSY Left 2010   +  . BREAST SURGERY Left 2010   lumpectomy done, lymph nodes removed   . COLON SURGERY  dec 2013   surgery for colon cancer, part of bladder, part of pelvic wall removed appendix removed 1 ovary removed   . COLONOSCOPY WITH PROPOFOL N/A 11/21/2016   Procedure: COLONOSCOPY WITH PROPOFOL;  Surgeon: Lucilla Lame, MD;  Location: [facility];  Service: Endoscopy;  Laterality: N/A;  . COLONOSCOPY WITH PROPOFOL N/A 11/28/2016   Procedure: COLONOSCOPY WITH PROPOFOL;  Surgeon: Lucilla Lame, MD;  Location: [facility];  Service: Endoscopy;  Laterality: N/A;  . knee arthroscopy Bilateral   . KNEE CLOSED REDUCTION Bilateral 04/23/2015   Procedure: BILATERAL KNEE CLOSED MANIPULATION;  Surgeon: Gaynelle Arabian, MD;  Location: [facility];  Service: Orthopedics;  Laterality: Bilateral;  . POLYPECTOMY  11/28/2016   Procedure: POLYPECTOMY;  Surgeon: Lucilla Lame, MD;  Location: [facility];  Service: Endoscopy;;  . TOTAL KNEE ARTHROPLASTY Bilateral 03/07/2015   Procedure: BILATERAL TOTAL KNEE ARTHROPLASTY;  Surgeon: Gaynelle Arabian, MD;  Location: [facility];  Service: Orthopedics;  Laterality: Bilateral;  +epidural    FAMILY HISTORY:  Reviewed and unchanged. No reported history of malignancy or chronic disease.     ADVANCED DIRECTIVES:    HEALTH MAINTENANCE: Social History  Tobacco Use  . Smoking status: Former Smoker    Last attempt to quit: 05/24/2012    Years since quitting: 5.5  . Smokeless tobacco: Never Used  Substance Use Topics  . Alcohol use: No  . Drug use: No     Allergies  Allergen Reactions  . Tramadol Nausea Only and Nausea And Vomiting    Current Outpatient Medications  Medication Sig Dispense  Refill  . acetaminophen (TYLENOL) 500 MG tablet Take 500 mg by mouth every 6 (six) hours as needed (Pain).    . celecoxib (CELEBREX) 200 MG capsule Take 1 capsule (200 mg total) by mouth 2 (two) times daily. 60 capsule 3  . cetirizine (ZYRTEC) 10 MG tablet Take by mouth.    . letrozole (FEMARA) 2.5 MG tablet Take 1 tablet (2.5 mg total) by mouth daily. 90 tablet 1  . levothyroxine (SYNTHROID, LEVOTHROID) 175 MCG tablet Take 175 mcg by mouth daily before breakfast.    . loratadine (CLARITIN) 10 MG tablet Take 10 mg by mouth daily.    . meloxicam (MOBIC) 15 MG tablet Take 1 tablet (15 mg total) by mouth daily. 30 tablet 3   No current facility-administered medications for this visit.     OBJECTIVE: There were no vitals filed for this visit.   There is no height or weight on file to calculate BMI.    ECOG FS:0 - Asymptomatic  General: Well-developed, well-nourished, no acute distress. Eyes: Pink conjunctiva, anicteric sclera. Breasts: Bilateral breasts and axilla without lumps or masses.  Lungs: Clear to auscultation bilaterally. Heart: Regular rate and rhythm. No rubs, murmurs, or gallops. Abdomen: Soft, nontender, nondistended. No organomegaly noted, normoactive bowel sounds. Musculoskeletal: No edema, cyanosis, or clubbing. Neuro: Alert, answering all questions appropriately. Cranial nerves grossly intact. Skin: No rashes or petechiae noted. Psych: Normal affect.   LAB RESULTS:  Lab Results  Component Value Date   NA 136 05/01/2017   K 3.7 05/01/2017   CL 104 05/01/2017   CO2 26 05/01/2017   GLUCOSE 107 (H) 05/01/2017   BUN 16 05/01/2017   CREATININE 0.54 05/01/2017   CALCIUM 9.3 05/01/2017   PROT 5.5 (L) 03/12/2015   ALBUMIN 2.6 (L) 03/12/2015   AST 19 03/12/2015   ALT 15 03/12/2015   ALKPHOS 59 03/12/2015   BILITOT 0.7 03/12/2015   GFRNONAA >60 05/01/2017   GFRAA >60 05/01/2017    Lab Results  Component Value Date   WBC 5.4 05/01/2017   NEUTROABS 3.6 05/01/2017     HGB 12.9 05/01/2017   HCT 37.1 05/01/2017   MCV 88.0 05/01/2017   PLT 191 05/01/2017   Lab Results  Component Value Date   LABCA2 19.0 05/01/2017   Lab Results  Component Value Date   CEA 1.3 05/01/2017     STUDIES: No results found.  ASSESSMENT: Stage Ia adenocarcinoma of the upper outer quadrant of the left breast, stage IIa adenocarcinoma of the sigmoid colon with positive margins.  PLAN:    1.  Stage Ia adenocarcinoma of the upper outer quadrant of the left breast: No evidence of disease. CA 27.29 continues to be within normal limits.  Her most recent mammogram on March 21, 2016 was reported as BI-RADS 2. Continue letrozole completing 5 years of treatment in August of 2020.  Bone mineral density on March 14, 2014 was reported as -1.0 which is within normal limits. Patient has missed several appointments for a repeat bone mineral density. Patient will require a mammogram in the next 1-2 weeks. Return  to clinic in 6 months for further evaluation. 2.  Stage IIa adenocarcinoma of the sigmoid colon with positive margins:  Although patient's stage IIa, she was noted to have positive margins on her colectomy specimen on December 09, 2012. Because of this, she received 12 cycles of adjuvant FOLFOX completing in approximately July 2014. CT scan results from May 01, 2017 reviewed independently and reported as above with no obvious evidence of recurrence or metastatic disease. CEA continues to be within normal limits.  Colonoscopy in December 2017 removed 2 polyps, but otherwise was within normal limits.  Recommendation was repeat colonoscopy in 5 years. No further imaging is needed at this time. Return to clinic in 6 months as above.    Patient expressed understanding and was in agreement with this plan. She also understands that She can call clinic at any time with any questions, concerns, or complaints.    Lloyd Huger, MD   12/27/2017 9:46 PM

## 2017-12-30 ENCOUNTER — Inpatient Hospital Stay: Payer: Self-pay

## 2017-12-30 ENCOUNTER — Inpatient Hospital Stay: Payer: Self-pay | Admitting: Oncology

## 2018-04-04 NOTE — Progress Notes (Signed)
[place]  Telephone:(336) [phone] Fax:(336) [phone]  ID: Jamie Martinez OB: Jan 15, 1956  MR#: [id number]  YYQ#:[id number]  Patient Care Team: Maryland Pink, MD as PCP - General (Family Medicine)  CHIEF COMPLAINT: Stage Ia adenocarcinoma of the upper outer quadrant of the left breast, stage IIa adenocarcinoma of the sigmoid colon with positive margins.  INTERVAL HISTORY: Patient last evaluated in clinic nearly 1 year ago in May 2018.  She returns to clinic today for laboratory work and further evaluation.  She currently feels well and is asymptomatic.  She continues to tolerate letrozole without significant side effects. She has no neurologic complaints. She denies any fevers, chills, or night sweats. She has no chest pain, shortness of breath, cough, or hemoptysis.  She has a good appetite and denies weight loss.  She denies any nausea, vomiting, constipation, or diarrhea.  She has noted no changes in her bowel movements.  She denies any melena or hematochezia.  She has no urinary complaints.  Patient feels at her baseline offers no specific complaints today.  REVIEW OF SYSTEMS:   Review of Systems  Constitutional: Negative.  Negative for fever, malaise/fatigue and weight loss.  Respiratory: Negative.  Negative for cough and shortness of breath.   Cardiovascular: Negative.  Negative for chest pain and leg swelling.  Gastrointestinal: Negative.  Negative for abdominal pain, blood in stool, constipation, diarrhea, melena, nausea and vomiting.  Genitourinary: Negative.   Musculoskeletal: Negative.   Skin: Negative.  Negative for rash.  Neurological: Negative.  Negative for sensory change, focal weakness and weakness.  Psychiatric/Behavioral: Negative.  The patient is not nervous/anxious.     As per HPI. Otherwise, a complete review of systems is negative.  PAST MEDICAL HISTORY: Past Medical History:  Diagnosis Date  . Arthritis   . Breast cancer ([facility]) 10/2009   positive/ radiation  . Breast cancer, left ([place] [facility]) 2010   left breast surgery and radiation  . Colon cancer ([facility]) 2013   colon resection   . DVT of leg (deep venous thrombosis) ([facility]) 02-2015   Left leg  . Headache    sinus  . Hypothyroidism   . Wears dentures    full upper    PAST SURGICAL HISTORY: Past Surgical History:  Procedure Laterality Date  . ABDOMINAL HYSTERECTOMY     partial  . APPENDECTOMY  dec 2013  . BREAST BIOPSY Left 2010   +  . BREAST SURGERY Left 2010   lumpectomy done, lymph nodes removed   . COLON SURGERY  dec 2013   surgery for colon cancer, part of bladder, part of pelvic wall removed appendix removed 1 ovary removed   . COLONOSCOPY WITH PROPOFOL N/A 11/21/2016   Procedure: COLONOSCOPY WITH PROPOFOL;  Surgeon: Lucilla Lame, MD;  Location: [facility];  Service: Endoscopy;  Laterality: N/A;  . COLONOSCOPY WITH PROPOFOL N/A 11/28/2016   Procedure: COLONOSCOPY WITH PROPOFOL;  Surgeon: Lucilla Lame, MD;  Location: [facility];  Service: Endoscopy;  Laterality: N/A;  . knee arthroscopy Bilateral   . KNEE CLOSED REDUCTION Bilateral 04/23/2015   Procedure: BILATERAL KNEE CLOSED MANIPULATION;  Surgeon: Gaynelle Arabian, MD;  Location: [facility];  Service: Orthopedics;  Laterality: Bilateral;  . POLYPECTOMY  11/28/2016   Procedure: POLYPECTOMY;  Surgeon: Lucilla Lame, MD;  Location: [place];  Service: Endoscopy;;  . TOTAL KNEE ARTHROPLASTY Bilateral 03/07/2015   Procedure: BILATERAL TOTAL KNEE ARTHROPLASTY;  Surgeon: Gaynelle Arabian, MD;  Location: [facility];  Service: Orthopedics;  Laterality: Bilateral;  +epidural  FAMILY HISTORY:  Reviewed and unchanged. No reported history of malignancy or chronic disease.     ADVANCED DIRECTIVES:    HEALTH MAINTENANCE: Social History   Tobacco Use  . Smoking status: Former Smoker    Last attempt to quit: 05/24/2012    Years since quitting: 5.8  . Smokeless tobacco: Never Used  Substance Use Topics  .  Alcohol use: No  . Drug use: No     Allergies  Allergen Reactions  . Tramadol Nausea Only and Nausea And Vomiting    Current Outpatient Medications  Medication Sig Dispense Refill  . acetaminophen (TYLENOL) 500 MG tablet Take 500 mg by mouth every 6 (six) hours as needed (Pain).    Marland Kitchen letrozole (FEMARA) 2.5 MG tablet Take 1 tablet (2.5 mg total) by mouth daily. 90 tablet 1  . levothyroxine (SYNTHROID, LEVOTHROID) 175 MCG tablet Take 175 mcg by mouth daily before breakfast.    . loratadine (CLARITIN) 10 MG tablet Take 10 mg by mouth daily.    . meloxicam (MOBIC) 15 MG tablet Take 1 tablet (15 mg total) by mouth daily. 30 tablet 3   No current facility-administered medications for this visit.     OBJECTIVE: Vitals:   04/07/18 0949  BP: (!) 161/92  Pulse: 82  Resp: 20  Temp: (!) 97.3 F (36.3 C)     Body mass index is 31.99 kg/m.    ECOG FS:0 - Asymptomatic  General: Well-developed, well-nourished, no acute distress. Eyes: Pink conjunctiva, anicteric sclera. Breast: Patient declined exam today.. Lungs: Clear to auscultation bilaterally. Heart: Regular rate and rhythm. No rubs, murmurs, or gallops. Abdomen: Soft, nontender, nondistended. No organomegaly noted, normoactive bowel sounds. Musculoskeletal: No edema, cyanosis, or clubbing. Neuro: Alert, answering all questions appropriately. Cranial nerves grossly intact. Skin: No rashes or petechiae noted. Psych: Normal affect.   LAB RESULTS:  Lab Results  Component Value Date   NA 136 05/01/2017   K 3.7 05/01/2017   CL 104 05/01/2017   CO2 26 05/01/2017   GLUCOSE 107 (H) 05/01/2017   BUN 16 05/01/2017   CREATININE 0.54 05/01/2017   CALCIUM 9.3 05/01/2017   PROT 5.5 (L) 03/12/2015   ALBUMIN 2.6 (L) 03/12/2015   AST 19 03/12/2015   ALT 15 03/12/2015   ALKPHOS 59 03/12/2015   BILITOT 0.7 03/12/2015   GFRNONAA >60 05/01/2017   GFRAA >60 05/01/2017    Lab Results  Component Value Date   WBC 5.3 04/07/2018    NEUTROABS 3.5 04/07/2018   HGB 13.2 04/07/2018   HCT 38.4 04/07/2018   MCV 88.4 04/07/2018   PLT 183 04/07/2018   Lab Results  Component Value Date   LABCA2 19.0 05/01/2017   Lab Results  Component Value Date   CEA 1.3 05/01/2017     STUDIES: No results found.  ASSESSMENT: Stage Ia adenocarcinoma of the upper outer quadrant of the left breast, stage IIa adenocarcinoma of the sigmoid colon with positive margins.  PLAN:    1.  Stage Ia adenocarcinoma of the upper outer quadrant of the left breast: No evidence of disease. Continue letrozole completing 5 years of treatment in August of 2020.  Her most recent mammogram on May 27, 2017 was reported as BI-RADS 2.  Repeat in June 2019.  Bone mineral density on March 14, 2014 was reported as -1.0 which is within normal limits. Patient has missed multiple appointments for a repeat bone mineral density.  Will attempt to reschedule in the next 1 to 2 weeks.  Return  to clinic in 6 months for further evaluation. 2.  Stage IIa adenocarcinoma of the sigmoid colon with positive margins:  Although patient's stage IIa, she was noted to have positive margins on her colectomy specimen on December 09, 2012.  She declined reexcision at that time.  Because of this, she received 12 cycles of adjuvant FOLFOX completing in approximately July 2014. CT scan results from May 01, 2017 reviewed independently with no obvious evidence of recurrence or metastatic disease.  CEA is 0.8 today.  Patient's most recent colonoscopy in December 2017 removed 2 polyps, but otherwise was within normal limits.  Her next colonoscopy will be due in approximately December 2020. No further imaging is needed at this time. Return to clinic in 6 months as above.  Approximately 20 minutes was spent in discussion of which greater than 50% was consultation.   Patient expressed understanding and was in agreement with this plan. She also understands that She can call clinic at any time with any  questions, concerns, or complaints.    Lloyd Huger, MD   04/11/2018 7:41 AM

## 2018-04-07 ENCOUNTER — Inpatient Hospital Stay: Payer: Managed Care, Other (non HMO) | Attending: Oncology

## 2018-04-07 ENCOUNTER — Inpatient Hospital Stay (HOSPITAL_BASED_OUTPATIENT_CLINIC_OR_DEPARTMENT_OTHER): Payer: Managed Care, Other (non HMO) | Admitting: Oncology

## 2018-04-07 VITALS — BP 161/92 | HR 82 | Temp 97.3°F | Resp 20 | Wt 216.6 lb

## 2018-04-07 DIAGNOSIS — Z87891 Personal history of nicotine dependence: Secondary | ICD-10-CM | POA: Insufficient documentation

## 2018-04-07 DIAGNOSIS — Z923 Personal history of irradiation: Secondary | ICD-10-CM

## 2018-04-07 DIAGNOSIS — Z170 Estrogen receptor positive status [ER+]: Secondary | ICD-10-CM

## 2018-04-07 DIAGNOSIS — C50412 Malignant neoplasm of upper-outer quadrant of left female breast: Secondary | ICD-10-CM

## 2018-04-07 DIAGNOSIS — Z79811 Long term (current) use of aromatase inhibitors: Secondary | ICD-10-CM | POA: Diagnosis not present

## 2018-04-07 DIAGNOSIS — Z9221 Personal history of antineoplastic chemotherapy: Secondary | ICD-10-CM | POA: Insufficient documentation

## 2018-04-07 DIAGNOSIS — Z86718 Personal history of other venous thrombosis and embolism: Secondary | ICD-10-CM

## 2018-04-07 DIAGNOSIS — E039 Hypothyroidism, unspecified: Secondary | ICD-10-CM | POA: Insufficient documentation

## 2018-04-07 DIAGNOSIS — Z9049 Acquired absence of other specified parts of digestive tract: Secondary | ICD-10-CM

## 2018-04-07 DIAGNOSIS — C772 Secondary and unspecified malignant neoplasm of intra-abdominal lymph nodes: Secondary | ICD-10-CM

## 2018-04-07 DIAGNOSIS — Z888 Allergy status to other drugs, medicaments and biological substances status: Secondary | ICD-10-CM

## 2018-04-07 DIAGNOSIS — Z85038 Personal history of other malignant neoplasm of large intestine: Secondary | ICD-10-CM

## 2018-04-07 DIAGNOSIS — Z79899 Other long term (current) drug therapy: Secondary | ICD-10-CM | POA: Diagnosis not present

## 2018-04-07 DIAGNOSIS — C187 Malignant neoplasm of sigmoid colon: Secondary | ICD-10-CM

## 2018-04-07 LAB — CBC WITH DIFFERENTIAL/PLATELET
Basophils Absolute: 0 10*3/uL (ref 0–0.1)
Basophils Relative: 0 %
Eosinophils Absolute: 0.1 10*3/uL (ref 0–0.7)
Eosinophils Relative: 1 %
HEMATOCRIT: 38.4 % (ref 35.0–47.0)
HEMOGLOBIN: 13.2 g/dL (ref 12.0–16.0)
LYMPHS ABS: 1.4 10*3/uL (ref 1.0–3.6)
LYMPHS PCT: 26 %
MCH: 30.5 pg (ref 26.0–34.0)
MCHC: 34.5 g/dL (ref 32.0–36.0)
MCV: 88.4 fL (ref 80.0–100.0)
Monocytes Absolute: 0.3 10*3/uL (ref 0.2–0.9)
Monocytes Relative: 6 %
NEUTROS ABS: 3.5 10*3/uL (ref 1.4–6.5)
NEUTROS PCT: 67 %
Platelets: 183 10*3/uL (ref 150–440)
RBC: 4.34 MIL/uL (ref 3.80–5.20)
RDW: 12.9 % (ref 11.5–14.5)
WBC: 5.3 10*3/uL (ref 3.6–11.0)

## 2018-04-07 NOTE — Progress Notes (Signed)
Patient denies any concerns today.  

## 2018-04-08 LAB — CEA: CEA1: 0.8 ng/mL (ref 0.0–4.7)

## 2018-04-26 ENCOUNTER — Ambulatory Visit
Admission: RE | Admit: 2018-04-26 | Discharge: 2018-04-26 | Disposition: A | Discharge status: Home / Self Care | Payer: Managed Care, Other (non HMO) | Source: Ambulatory Visit | Attending: Oncology | Admitting: Oncology

## 2018-04-26 DIAGNOSIS — Z79899 Other long term (current) drug therapy: Secondary | ICD-10-CM | POA: Insufficient documentation

## 2018-04-26 DIAGNOSIS — C50412 Malignant neoplasm of upper-outer quadrant of left female breast: Secondary | ICD-10-CM | POA: Insufficient documentation

## 2018-06-01 ENCOUNTER — Ambulatory Visit
Admission: RE | Admit: 2018-06-01 | Discharge: 2018-06-01 | Disposition: A | Discharge status: Home / Self Care | Payer: Managed Care, Other (non HMO) | Source: Ambulatory Visit | Attending: Oncology | Admitting: Oncology

## 2018-06-01 DIAGNOSIS — C50412 Malignant neoplasm of upper-outer quadrant of left female breast: Secondary | ICD-10-CM | POA: Insufficient documentation

## 2018-10-03 NOTE — Progress Notes (Signed)
[name]  [phone] [phone]) [phone]  ID: Jamie Martinez OB: 1956/05/19  MR#: [id number]  JAS#:[id number]  Patient Care Team: Maryland Pink, MD as PCP - General (Family Medicine)  CHIEF COMPLAINT: Stage Ia adenocarcinoma of the upper outer quadrant of the left breast, stage IIa adenocarcinoma of the sigmoid colon with positive margins.  INTERVAL HISTORY: Patient returns to clinic today for routine six-month evaluation.  She continues to tolerate letrozole well without significant side effects.  She currently feels well and is asymptomatic. She has no neurologic complaints.  She denies any recent fevers or illnesses.  She has no chest pain, shortness of breath, cough, or hemoptysis.  She has a good appetite and denies weight loss.  She denies any nausea, vomiting, constipation, or diarrhea.  She has noted no changes in her bowel movements.  She denies any melena or hematochezia.  She has no urinary complaints.  Patient feels at her baseline offers no specific complaints today.  REVIEW OF SYSTEMS:   Review of Systems  Constitutional: Negative.  Negative for fever, malaise/fatigue and weight loss.  Respiratory: Negative.  Negative for cough and shortness of breath.   Cardiovascular: Negative.  Negative for chest pain and leg swelling.  Gastrointestinal: Negative.  Negative for abdominal pain, blood in stool, constipation, diarrhea, melena, nausea and vomiting.  Genitourinary: Negative.  Negative for dysuria.  Musculoskeletal: Negative.  Negative for back pain.  Skin: Negative.  Negative for rash.  Neurological: Negative.  Negative for sensory change, focal weakness, weakness and headaches.  Psychiatric/Behavioral: Negative.  The patient is not nervous/anxious.     As per HPI. Otherwise, a complete review of systems is negative.  PAST MEDICAL HISTORY: Past Medical History:  Diagnosis Date  . Arthritis   . Breast cancer ([facility] [facility]) 10/2009   positive/  radiation  . Breast cancer, left ([place]) 2010   left breast surgery and radiation  . Colon cancer ([facility]) 2013   colon resection   . DVT of leg (deep venous thrombosis) ([facility]) 02-2015   Left leg  . Headache    sinus  . Hypothyroidism   . Wears dentures    full upper    PAST SURGICAL HISTORY: Past Surgical History:  Procedure Laterality Date  . ABDOMINAL HYSTERECTOMY     partial  . APPENDECTOMY  dec 2013  . BREAST BIOPSY Left 2010   +  . BREAST SURGERY Left 2010   lumpectomy done, lymph nodes removed   . COLON SURGERY  dec 2013   surgery for colon cancer, part of bladder, part of pelvic wall removed appendix removed 1 ovary removed   . COLONOSCOPY WITH PROPOFOL N/A 11/21/2016   Procedure: COLONOSCOPY WITH PROPOFOL;  Surgeon: Lucilla Lame, MD;  Location: [facility];  Service: Endoscopy;  Laterality: N/A;  . COLONOSCOPY WITH PROPOFOL N/A 11/28/2016   Procedure: COLONOSCOPY WITH PROPOFOL;  Surgeon: Lucilla Lame, MD;  Location: [facility];  Service: Endoscopy;  Laterality: N/A;  . knee arthroscopy Bilateral   . KNEE CLOSED REDUCTION Bilateral 04/23/2015   Procedure: BILATERAL KNEE CLOSED MANIPULATION;  Surgeon: Gaynelle Arabian, MD;  Location: [facility];  Service: Orthopedics;  Laterality: Bilateral;  . POLYPECTOMY  11/28/2016   Procedure: POLYPECTOMY;  Surgeon: Lucilla Lame, MD;  Location: [facility];  Service: Endoscopy;;  . TOTAL KNEE ARTHROPLASTY Bilateral 03/07/2015   Procedure: BILATERAL TOTAL KNEE ARTHROPLASTY;  Surgeon: Gaynelle Arabian, MD;  Location: [facility];  Service: Orthopedics;  Laterality: Bilateral;  +epidural    FAMILY HISTORY:  Reviewed and unchanged. No reported history of malignancy or chronic disease.     ADVANCED DIRECTIVES:    HEALTH MAINTENANCE: Social History   Tobacco Use  . Smoking status: Former Smoker    Last attempt to quit: 05/24/2012    Years since quitting: 6.3  . Smokeless tobacco: Never Used  Substance Use Topics  . Alcohol use: No    . Drug use: No     Allergies  Allergen Reactions  . Tramadol Nausea Only and Nausea And Vomiting    Current Outpatient Medications  Medication Sig Dispense Refill  . acetaminophen (TYLENOL) 500 MG tablet Take 500 mg by mouth every 6 (six) hours as needed (Pain).    Marland Kitchen letrozole (FEMARA) 2.5 MG tablet Take 1 tablet (2.5 mg total) by mouth daily. 90 tablet 1  . levothyroxine (SYNTHROID, LEVOTHROID) 175 MCG tablet Take 175 mcg by mouth daily before breakfast.    . loratadine (CLARITIN) 10 MG tablet Take 10 mg by mouth daily.    . meloxicam (MOBIC) 15 MG tablet Take 1 tablet (15 mg total) by mouth daily. 30 tablet 3   No current facility-administered medications for this visit.     OBJECTIVE: Vitals:   10/07/18 1132 10/07/18 1135  BP:  (!) 152/110  Pulse:  82  Resp: 16   Temp:  98.2 F (36.8 C)     Body mass index is 31.25 kg/m.    ECOG FS:0 - Asymptomatic  General: Well-developed, well-nourished, no acute distress. Eyes: Pink conjunctiva, anicteric sclera. HEENT: Normocephalic, moist mucous membranes. Lungs: Clear to auscultation bilaterally. Heart: Regular rate and rhythm. No rubs, murmurs, or gallops. Abdomen: Soft, nontender, nondistended. No organomegaly noted, normoactive bowel sounds. Musculoskeletal: No edema, cyanosis, or clubbing. Neuro: Alert, answering all questions appropriately. Cranial nerves grossly intact. Skin: No rashes or petechiae noted. Psych: Normal affect.  LAB RESULTS:  Lab Results  Component Value Date   NA 136 05/01/2017   K 3.7 05/01/2017   CL 104 05/01/2017   CO2 26 05/01/2017   GLUCOSE 107 (H) 05/01/2017   BUN 16 05/01/2017   CREATININE 0.54 05/01/2017   CALCIUM 9.3 05/01/2017   PROT 5.5 (L) 03/12/2015   ALBUMIN 2.6 (L) 03/12/2015   AST 19 03/12/2015   ALT 15 03/12/2015   ALKPHOS 59 03/12/2015   BILITOT 0.7 03/12/2015   GFRNONAA >60 05/01/2017   GFRAA >60 05/01/2017    Lab Results  Component Value Date   WBC 6.0 10/07/2018    NEUTROABS 3.9 10/07/2018   HGB 13.2 10/07/2018   HCT 39.7 10/07/2018   MCV 90.6 10/07/2018   PLT 192 10/07/2018   Lab Results  Component Value Date   LABCA2 19.0 05/01/2017   Lab Results  Component Value Date   CEA 1.3 05/01/2017     STUDIES: No results found.  ASSESSMENT: Stage Ia adenocarcinoma of the upper outer quadrant of the left breast, stage IIa adenocarcinoma of the sigmoid colon with positive margins.  PLAN:    1.  Stage Ia adenocarcinoma of the upper outer quadrant of the left breast: No evidence of disease. Continue letrozole completing 5 years of treatment in August of 2020.  Her most recent mammogram on June 01, 2018 was reported as BI-RADS 1.  Repeat in June 2020.  Repeat bone mineral density on May 05/2018 reported T score of -0.7 which continues to be within normal limits.  Repeat in May 2021.  Return to clinic in 6 months for routine evaluation.   2.  Stage  IIa adenocarcinoma of the sigmoid colon with positive margins:  Although patient's stage IIa, she was noted to have positive margins on her colectomy specimen on December 09, 2012.  She declined reexcision at that time.  Because of this, she received 12 cycles of adjuvant FOLFOX completing in approximately July 2014. CT scan results from May 01, 2017 reviewed independently with no obvious evidence of recurrence or metastatic disease.  CEA continues to be within normal limits at 1.2. Patient's most recent colonoscopy in December 2017 removed 2 polyps, but otherwise was within normal limits.  Her next colonoscopy will be due in approximately December 2020. No further imaging is needed at this time.  Return to clinic in 6 months as above.   Patient expressed understanding and was in agreement with this plan. She also understands that She can call clinic at any time with any questions, concerns, or complaints.    Lloyd Huger, MD   10/08/2018 1:24 PM

## 2018-10-07 ENCOUNTER — Other Ambulatory Visit: Payer: Self-pay

## 2018-10-07 ENCOUNTER — Encounter: Payer: Self-pay | Admitting: Oncology

## 2018-10-07 ENCOUNTER — Inpatient Hospital Stay: Payer: Managed Care, Other (non HMO) | Attending: Oncology

## 2018-10-07 ENCOUNTER — Inpatient Hospital Stay (HOSPITAL_BASED_OUTPATIENT_CLINIC_OR_DEPARTMENT_OTHER): Payer: Managed Care, Other (non HMO) | Admitting: Oncology

## 2018-10-07 VITALS — BP 152/110 | HR 82 | Temp 98.2°F | Resp 16 | Ht 69.0 in | Wt 211.6 lb

## 2018-10-07 DIAGNOSIS — Z9049 Acquired absence of other specified parts of digestive tract: Secondary | ICD-10-CM | POA: Diagnosis not present

## 2018-10-07 DIAGNOSIS — Z9221 Personal history of antineoplastic chemotherapy: Secondary | ICD-10-CM | POA: Insufficient documentation

## 2018-10-07 DIAGNOSIS — Z85038 Personal history of other malignant neoplasm of large intestine: Secondary | ICD-10-CM | POA: Diagnosis not present

## 2018-10-07 DIAGNOSIS — Z923 Personal history of irradiation: Secondary | ICD-10-CM | POA: Diagnosis not present

## 2018-10-07 DIAGNOSIS — Z79811 Long term (current) use of aromatase inhibitors: Secondary | ICD-10-CM | POA: Insufficient documentation

## 2018-10-07 DIAGNOSIS — M1990 Unspecified osteoarthritis, unspecified site: Secondary | ICD-10-CM | POA: Diagnosis not present

## 2018-10-07 DIAGNOSIS — C772 Secondary and unspecified malignant neoplasm of intra-abdominal lymph nodes: Secondary | ICD-10-CM

## 2018-10-07 DIAGNOSIS — E039 Hypothyroidism, unspecified: Secondary | ICD-10-CM | POA: Diagnosis not present

## 2018-10-07 DIAGNOSIS — C50412 Malignant neoplasm of upper-outer quadrant of left female breast: Secondary | ICD-10-CM

## 2018-10-07 DIAGNOSIS — Z170 Estrogen receptor positive status [ER+]: Secondary | ICD-10-CM | POA: Diagnosis not present

## 2018-10-07 DIAGNOSIS — C187 Malignant neoplasm of sigmoid colon: Secondary | ICD-10-CM

## 2018-10-07 DIAGNOSIS — Z79899 Other long term (current) drug therapy: Secondary | ICD-10-CM

## 2018-10-07 DIAGNOSIS — Z87891 Personal history of nicotine dependence: Secondary | ICD-10-CM

## 2018-10-07 DIAGNOSIS — Z86718 Personal history of other venous thrombosis and embolism: Secondary | ICD-10-CM | POA: Diagnosis not present

## 2018-10-07 DIAGNOSIS — Z888 Allergy status to other drugs, medicaments and biological substances status: Secondary | ICD-10-CM | POA: Insufficient documentation

## 2018-10-07 LAB — CBC WITH DIFFERENTIAL/PLATELET
Abs Immature Granulocytes: 0.03 10*3/uL (ref 0.00–0.07)
Basophils Absolute: 0 10*3/uL (ref 0.0–0.1)
Basophils Relative: 1 %
EOS PCT: 1 %
Eosinophils Absolute: 0.1 10*3/uL (ref 0.0–0.5)
HEMATOCRIT: 39.7 % (ref 36.0–46.0)
HEMOGLOBIN: 13.2 g/dL (ref 12.0–15.0)
Immature Granulocytes: 1 %
LYMPHS PCT: 26 %
Lymphs Abs: 1.6 10*3/uL (ref 0.7–4.0)
MCH: 30.1 pg (ref 26.0–34.0)
MCHC: 33.2 g/dL (ref 30.0–36.0)
MCV: 90.6 fL (ref 80.0–100.0)
MONO ABS: 0.4 10*3/uL (ref 0.1–1.0)
Monocytes Relative: 6 %
Neutro Abs: 3.9 10*3/uL (ref 1.7–7.7)
Neutrophils Relative %: 65 %
Platelets: 192 10*3/uL (ref 150–400)
RBC: 4.38 MIL/uL (ref 3.87–5.11)
RDW: 13.5 % (ref 11.5–15.5)
WBC: 6 10*3/uL (ref 4.0–10.5)
nRBC: 0 % (ref 0.0–0.2)

## 2018-10-07 NOTE — Progress Notes (Signed)
Patient here for follow up. No changes since her last appointment. Her BP is elevated today 152/110.

## 2018-10-08 LAB — CEA: CEA1: 1.2 ng/mL (ref 0.0–4.7)

## 2019-04-04 ENCOUNTER — Telehealth: Payer: Self-pay | Admitting: *Deleted

## 2019-04-04 NOTE — Telephone Encounter (Signed)
Left vm for patient regarding changing appointment from office visit to virtual visit. Patient needs to come in for lab only, schedule for virtual visit 2-3 days later for results.

## 2019-04-12 ENCOUNTER — Ambulatory Visit: Payer: Managed Care, Other (non HMO) | Admitting: Oncology

## 2019-04-12 ENCOUNTER — Other Ambulatory Visit: Payer: Managed Care, Other (non HMO)

## 2019-04-22 ENCOUNTER — Encounter (INDEPENDENT_AMBULATORY_CARE_PROVIDER_SITE_OTHER): Payer: Self-pay

## 2019-05-03 ENCOUNTER — Other Ambulatory Visit: Payer: Self-pay

## 2019-05-03 DIAGNOSIS — C772 Secondary and unspecified malignant neoplasm of intra-abdominal lymph nodes: Secondary | ICD-10-CM

## 2019-05-03 DIAGNOSIS — C187 Malignant neoplasm of sigmoid colon: Secondary | ICD-10-CM

## 2019-05-03 DIAGNOSIS — C50412 Malignant neoplasm of upper-outer quadrant of left female breast: Secondary | ICD-10-CM

## 2019-05-04 ENCOUNTER — Inpatient Hospital Stay: Payer: Managed Care, Other (non HMO) | Attending: Oncology

## 2019-05-04 ENCOUNTER — Other Ambulatory Visit: Payer: Self-pay

## 2019-05-04 DIAGNOSIS — Z85038 Personal history of other malignant neoplasm of large intestine: Secondary | ICD-10-CM | POA: Diagnosis not present

## 2019-05-04 DIAGNOSIS — Z170 Estrogen receptor positive status [ER+]: Secondary | ICD-10-CM | POA: Insufficient documentation

## 2019-05-04 DIAGNOSIS — Z9049 Acquired absence of other specified parts of digestive tract: Secondary | ICD-10-CM | POA: Insufficient documentation

## 2019-05-04 DIAGNOSIS — E039 Hypothyroidism, unspecified: Secondary | ICD-10-CM | POA: Diagnosis not present

## 2019-05-04 DIAGNOSIS — Z791 Long term (current) use of non-steroidal anti-inflammatories (NSAID): Secondary | ICD-10-CM | POA: Diagnosis not present

## 2019-05-04 DIAGNOSIS — Z9221 Personal history of antineoplastic chemotherapy: Secondary | ICD-10-CM | POA: Insufficient documentation

## 2019-05-04 DIAGNOSIS — Z87891 Personal history of nicotine dependence: Secondary | ICD-10-CM | POA: Diagnosis not present

## 2019-05-04 DIAGNOSIS — C50412 Malignant neoplasm of upper-outer quadrant of left female breast: Secondary | ICD-10-CM | POA: Diagnosis not present

## 2019-05-04 DIAGNOSIS — Z79811 Long term (current) use of aromatase inhibitors: Secondary | ICD-10-CM | POA: Insufficient documentation

## 2019-05-04 DIAGNOSIS — Z86718 Personal history of other venous thrombosis and embolism: Secondary | ICD-10-CM | POA: Diagnosis not present

## 2019-05-04 DIAGNOSIS — M1990 Unspecified osteoarthritis, unspecified site: Secondary | ICD-10-CM | POA: Diagnosis not present

## 2019-05-04 DIAGNOSIS — Z79899 Other long term (current) drug therapy: Secondary | ICD-10-CM | POA: Insufficient documentation

## 2019-05-04 DIAGNOSIS — Z923 Personal history of irradiation: Secondary | ICD-10-CM | POA: Insufficient documentation

## 2019-05-04 LAB — CBC WITH DIFFERENTIAL/PLATELET
Abs Immature Granulocytes: 0.02 10*3/uL (ref 0.00–0.07)
Basophils Absolute: 0 10*3/uL (ref 0.0–0.1)
Basophils Relative: 1 %
Eosinophils Absolute: 0.1 10*3/uL (ref 0.0–0.5)
Eosinophils Relative: 1 %
HCT: 38.2 % (ref 36.0–46.0)
Hemoglobin: 12.7 g/dL (ref 12.0–15.0)
Immature Granulocytes: 0 %
Lymphocytes Relative: 28 %
Lymphs Abs: 1.6 10*3/uL (ref 0.7–4.0)
MCH: 29.6 pg (ref 26.0–34.0)
MCHC: 33.2 g/dL (ref 30.0–36.0)
MCV: 89 fL (ref 80.0–100.0)
Monocytes Absolute: 0.3 10*3/uL (ref 0.1–1.0)
Monocytes Relative: 5 %
Neutro Abs: 3.6 10*3/uL (ref 1.7–7.7)
Neutrophils Relative %: 65 %
Platelets: 182 10*3/uL (ref 150–400)
RBC: 4.29 MIL/uL (ref 3.87–5.11)
RDW: 13.3 % (ref 11.5–15.5)
WBC: 5.6 10*3/uL (ref 4.0–10.5)
nRBC: 0 % (ref 0.0–0.2)

## 2019-05-04 LAB — BASIC METABOLIC PANEL
Anion gap: 10 (ref 5–15)
BUN: 11 mg/dL (ref 8–23)
CO2: 23 mmol/L (ref 22–32)
Calcium: 9 mg/dL (ref 8.9–10.3)
Chloride: 106 mmol/L (ref 98–111)
Creatinine, Ser: 0.44 mg/dL (ref 0.44–1.00)
GFR calc Af Amer: >60 mL/min (ref >60)
GFR calc non Af Amer: >60 mL/min (ref >60)
Glucose, Bld: 140 mg/dL — ABNORMAL HIGH (ref 70–99)
Potassium: 3.8 mmol/L (ref 3.5–5.1)
Sodium: 139 mmol/L (ref 135–145)

## 2019-05-05 ENCOUNTER — Other Ambulatory Visit: Payer: Self-pay

## 2019-05-05 LAB — CEA: CEA: 0.9 ng/mL (ref 0.0–4.7)

## 2019-05-05 LAB — CANCER ANTIGEN 27.29: CA 27.29: 23.5 U/mL (ref 0.0–38.6)

## 2019-05-05 NOTE — Progress Notes (Deleted)
[facility]  Telephone:(336) [phone] Fax:(336) [phone]  ID: Jamie Martinez OB: 23-Dec-1955  MR#: [id number]  [id number]#:[id number]  Patient Care Team: Maryland Pink, MD as PCP - General (Family Medicine)  I connected with Jamie Martinez on 05/05/19 at 10:15 AM EDT by {Blank single:19197::"video enabled telemedicine visit","telephone visit"} and verified that I am speaking with the correct person using two identifiers.   I discussed the limitations, risks, security and privacy concerns of performing an evaluation and management service by telemedicine and the availability of in-person appointments. I also discussed with the patient that there may be a patient responsible charge related to this service. The patient expressed understanding and agreed to proceed.   Other persons participating in the visit and their role in the encounter: ***   Patient's location: ***  Provider's location: ***   CHIEF COMPLAINT: Stage Ia adenocarcinoma of the upper outer quadrant of the left breast, stage IIa adenocarcinoma of the sigmoid colon with positive margins.  INTERVAL HISTORY: Patient returns to clinic today for routine six-month evaluation.  She continues to tolerate letrozole well without significant side effects.  She currently feels well and is asymptomatic. She has no neurologic complaints.  She denies any recent fevers or illnesses.  She has no chest pain, shortness of breath, cough, or hemoptysis.  She has a good appetite and denies weight loss.  She denies any nausea, vomiting, constipation, or diarrhea.  She has noted no changes in her bowel movements.  She denies any melena or hematochezia.  She has no urinary complaints.  Patient feels at her baseline offers no specific complaints today.  REVIEW OF SYSTEMS:   Review of Systems  Constitutional: Negative.  Negative for fever, malaise/fatigue and weight loss.  Respiratory: Negative.  Negative for cough and shortness of breath.    Cardiovascular: Negative.  Negative for chest pain and leg swelling.  Gastrointestinal: Negative.  Negative for abdominal pain, blood in stool, constipation, diarrhea, melena, nausea and vomiting.  Genitourinary: Negative.  Negative for dysuria.  Musculoskeletal: Negative.  Negative for back pain.  Skin: Negative.  Negative for rash.  Neurological: Negative.  Negative for sensory change, focal weakness, weakness and headaches.  Psychiatric/Behavioral: Negative.  The patient is not nervous/anxious.     As per HPI. Otherwise, a complete review of systems is negative.  PAST MEDICAL HISTORY: Past Medical History:  Diagnosis Date  . Arthritis   . Breast cancer ([facility]) 10/2009   positive/ radiation  . Breast cancer, left ([facility]) 2010   left breast surgery and radiation  . Colon cancer ([facility]) 2013   colon resection   . DVT of leg (deep venous thrombosis) ([facility]) 02-2015   Left leg  . Headache    sinus  . Hypothyroidism   . Wears dentures    full upper    PAST SURGICAL HISTORY: Past Surgical History:  Procedure Laterality Date  . ABDOMINAL HYSTERECTOMY     partial  . APPENDECTOMY  dec 2013  . BREAST BIOPSY Left 2010   +  . BREAST SURGERY Left 2010   lumpectomy done, lymph nodes removed   . COLON SURGERY  dec 2013   surgery for colon cancer, part of bladder, part of pelvic wall removed appendix removed 1 ovary removed   . COLONOSCOPY WITH PROPOFOL N/A 11/21/2016   Procedure: COLONOSCOPY WITH PROPOFOL;  Surgeon: Lucilla Lame, MD;  Location: [facility];  Service: Endoscopy;  Laterality: N/A;  . COLONOSCOPY WITH PROPOFOL N/A 11/28/2016   Procedure: COLONOSCOPY WITH  PROPOFOL;  Surgeon: Lucilla Lame, MD;  Location: [facility];  Service: Endoscopy;  Laterality: N/A;  . knee arthroscopy Bilateral   . KNEE CLOSED REDUCTION Bilateral 04/23/2015   Procedure: BILATERAL KNEE CLOSED MANIPULATION;  Surgeon: Gaynelle Arabian, MD;  Location[facility];  Service: Orthopedics;  Laterality:  Bilateral;  . POLYPECTOMY  11/28/2016   Procedure: POLYPECTOMY;  Surgeon: Lucilla Lame, MD;  Location: [facility];  Service: Endoscopy;;  . TOTAL KNEE ARTHROPLASTY Bilateral 03/07/2015   Procedure: BILATERAL TOTAL KNEE ARTHROPLASTY;  Surgeon: Gaynelle Arabian, MD;  [facility];  Service: Orthopedics;  Laterality: Bilateral;  +epidural    FAMILY HISTORY:  Reviewed and unchanged. No reported history of malignancy or chronic disease.     ADVANCED DIRECTIVES:    HEALTH MAINTENANCE: Social History   Tobacco Use  . Smoking status: Former Smoker    Last attempt to quit: 05/24/2012    Years since quitting: 6.9  . Smokeless tobacco: Never Used  Substance Use Topics  . Alcohol use: No  . Drug use: No     Allergies  Allergen Reactions  . Tramadol Nausea Only and Nausea And Vomiting    Current Outpatient Medications  Medication Sig Dispense Refill  . acetaminophen (TYLENOL) 500 MG tablet Take 500 mg by mouth every 6 (six) hours as needed (Pain).    Marland Kitchen letrozole (FEMARA) 2.5 MG tablet Take 1 tablet (2.5 mg total) by mouth daily. 90 tablet 1  . levothyroxine (SYNTHROID, LEVOTHROID) 175 MCG tablet Take 175 mcg by mouth daily before breakfast.    . loratadine (CLARITIN) 10 MG tablet Take 10 mg by mouth daily.    . meloxicam (MOBIC) 15 MG tablet Take 1 tablet (15 mg total) by mouth daily. 30 tablet 3   No current facility-administered medications for this visit.     OBJECTIVE: There were no vitals filed for this visit.   There is no height or weight on file to calculate BMI.    ECOG FS:0 - Asymptomatic  General: Well-developed, well-nourished, no acute distress. Eyes: Pink conjunctiva, anicteric sclera. HEENT: Normocephalic, moist mucous membranes. Lungs: Clear to auscultation bilaterally. Heart: Regular rate and rhythm. No rubs, murmurs, or gallops. Abdomen: Soft, nontender, nondistended. No organomegaly noted, normoactive bowel sounds. Musculoskeletal: No edema, cyanosis, or  clubbing. Neuro: Alert, answering all questions appropriately. Cranial nerves grossly intact. Skin: No rashes or petechiae noted. Psych: Normal affect.  LAB RESULTS:  Lab Results  Component Value Date   NA 139 05/04/2019   K 3.8 05/04/2019   CL 106 05/04/2019   CO2 23 05/04/2019   GLUCOSE 140 (H) 05/04/2019   BUN 11 05/04/2019   CREATININE 0.44 05/04/2019   CALCIUM 9.0 05/04/2019   PROT 5.5 (L) 03/12/2015   ALBUMIN 2.6 (L) 03/12/2015   AST 19 03/12/2015   ALT 15 03/12/2015   ALKPHOS 59 03/12/2015   BILITOT 0.7 03/12/2015   GFRNONAA >60 05/04/2019   GFRAA >60 05/04/2019    Lab Results  Component Value Date   WBC 5.6 05/04/2019   NEUTROABS 3.6 05/04/2019   HGB 12.7 05/04/2019   HCT 38.2 05/04/2019   MCV 89.0 05/04/2019   PLT 182 05/04/2019   Lab Results  Component Value Date   LABCA2 19.0 05/01/2017   Lab Results  Component Value Date   CEA 1.3 05/01/2017     STUDIES: No results found.  ASSESSMENT: Stage Ia adenocarcinoma of the upper outer quadrant of the left breast, stage IIa adenocarcinoma of the sigmoid colon with positive  margins.  PLAN:    1.  Stage Ia adenocarcinoma of the upper outer quadrant of the left breast: No evidence of disease. Continue letrozole completing 5 years of treatment in August of 2020.  Her most recent mammogram on June 01, 2018 was reported as BI-RADS 1.  Repeat in June 2020.  Repeat bone mineral density on May 05/2018 reported T score of -0.7 which continues to be within normal limits.  Repeat in May 2021.  Return to clinic in 6 months for routine evaluation.   2.  Stage IIa adenocarcinoma of the sigmoid colon with positive margins:  Although patient's stage IIa, she was noted to have positive margins on her colectomy specimen on December 09, 2012.  She declined reexcision at that time.  Because of this, she received 12 cycles of adjuvant FOLFOX completing in approximately July 2014. CT scan results from May 01, 2017 reviewed  independently with no obvious evidence of recurrence or metastatic disease.  CEA continues to be within normal limits at 1.2. Patient's most recent colonoscopy in December 2017 removed 2 polyps, but otherwise was within normal limits.  Her next colonoscopy will be due in approximately December 2020. No further imaging is needed at this time.  Return to clinic in 6 months as above.   I provided *** minutes of {Blank single:19197::"face-to-face video visit time","non face-to-face telephone visit time"} during this encounter, and > 50% was spent counseling as documented under my assessment & plan.  Patient expressed understanding and was in agreement with this plan. She also understands that She can call clinic at any time with any questions, concerns, or complaints.    Lloyd Huger, MD   05/05/2019 6:40 AM

## 2019-05-06 ENCOUNTER — Inpatient Hospital Stay: Payer: Managed Care, Other (non HMO) | Admitting: Oncology

## 2019-05-07 NOTE — Progress Notes (Signed)
[place]  [phone] [phone]) [phone]  ID: ABAGAYLE KLUTTS OB: [birth year]  MR#: [id number]  YTK#:[id number]  Patient Care Team: Maryland Pink, MD as PCP - General (Family Medicine)  I connected with Eugenie Birks on 05/12/19 at  8:45 AM EDT by telephone visit and verified that I am speaking with the correct person using two identifiers.   I discussed the limitations, risks, security and privacy concerns of performing an evaluation and management service by telemedicine and the availability of in-person appointments. I also discussed with the patient that there may be a patient responsible charge related to this service. The patient expressed understanding and agreed to proceed.   Other persons participating in the visit and their role in the encounter: Patient, MD  Patient's location: Home Provider's location: Clinic  CHIEF COMPLAINT: Stage Ia adenocarcinoma of the upper outer quadrant of the left breast, stage IIa adenocarcinoma of the sigmoid colon with positive margins.  INTERVAL HISTORY: Patient agreed to telephone visit for her routine 26-month evaluation.  She currently feels well and is asymptomatic.  She is tolerating letrozole without significant side effects.  She has no neurologic complaints.  She denies any recent fevers or illnesses.  She has no chest pain, shortness of breath, cough, or hemoptysis.  She has a good appetite and denies weight loss.  She denies any nausea, vomiting, constipation, or diarrhea.  She has noted no changes in her bowel movements.  She denies any melena or hematochezia.  She has no urinary complaints.  Patient feels at her baseline offers no specific complaints today.  REVIEW OF SYSTEMS:   Review of Systems  Constitutional: Negative.  Negative for fever, malaise/fatigue and weight loss.  Respiratory: Negative.  Negative for cough and shortness of breath.   Cardiovascular: Negative.  Negative for chest pain and leg  swelling.  Gastrointestinal: Negative.  Negative for abdominal pain, blood in stool, constipation, diarrhea, melena, nausea and vomiting.  Genitourinary: Negative.  Negative for dysuria.  Musculoskeletal: Negative.  Negative for back pain.  Skin: Negative.  Negative for rash.  Neurological: Negative.  Negative for sensory change, focal weakness, weakness and headaches.  Psychiatric/Behavioral: Negative.  The patient is not nervous/anxious.     As per HPI. Otherwise, a complete review of systems is negative.  PAST MEDICAL HISTORY: Past Medical History:  Diagnosis Date  . Arthritis   . Breast cancer ([place]) 10/2009   positive/ radiation  . Breast cancer, left ([place]) 2010   left breast surgery and radiation  . Colon cancer ([place]) 2013   colon resection   . DVT of leg (deep venous thrombosis) ([facility]) 02-2015   Left leg  . Headache    sinus  . Hypothyroidism   . Wears dentures    full upper    PAST SURGICAL HISTORY: Past Surgical History:  Procedure Laterality Date  . ABDOMINAL HYSTERECTOMY     partial  . APPENDECTOMY  dec 2013  . BREAST BIOPSY Left 2010   +  . BREAST SURGERY Left 2010   lumpectomy done, lymph nodes removed   . COLON SURGERY  dec 2013   surgery for colon cancer, part of bladder, part of pelvic wall removed appendix removed 1 ovary removed   . COLONOSCOPY WITH PROPOFOL N/A 11/21/2016   Procedure: COLONOSCOPY WITH PROPOFOL;  Surgeon: Lucilla Lame, MD;  Location: [facility];  Service: Endoscopy;  Laterality: N/A;  . COLONOSCOPY WITH PROPOFOL N/A 11/28/2016   Procedure: COLONOSCOPY WITH PROPOFOL;  Surgeon: Lucilla Lame,  MD;  Location: [facility];  Service: Endoscopy;  Laterality: N/A;  . knee arthroscopy Bilateral   . KNEE CLOSED REDUCTION Bilateral 04/23/2015   Procedure: BILATERAL KNEE CLOSED MANIPULATION;  Surgeon: Gaynelle Arabian, MD;  Location: [facility];  Service: Orthopedics;  Laterality: Bilateral;  . POLYPECTOMY  11/28/2016   Procedure: POLYPECTOMY;   Surgeon: Lucilla Lame, MD;  Location: [facility];  Service: Endoscopy;;  . TOTAL KNEE ARTHROPLASTY Bilateral 03/07/2015   Procedure: BILATERAL TOTAL KNEE ARTHROPLASTY;  Surgeon: Gaynelle Arabian, MD;  Location: [facility];  Service: Orthopedics;  Laterality: Bilateral;  +epidural    FAMILY HISTORY:  Reviewed and unchanged. No reported history of malignancy or chronic disease.     ADVANCED DIRECTIVES:    HEALTH MAINTENANCE: Social History   Tobacco Use  . Smoking status: Former Smoker    Last attempt to quit: 05/24/2012    Years since quitting: 6.9  . Smokeless tobacco: Never Used  Substance Use Topics  . Alcohol use: No  . Drug use: No     Allergies  Allergen Reactions  . Tramadol Nausea Only and Nausea And Vomiting    Current Outpatient Medications  Medication Sig Dispense Refill  . acetaminophen (TYLENOL) 500 MG tablet Take 500 mg by mouth every 6 (six) hours as needed (Pain).    Marland Kitchen letrozole (FEMARA) 2.5 MG tablet Take 1 tablet (2.5 mg total) by mouth daily. 90 tablet 1  . levothyroxine (SYNTHROID, LEVOTHROID) 175 MCG tablet Take 175 mcg by mouth daily before breakfast.    . losartan (COZAAR) 50 MG tablet Take by mouth.    . meloxicam (MOBIC) 15 MG tablet Take 1 tablet (15 mg total) by mouth daily. 30 tablet 3   No current facility-administered medications for this visit.     OBJECTIVE: There were no vitals filed for this visit.   There is no height or weight on file to calculate BMI.    ECOG FS:0 - Asymptomatic   LAB RESULTS:  Lab Results  Component Value Date   NA 139 05/04/2019   K 3.8 05/04/2019   CL 106 05/04/2019   CO2 23 05/04/2019   GLUCOSE 140 (H) 05/04/2019   BUN 11 05/04/2019   CREATININE 0.44 05/04/2019   CALCIUM 9.0 05/04/2019   PROT 5.5 (L) 03/12/2015   ALBUMIN 2.6 (L) 03/12/2015   AST 19 03/12/2015   ALT 15 03/12/2015   ALKPHOS 59 03/12/2015   BILITOT 0.7 03/12/2015   GFRNONAA >60 05/04/2019   GFRAA >60 05/04/2019    Lab Results   Component Value Date   WBC 5.6 05/04/2019   NEUTROABS 3.6 05/04/2019   HGB 12.7 05/04/2019   HCT 38.2 05/04/2019   MCV 89.0 05/04/2019   PLT 182 05/04/2019     STUDIES: No results found.  ASSESSMENT: Stage Ia adenocarcinoma of the upper outer quadrant of the left breast, stage IIa adenocarcinoma of the sigmoid colon with positive margins.  PLAN:    1.  Stage Ia adenocarcinoma of the upper outer quadrant of the left breast: No evidence of disease.  Her CA 27-29 continues to be within normal limits at 23.5.  Continue letrozole completing 5 years of treatment in August of 2020.  Her most recent mammogram on June 01, 2018 was reported as BI-RADS 1.  Repeat in June 2020.  Return to clinic in 6 months for routine evaluation. 2.  Stage IIa adenocarcinoma of the sigmoid colon with positive margins:  Although patient's stage IIa, she was noted to have positive  margins on her colectomy specimen on December 09, 2012.  She declined reexcision at that time.  Because of this, she received 12 cycles of adjuvant FOLFOX completing in approximately July 2014. CT scan results from May 01, 2017 reviewed independently with no obvious evidence of recurrence or metastatic disease.  CEA continues to be within normal limits at 0.9.  Her most recent colonoscopy in December 2017 removed 2 polyps, but otherwise was within normal limits.  Her next colonoscopy will be due in approximately December 2020. No further imaging is needed at this time.  Return to clinic in 6 months as above. 3.  Bone health: Bone mineral density on Apr 26, 2018 reported T score of -0.7 which is considered normal.  Repeat in May 2021.    I provided 15 minutes of non face-to-face telephone visit time during this encounter, and > 50% was spent counseling as documented under my assessment & plan.  Patient expressed understanding and was in agreement with this plan. She also understands that She can call clinic at any time with any questions,  concerns, or complaints.    Lloyd Huger, MD   05/12/2019 6:48 AM

## 2019-05-10 ENCOUNTER — Other Ambulatory Visit: Payer: Self-pay

## 2019-05-10 ENCOUNTER — Inpatient Hospital Stay (HOSPITAL_BASED_OUTPATIENT_CLINIC_OR_DEPARTMENT_OTHER): Payer: Managed Care, Other (non HMO) | Admitting: Oncology

## 2019-05-10 DIAGNOSIS — Z79899 Other long term (current) drug therapy: Secondary | ICD-10-CM

## 2019-05-10 DIAGNOSIS — C187 Malignant neoplasm of sigmoid colon: Secondary | ICD-10-CM | POA: Diagnosis not present

## 2019-05-10 DIAGNOSIS — E039 Hypothyroidism, unspecified: Secondary | ICD-10-CM

## 2019-05-10 DIAGNOSIS — Z79811 Long term (current) use of aromatase inhibitors: Secondary | ICD-10-CM

## 2019-05-10 DIAGNOSIS — C50412 Malignant neoplasm of upper-outer quadrant of left female breast: Secondary | ICD-10-CM

## 2019-05-10 DIAGNOSIS — C772 Secondary and unspecified malignant neoplasm of intra-abdominal lymph nodes: Secondary | ICD-10-CM | POA: Diagnosis not present

## 2019-05-10 DIAGNOSIS — Z87891 Personal history of nicotine dependence: Secondary | ICD-10-CM

## 2019-05-10 NOTE — Progress Notes (Signed)
Patient denies any concerns today.  

## 2019-11-05 NOTE — Progress Notes (Signed)
[place]  Telephone:(336) [phone] Fax:(336) [phone]  ID: Jamie Martinez OB: 1956-05-14  MR#: [id number]  [id number]  Patient Care Team: Maryland Pink, MD as PCP - General (Family Medicine)  CHIEF COMPLAINT: Stage Ia adenocarcinoma of the upper outer quadrant of the left breast, stage IIa adenocarcinoma of the sigmoid colon with positive margins.  INTERVAL HISTORY: Patient returns to clinic today for laboratory work and routine 54-month evaluation.  She continues to feel well and remains asymptomatic.  She has no neurologic complaints.  She denies any recent fevers or illnesses.  She has no chest pain, shortness of breath, cough, or hemoptysis.  She has a good appetite and denies weight loss.  She denies any nausea, vomiting, constipation, or diarrhea.  She has noted no changes in her bowel movements.  She denies any melena or hematochezia.  She has no urinary complaints.  Patient feels at her baseline offers no specific complaints today.  REVIEW OF SYSTEMS:   Review of Systems  Constitutional: Negative.  Negative for fever, malaise/fatigue and weight loss.  Respiratory: Negative.  Negative for cough and shortness of breath.   Cardiovascular: Negative.  Negative for chest pain and leg swelling.  Gastrointestinal: Negative.  Negative for abdominal pain, blood in stool, constipation, diarrhea, melena, nausea and vomiting.  Genitourinary: Negative.  Negative for dysuria.  Musculoskeletal: Negative.  Negative for back pain.  Skin: Negative.  Negative for rash.  Neurological: Negative.  Negative for sensory change, focal weakness, weakness and headaches.  Psychiatric/Behavioral: Negative.  The patient is not nervous/anxious.     As per HPI. Otherwise, a complete review of systems is negative.  PAST MEDICAL HISTORY: Past Medical History:  Diagnosis Date  . Arthritis   . Breast cancer ([place]) 10/2009   positive/ radiation  . Breast cancer, left ([place]) 2010   left  breast surgery and radiation  . Colon cancer ([place]) 2013   colon resection   . DVT of leg (deep venous thrombosis) ([facility]) 02-2015   Left leg  . Headache    sinus  . Hypothyroidism   . Wears dentures    full upper    PAST SURGICAL HISTORY: Past Surgical History:  Procedure Laterality Date  . ABDOMINAL HYSTERECTOMY     partial  . APPENDECTOMY  dec 2013  . BREAST BIOPSY Left 2010   +  . BREAST SURGERY Left 2010   lumpectomy done, lymph nodes removed   . COLON SURGERY  dec 2013   surgery for colon cancer, part of bladder, part of pelvic wall removed appendix removed 1 ovary removed   . COLONOSCOPY WITH PROPOFOL N/A 11/21/2016   Procedure: COLONOSCOPY WITH PROPOFOL;  Surgeon: Lucilla Lame, MD;  Location: [facility];  Service: Endoscopy;  Laterality: N/A;  . COLONOSCOPY WITH PROPOFOL N/A 11/28/2016   Procedure: COLONOSCOPY WITH PROPOFOL;  Surgeon: Lucilla Lame, MD;  Location: [facility];  Service: Endoscopy;  Laterality: N/A;  . knee arthroscopy Bilateral   . KNEE CLOSED REDUCTION Bilateral 04/23/2015   Procedure: BILATERAL KNEE CLOSED MANIPULATION;  Surgeon: Gaynelle Arabian, MD;  Location[facility];  Service: Orthopedics;  Laterality: Bilateral;  . POLYPECTOMY  11/28/2016   Procedure: POLYPECTOMY;  Surgeon: Lucilla Lame, MD;  Location: [facility];  Service: Endoscopy;;  . TOTAL KNEE ARTHROPLASTY Bilateral 03/07/2015   Procedure: BILATERAL TOTAL KNEE ARTHROPLASTY;  Surgeon: Gaynelle Arabian, MD;  Location[facility];  Service: Orthopedics;  Laterality: Bilateral;  +epidural    FAMILY HISTORY:  Reviewed and unchanged. No reported  history of malignancy or chronic disease.     ADVANCED DIRECTIVES:    HEALTH MAINTENANCE: Social History   Tobacco Use  . Smoking status: Former Smoker    Quit date: 05/24/2012    Years since quitting: 7.4  . Smokeless tobacco: Never Used  Substance Use Topics  . Alcohol use: No  . Drug use: No     Allergies  Allergen Reactions  .  Tramadol Nausea Only and Nausea And Vomiting    Current Outpatient Medications  Medication Sig Dispense Refill  . acetaminophen (TYLENOL) 500 MG tablet Take 500 mg by mouth every 6 (six) hours as needed (Pain).    Marland Kitchen letrozole (FEMARA) 2.5 MG tablet Take 1 tablet (2.5 mg total) by mouth daily. 90 tablet 1  . levothyroxine (SYNTHROID, LEVOTHROID) 175 MCG tablet Take 175 mcg by mouth daily before breakfast.    . losartan (COZAAR) 100 MG tablet Take 100 mg by mouth daily.    . meloxicam (MOBIC) 15 MG tablet Take 1 tablet (15 mg total) by mouth daily. 30 tablet 3   No current facility-administered medications for this visit.     OBJECTIVE: Vitals:   11/10/19 1034  BP: (!) 153/94  Resp: 16  Temp: (!) 97.4 F (36.3 C)  SpO2: 99%     Body mass index is 30.57 kg/m.    ECOG FS:0 - Asymptomatic  General: Well-developed, well-nourished, no acute distress. Eyes: Pink conjunctiva, anicteric sclera. HEENT: Normocephalic, moist mucous membranes. Breast: Patient declined exam today. Lungs: Clear to auscultation bilaterally. Heart: Regular rate and rhythm. No rubs, murmurs, or gallops. Abdomen: Soft, nontender, nondistended. No organomegaly noted, normoactive bowel sounds. Musculoskeletal: No edema, cyanosis, or clubbing. Neuro: Alert, answering all questions appropriately. Cranial nerves grossly intact. Skin: No rashes or petechiae noted. Psych: Normal affect.  LAB RESULTS:  Lab Results  Component Value Date   NA 138 11/10/2019   K 4.0 11/10/2019   CL 105 11/10/2019   CO2 27 11/10/2019   GLUCOSE 104 (H) 11/10/2019   BUN 21 11/10/2019   CREATININE 0.47 11/10/2019   CALCIUM 9.2 11/10/2019   PROT 5.5 (L) 03/12/2015   ALBUMIN 2.6 (L) 03/12/2015   AST 19 03/12/2015   ALT 15 03/12/2015   ALKPHOS 59 03/12/2015   BILITOT 0.7 03/12/2015   GFRNONAA >60 11/10/2019   GFRAA >60 11/10/2019    Lab Results  Component Value Date   WBC 6.8 11/10/2019   NEUTROABS 4.4 11/10/2019   HGB 12.4  11/10/2019   HCT 38.1 11/10/2019   MCV 89.9 11/10/2019   PLT 198 11/10/2019     STUDIES: No results found.  ASSESSMENT: Stage Ia adenocarcinoma of the upper outer quadrant of the left breast, stage IIa adenocarcinoma of the sigmoid colon with positive margins.  PLAN:    1.  Stage Ia adenocarcinoma of the upper outer quadrant of the left breast: No evidence of disease.  CA 27-29 continues to be within normal limits at 16.7.  Patient completed 5 years of letrozole in August 2020. Her most recent mammogram on June 01, 2018 was reported as BI-RADS 1 repeat in the next 1 to 2 weeks.  Return to clinic in 1 year for routine evaluation. 2.  Stage IIa adenocarcinoma of the sigmoid colon with positive margins:  Although patient's stage IIa, she was noted to have positive margins on her colectomy specimen on December 09, 2012.  She declined reexcision at that time.  Because of this, she received 12 cycles of adjuvant FOLFOX completing in  approximately July 2014. CT scan results from May 01, 2017 reviewed independently with no obvious evidence of recurrence or metastatic disease.  CEA continues to be within normal limits at 0.9.  Her most recent colonoscopy in December 2017 removed 2 polyps, but otherwise was within normal limits.  Have referred patient to GI for repeat colonoscopy.  Return to clinic in 1 year as above. 3.  Bone health: Bone mineral density on Apr 26, 2018 reported T score of -0.7 which is considered normal.  Continue monitoring per primary care.  I spent a total of 15 minutes face-to-face with the patient of which greater than 50% of the visit was spent in counseling and coordination of care as detailed above.  Patient expressed understanding and was in agreement with this plan. She also understands that She can call clinic at any time with any questions, concerns, or complaints.    Lloyd Huger, MD   11/11/2019 6:28 AM

## 2019-11-09 ENCOUNTER — Other Ambulatory Visit: Payer: Self-pay

## 2019-11-10 ENCOUNTER — Telehealth: Payer: Self-pay

## 2019-11-10 ENCOUNTER — Inpatient Hospital Stay: Payer: 59 | Attending: Oncology

## 2019-11-10 ENCOUNTER — Other Ambulatory Visit: Payer: Self-pay

## 2019-11-10 ENCOUNTER — Inpatient Hospital Stay (HOSPITAL_BASED_OUTPATIENT_CLINIC_OR_DEPARTMENT_OTHER): Payer: 59 | Admitting: Oncology

## 2019-11-10 VITALS — BP 153/94 | Temp 97.4°F | Resp 16 | Wt 207.0 lb

## 2019-11-10 DIAGNOSIS — Z79811 Long term (current) use of aromatase inhibitors: Secondary | ICD-10-CM | POA: Insufficient documentation

## 2019-11-10 DIAGNOSIS — E039 Hypothyroidism, unspecified: Secondary | ICD-10-CM | POA: Insufficient documentation

## 2019-11-10 DIAGNOSIS — Z86718 Personal history of other venous thrombosis and embolism: Secondary | ICD-10-CM | POA: Insufficient documentation

## 2019-11-10 DIAGNOSIS — M1990 Unspecified osteoarthritis, unspecified site: Secondary | ICD-10-CM | POA: Diagnosis not present

## 2019-11-10 DIAGNOSIS — Z170 Estrogen receptor positive status [ER+]: Secondary | ICD-10-CM | POA: Diagnosis not present

## 2019-11-10 DIAGNOSIS — C187 Malignant neoplasm of sigmoid colon: Secondary | ICD-10-CM | POA: Diagnosis present

## 2019-11-10 DIAGNOSIS — C772 Secondary and unspecified malignant neoplasm of intra-abdominal lymph nodes: Secondary | ICD-10-CM | POA: Diagnosis not present

## 2019-11-10 DIAGNOSIS — Z79899 Other long term (current) drug therapy: Secondary | ICD-10-CM | POA: Insufficient documentation

## 2019-11-10 DIAGNOSIS — C50412 Malignant neoplasm of upper-outer quadrant of left female breast: Secondary | ICD-10-CM

## 2019-11-10 DIAGNOSIS — Z923 Personal history of irradiation: Secondary | ICD-10-CM | POA: Insufficient documentation

## 2019-11-10 DIAGNOSIS — Z85038 Personal history of other malignant neoplasm of large intestine: Secondary | ICD-10-CM

## 2019-11-10 DIAGNOSIS — Z791 Long term (current) use of non-steroidal anti-inflammatories (NSAID): Secondary | ICD-10-CM | POA: Diagnosis not present

## 2019-11-10 DIAGNOSIS — Z87891 Personal history of nicotine dependence: Secondary | ICD-10-CM | POA: Diagnosis not present

## 2019-11-10 LAB — CBC WITH DIFFERENTIAL/PLATELET
Abs Immature Granulocytes: 0.02 10*3/uL (ref 0.00–0.07)
Basophils Absolute: 0 10*3/uL (ref 0.0–0.1)
Basophils Relative: 0 %
Eosinophils Absolute: 0.1 10*3/uL (ref 0.0–0.5)
Eosinophils Relative: 1 %
HCT: 38.1 % (ref 36.0–46.0)
Hemoglobin: 12.4 g/dL (ref 12.0–15.0)
Immature Granulocytes: 0 %
Lymphocytes Relative: 28 %
Lymphs Abs: 1.9 10*3/uL (ref 0.7–4.0)
MCH: 29.2 pg (ref 26.0–34.0)
MCHC: 32.5 g/dL (ref 30.0–36.0)
MCV: 89.9 fL (ref 80.0–100.0)
Monocytes Absolute: 0.4 10*3/uL (ref 0.1–1.0)
Monocytes Relative: 6 %
Neutro Abs: 4.4 10*3/uL (ref 1.7–7.7)
Neutrophils Relative %: 65 %
Platelets: 198 10*3/uL (ref 150–400)
RBC: 4.24 MIL/uL (ref 3.87–5.11)
RDW: 13.2 % (ref 11.5–15.5)
WBC: 6.8 10*3/uL (ref 4.0–10.5)
nRBC: 0 % (ref 0.0–0.2)

## 2019-11-10 LAB — BASIC METABOLIC PANEL
Anion gap: 6 (ref 5–15)
BUN: 21 mg/dL (ref 8–23)
CO2: 27 mmol/L (ref 22–32)
Calcium: 9.2 mg/dL (ref 8.9–10.3)
Chloride: 105 mmol/L (ref 98–111)
Creatinine, Ser: 0.47 mg/dL (ref 0.44–1.00)
GFR calc Af Amer: >60 mL/min (ref >60)
GFR calc non Af Amer: >60 mL/min (ref >60)
Glucose, Bld: 104 mg/dL — ABNORMAL HIGH (ref 70–99)
Potassium: 4 mmol/L (ref 3.5–5.1)
Sodium: 138 mmol/L (ref 135–145)

## 2019-11-10 NOTE — Telephone Encounter (Signed)
Gastroenterology Pre-Procedure Review  Request Date: 11/25/19 Requesting Physician: Dr. Allen Norris  PATIENT REVIEW QUESTIONS: The patient responded to the following health history questions as indicated:    1. Are you having any GI issues? no 2. Do you have a personal history of Polyps? yes 3. Do you have a family history of Colon Cancer or Polyps? no 4. Diabetes Mellitus? no 5. Joint replacements in the past 12 months?no 6. Major health problems in the past 3 months?no 7. Any artificial heart valves, MVP, or defibrillator?no    MEDICATIONS & ALLERGIES:    Patient reports the following regarding taking any anticoagulation/antiplatelet therapy:   Plavix, Coumadin, Eliquis, Xarelto, Lovenox, Pradaxa, Brilinta, or Effient? no Aspirin? no  Patient confirms/reports the following medications:  Current Outpatient Medications  Medication Sig Dispense Refill  . acetaminophen (TYLENOL) 500 MG tablet Take 500 mg by mouth every 6 (six) hours as needed (Pain).    Marland Kitchen letrozole (FEMARA) 2.5 MG tablet Take 1 tablet (2.5 mg total) by mouth daily. 90 tablet 1  . levothyroxine (SYNTHROID, LEVOTHROID) 175 MCG tablet Take 175 mcg by mouth daily before breakfast.    . losartan (COZAAR) 100 MG tablet Take 100 mg by mouth daily.    . meloxicam (MOBIC) 15 MG tablet Take 1 tablet (15 mg total) by mouth daily. 30 tablet 3   No current facility-administered medications for this visit.     Patient confirms/reports the following allergies:  Allergies  Allergen Reactions  . Tramadol Nausea Only and Nausea And Vomiting    No orders of the defined types were placed in this encounter.   AUTHORIZATION INFORMATION Primary Insurance: 1D#: Group #:  Secondary Insurance: 1D#: Group #:  SCHEDULE INFORMATION: Date: Friday 11/25/19 Time: Location:[facility]

## 2019-11-10 NOTE — Progress Notes (Signed)
Patient is here for follow up she is doing well no complaints  

## 2019-11-11 LAB — CANCER ANTIGEN 27.29: CA 27.29: 16.7 U/mL (ref 0.0–38.6)

## 2019-11-11 LAB — CEA: CEA: 0.9 ng/mL (ref 0.0–4.7)

## 2019-11-14 ENCOUNTER — Telehealth: Payer: Self-pay | Admitting: Gastroenterology

## 2019-11-14 NOTE — Telephone Encounter (Signed)
Pt left vm to reschedule her procedure until after Christmas please call pt

## 2019-11-14 NOTE — Telephone Encounter (Signed)
Returned patients call to reschedule her colonoscopy.  I've asked her to call the office back to reschedule her colonoscopy after the holidays due to we're not scheduling for January at this time.  Thanks [employer]

## 2019-11-25 ENCOUNTER — Ambulatory Visit: Admit: 2019-11-25 | Payer: 59 | Admitting: Gastroenterology

## 2019-11-25 SURGERY — COLONOSCOPY WITH PROPOFOL
Anesthesia: General

## 2020-02-23 ENCOUNTER — Ambulatory Visit
Admission: RE | Admit: 2020-02-23 | Discharge: 2020-02-23 | Disposition: A | Discharge status: Home / Self Care | Payer: 59 | Source: Ambulatory Visit | Attending: Oncology | Admitting: Oncology

## 2020-02-23 DIAGNOSIS — Z853 Personal history of malignant neoplasm of breast: Secondary | ICD-10-CM | POA: Insufficient documentation

## 2020-02-23 DIAGNOSIS — C50412 Malignant neoplasm of upper-outer quadrant of left female breast: Secondary | ICD-10-CM

## 2020-02-23 DIAGNOSIS — Z1231 Encounter for screening mammogram for malignant neoplasm of breast: Secondary | ICD-10-CM | POA: Insufficient documentation

## 2020-02-23 HISTORY — DX: Personal history of antineoplastic chemotherapy: Z92.21

## 2020-05-18 IMAGING — MG DIGITAL SCREENING BILAT W/ TOMO W/ CAD
6 of 10 series · 6 of 30 positions shown · non-contrast
Comparison: Previous exam(s).

CLINICAL DATA: Screening.

EXAM:
DIGITAL SCREENING BILATERAL MAMMOGRAM WITH TOMO AND CAD

[R MLO synth-2D]
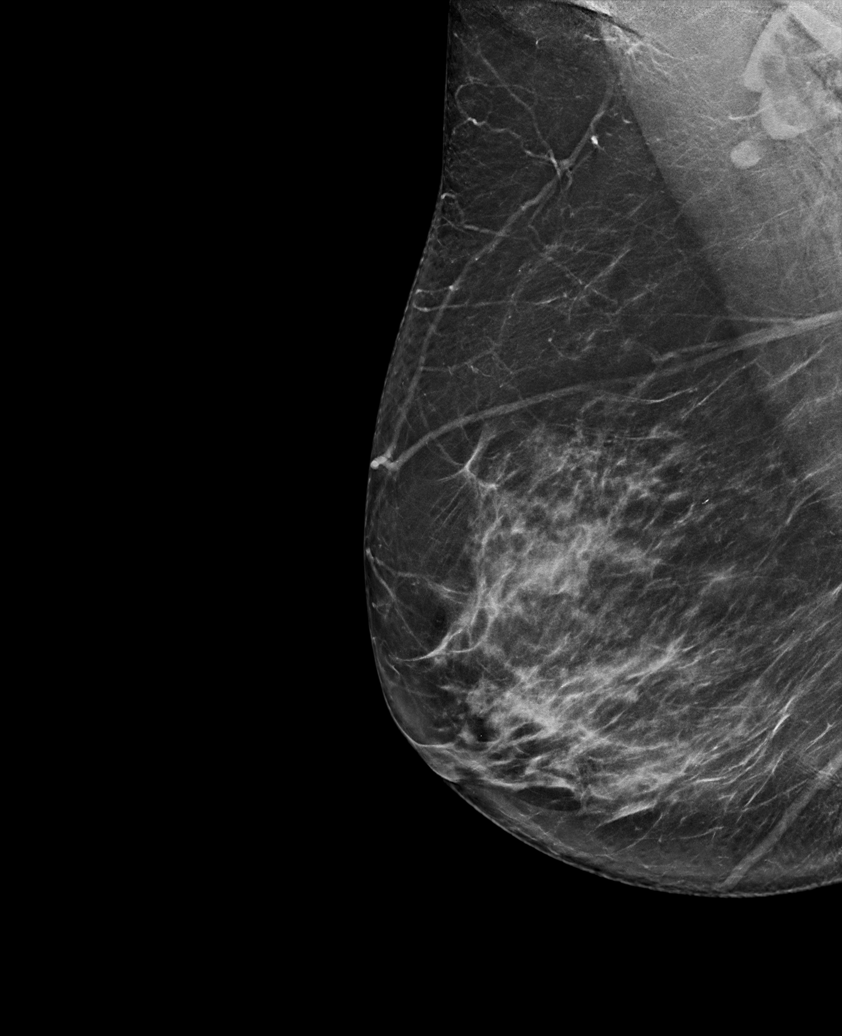

[L CC synth-2D]
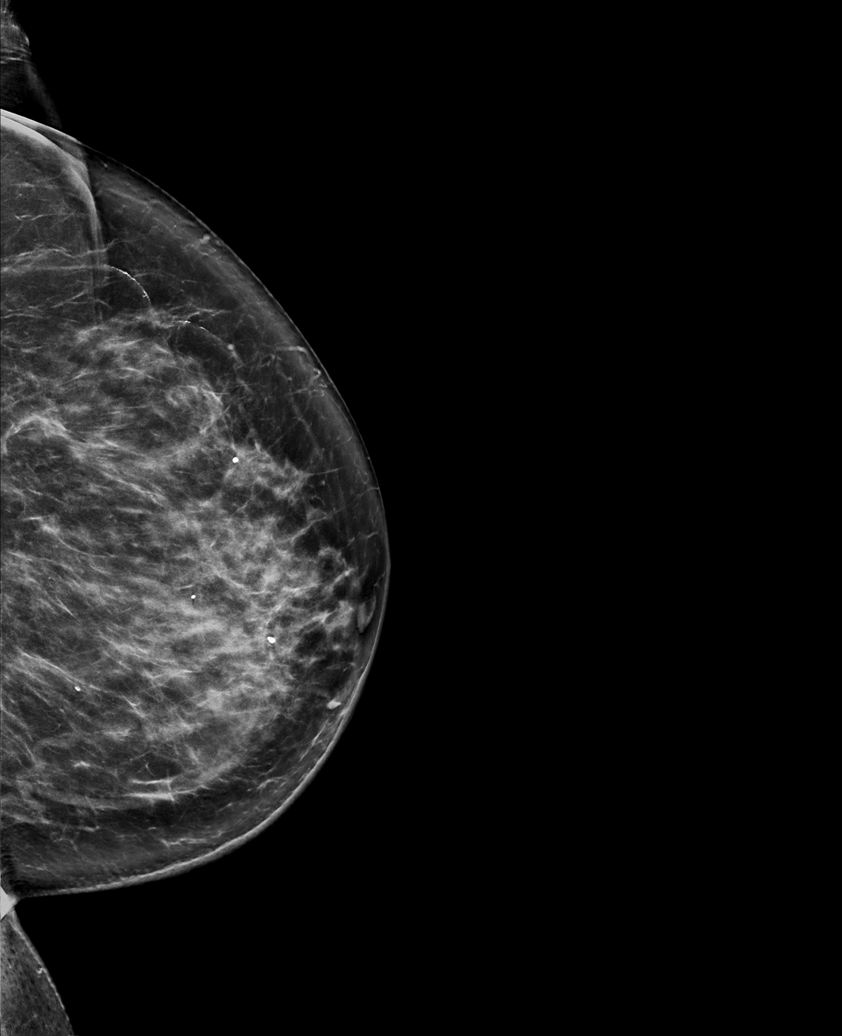

[L XCCL synth-2D]
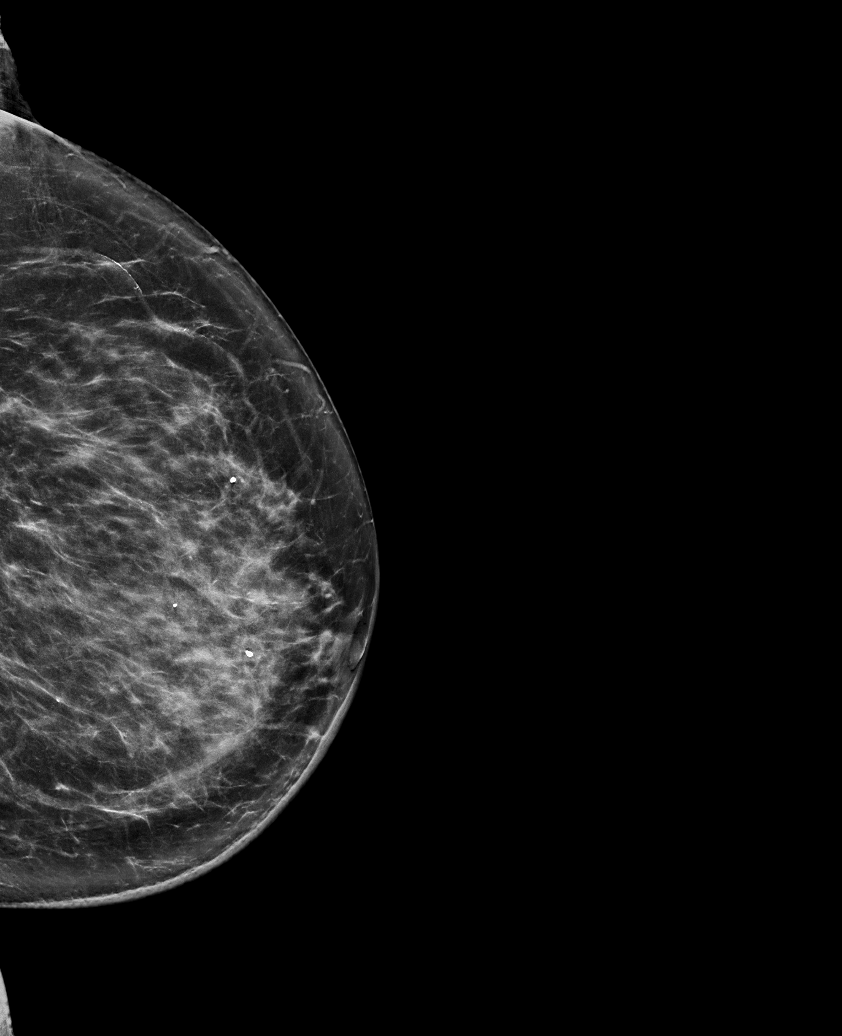

[L MLO synth-2D]
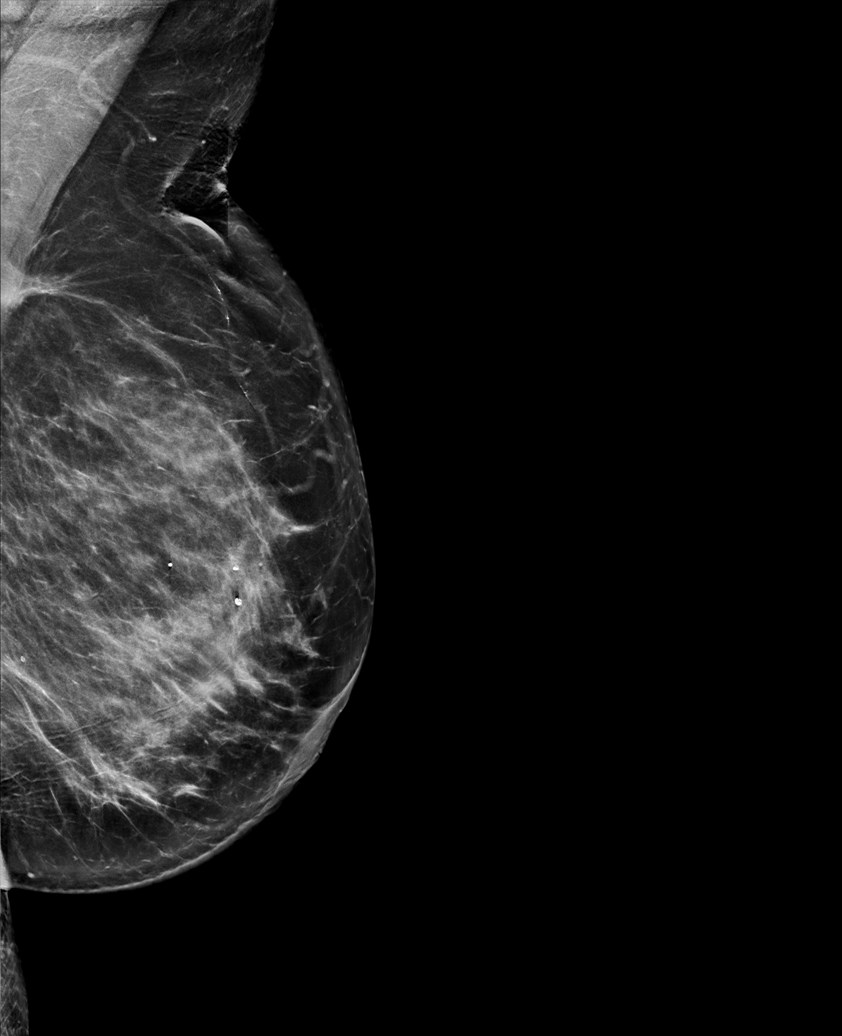

[R CC synth-2D]
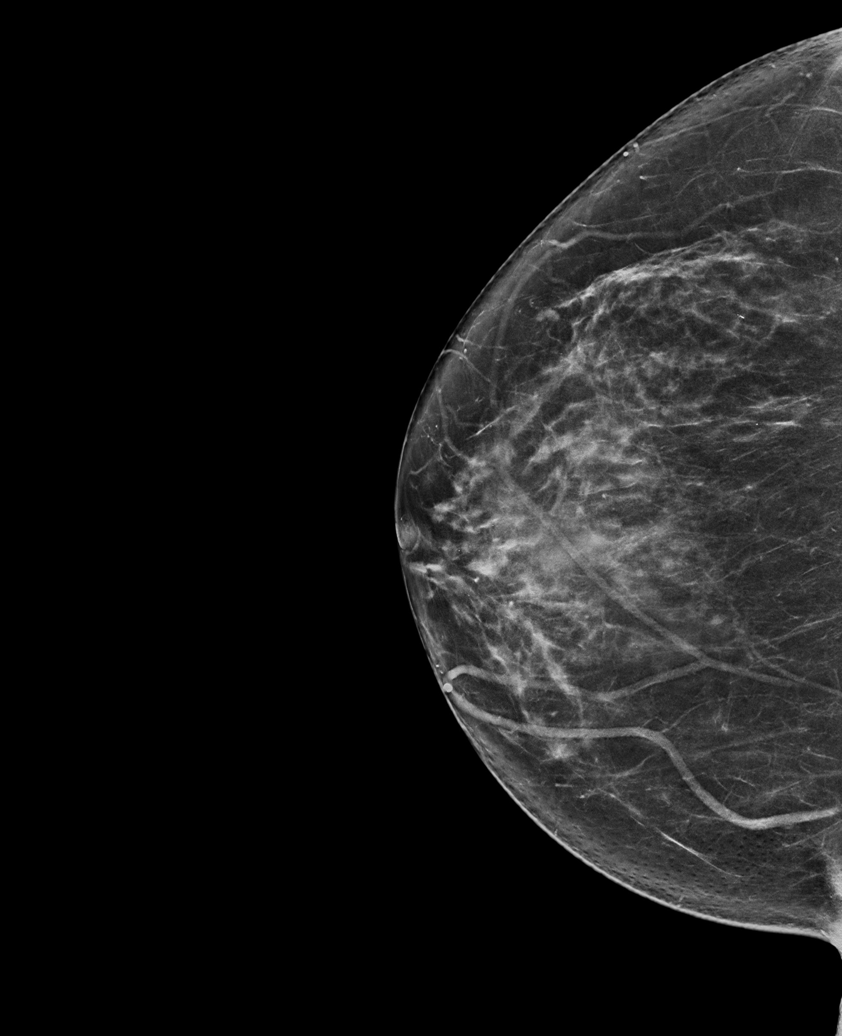

[L MLO tomo · tomo slice 47/92.0]
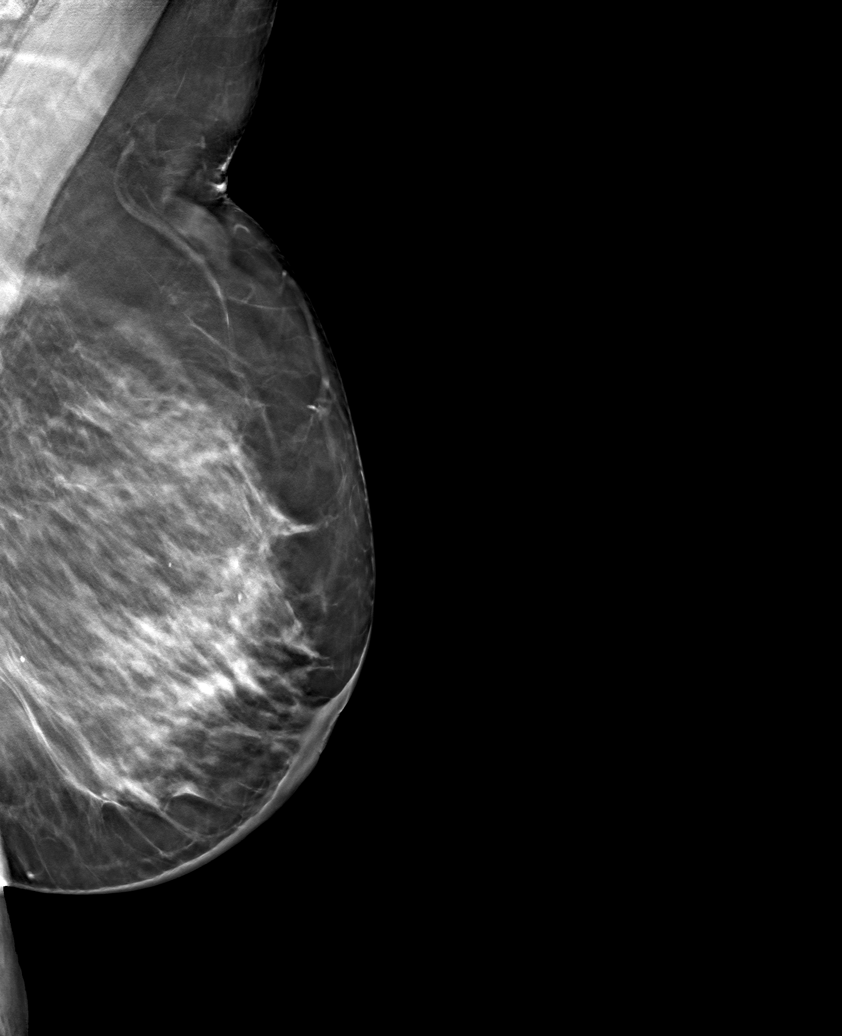

[6 of 30 positions shown; findings below may reference images not displayed]

ACR Breast Density Category c: The breast tissue is heterogeneously
dense, which may obscure small masses.
FINDINGS: There are no findings suspicious for malignancy. Images were
processed with CAD.
IMPRESSION: No mammographic evidence of malignancy. A result letter of this
screening mammogram will be mailed directly to the patient.

RECOMMENDATION:
Screening mammogram in one year. (Code:[id number]-U-LHB)

BI-RADS CATEGORY  1: Negative.

## 2020-11-06 ENCOUNTER — Inpatient Hospital Stay: Payer: 59 | Admitting: Oncology

## 2020-11-06 ENCOUNTER — Inpatient Hospital Stay: Payer: 59

## 2020-12-01 NOTE — Progress Notes (Deleted)
[place]  [phone] [phone]) [phone]  ID: Jamie Martinez OB: [birth year]  MR#: [id number]  ZJI#:[id number]  Patient Care Team: Maryland Pink, MD as PCP - General (Family Medicine)  CHIEF COMPLAINT: Stage Ia adenocarcinoma of the upper outer quadrant of the left breast, stage IIa adenocarcinoma of the sigmoid colon with positive margins.  INTERVAL HISTORY: Patient returns to clinic today for laboratory work and routine 47-month evaluation.  She continues to feel well and remains asymptomatic.  She has no neurologic complaints.  She denies any recent fevers or illnesses.  She has no chest pain, shortness of breath, cough, or hemoptysis.  She has a good appetite and denies weight loss.  She denies any nausea, vomiting, constipation, or diarrhea.  She has noted no changes in her bowel movements.  She denies any melena or hematochezia.  She has no urinary complaints.  Patient feels at her baseline offers no specific complaints today.  REVIEW OF SYSTEMS:   Review of Systems  Constitutional: Negative.  Negative for fever, malaise/fatigue and weight loss.  Respiratory: Negative.  Negative for cough and shortness of breath.   Cardiovascular: Negative.  Negative for chest pain and leg swelling.  Gastrointestinal: Negative.  Negative for abdominal pain, blood in stool, constipation, diarrhea, melena, nausea and vomiting.  Genitourinary: Negative.  Negative for dysuria.  Musculoskeletal: Negative.  Negative for back pain.  Skin: Negative.  Negative for rash.  Neurological: Negative.  Negative for sensory change, focal weakness, weakness and headaches.  Psychiatric/Behavioral: Negative.  The patient is not nervous/anxious.     As per HPI. Otherwise, a complete review of systems is negative.  PAST MEDICAL HISTORY: Past Medical History:  Diagnosis Date  . Arthritis   . Breast cancer ([place]) 10/2009   positive/ radiation  . Breast cancer, left ([place]) 2010   left  breast surgery and radiation  . Colon cancer ([place]) 2013   colon resection   . DVT of leg (deep venous thrombosis) ([facility]) 02-2015   Left leg  . Headache    sinus  . Hypothyroidism   . Personal history of chemotherapy    Colon  . Personal history of radiation therapy 2010   Breast  . Wears dentures    full upper    PAST SURGICAL HISTORY: Past Surgical History:  Procedure Laterality Date  . ABDOMINAL HYSTERECTOMY     partial  . APPENDECTOMY  dec 2013  . BREAST BIOPSY Left 2010   +  . BREAST SURGERY Left 2010   lumpectomy done, lymph nodes removed   . COLON SURGERY  dec 2013   surgery for colon cancer, part of bladder, part of pelvic wall removed appendix removed 1 ovary removed   . COLONOSCOPY WITH PROPOFOL N/A 11/21/2016   Procedure: COLONOSCOPY WITH PROPOFOL;  Surgeon: Lucilla Lame, MD;  Location: [place];  Service: Endoscopy;  Laterality: N/A;  . COLONOSCOPY WITH PROPOFOL N/A 11/28/2016   Procedure: COLONOSCOPY WITH PROPOFOL;  Surgeon: Lucilla Lame, MD;  Location: [place];  Service: Endoscopy;  Laterality: N/A;  . knee arthroscopy Bilateral   . KNEE CLOSED REDUCTION Bilateral 04/23/2015   Procedure: BILATERAL KNEE CLOSED MANIPULATION;  Surgeon: Gaynelle Arabian, MD;  Location: [facility];  Service: Orthopedics;  Laterality: Bilateral;  . POLYPECTOMY  11/28/2016   Procedure: POLYPECTOMY;  Surgeon: Lucilla Lame, MD;  Location: [place];  Service: Endoscopy;;  . TOTAL KNEE ARTHROPLASTY Bilateral 03/07/2015   Procedure: BILATERAL TOTAL KNEE ARTHROPLASTY;  Surgeon: Gaynelle Arabian, MD;  Location:  [facility];  Service: Orthopedics;  Laterality: Bilateral;  +epidural    FAMILY HISTORY:  Reviewed and unchanged. No reported history of malignancy or chronic disease.     ADVANCED DIRECTIVES:    HEALTH MAINTENANCE: Social History   Tobacco Use  . Smoking status: Former Smoker    Quit date: 05/24/2012    Years since quitting: 8.5  . Smokeless tobacco: Never Used   Substance Use Topics  . Alcohol use: No  . Drug use: No     Allergies  Allergen Reactions  . Tramadol Nausea Only and Nausea And Vomiting    Current Outpatient Medications  Medication Sig Dispense Refill  . acetaminophen (TYLENOL) 500 MG tablet Take 500 mg by mouth every 6 (six) hours as needed (Pain).    Marland Kitchen letrozole (FEMARA) 2.5 MG tablet Take 1 tablet (2.5 mg total) by mouth daily. 90 tablet 1  . levothyroxine (SYNTHROID, LEVOTHROID) 175 MCG tablet Take 175 mcg by mouth daily before breakfast.    . losartan (COZAAR) 100 MG tablet Take 100 mg by mouth daily.    . meloxicam (MOBIC) 15 MG tablet Take 1 tablet (15 mg total) by mouth daily. 30 tablet 3   No current facility-administered medications for this visit.    OBJECTIVE: There were no vitals filed for this visit.   There is no height or weight on file to calculate BMI.    ECOG FS:0 - Asymptomatic  General: Well-developed, well-nourished, no acute distress. Eyes: Pink conjunctiva, anicteric sclera. HEENT: Normocephalic, moist mucous membranes. Breast: Patient declined exam today. Lungs: Clear to auscultation bilaterally. Heart: Regular rate and rhythm. No rubs, murmurs, or gallops. Abdomen: Soft, nontender, nondistended. No organomegaly noted, normoactive bowel sounds. Musculoskeletal: No edema, cyanosis, or clubbing. Neuro: Alert, answering all questions appropriately. Cranial nerves grossly intact. Skin: No rashes or petechiae noted. Psych: Normal affect.  LAB RESULTS:  Lab Results  Component Value Date   NA 138 11/10/2019   K 4.0 11/10/2019   CL 105 11/10/2019   CO2 27 11/10/2019   GLUCOSE 104 (H) 11/10/2019   BUN 21 11/10/2019   CREATININE 0.47 11/10/2019   CALCIUM 9.2 11/10/2019   PROT 5.5 (L) 03/12/2015   ALBUMIN 2.6 (L) 03/12/2015   AST 19 03/12/2015   ALT 15 03/12/2015   ALKPHOS 59 03/12/2015   BILITOT 0.7 03/12/2015   GFRNONAA >60 11/10/2019   GFRAA >60 11/10/2019    Lab Results  Component  Value Date   WBC 6.8 11/10/2019   NEUTROABS 4.4 11/10/2019   HGB 12.4 11/10/2019   HCT 38.1 11/10/2019   MCV 89.9 11/10/2019   PLT 198 11/10/2019     STUDIES: No results found.  ASSESSMENT: Stage Ia adenocarcinoma of the upper outer quadrant of the left breast, stage IIa adenocarcinoma of the sigmoid colon with positive margins.  PLAN:    1.  Stage Ia adenocarcinoma of the upper outer quadrant of the left breast: No evidence of disease.  CA 27-29 continues to be within normal limits at 16.7.  Patient completed 5 years of letrozole in August 2020. Her most recent mammogram on June 01, 2018 was reported as BI-RADS 1 repeat in the next 1 to 2 weeks.  Return to clinic in 1 year for routine evaluation. 2.  Stage IIa adenocarcinoma of the sigmoid colon with positive margins:  Although patient's stage IIa, she was noted to have positive margins on her colectomy specimen on December 09, 2012.  She declined reexcision at that time.  Because of this,  she received 12 cycles of adjuvant FOLFOX completing in approximately July 2014. CT scan results from May 01, 2017 reviewed independently with no obvious evidence of recurrence or metastatic disease.  CEA continues to be within normal limits at 0.9.  Her most recent colonoscopy in December 2017 removed 2 polyps, but otherwise was within normal limits.  Have referred patient to GI for repeat colonoscopy.  Return to clinic in 1 year as above. 3.  Bone health: Bone mineral density on Apr 26, 2018 reported T score of -0.7 which is considered normal.  Continue monitoring per primary care.  I spent a total of 15 minutes face-to-face with the patient of which greater than 50% of the visit was spent in counseling and coordination of care as detailed above.  Patient expressed understanding and was in agreement with this plan. She also understands that She can call clinic at any time with any questions, concerns, or complaints.    Lloyd Huger, MD    12/01/2020 8:47 AM

## 2020-12-04 ENCOUNTER — Other Ambulatory Visit: Payer: Self-pay | Admitting: *Deleted

## 2020-12-04 DIAGNOSIS — C50412 Malignant neoplasm of upper-outer quadrant of left female breast: Secondary | ICD-10-CM

## 2020-12-04 DIAGNOSIS — C187 Malignant neoplasm of sigmoid colon: Secondary | ICD-10-CM

## 2020-12-05 ENCOUNTER — Inpatient Hospital Stay: Payer: 59 | Admitting: Oncology

## 2020-12-05 ENCOUNTER — Inpatient Hospital Stay: Payer: 59

## 2020-12-23 NOTE — Progress Notes (Deleted)
[facility]  Telephone:(336) [phone] [phone]) [phone]  ID: Jamie Martinez OB: 09/29/1956  MR#: [id number]  ELF#:[id number]  Patient Care Team: Jerl Mina, MD as PCP - General (Family Medicine)  CHIEF COMPLAINT: Stage Ia adenocarcinoma of the upper outer quadrant of the left breast, stage IIa adenocarcinoma of the sigmoid colon with positive margins.  INTERVAL HISTORY: Patient returns to clinic today for laboratory work and routine 48-month evaluation.  She continues to feel well and remains asymptomatic.  She has no neurologic complaints.  She denies any recent fevers or illnesses.  She has no chest pain, shortness of breath, cough, or hemoptysis.  She has a good appetite and denies weight loss.  She denies any nausea, vomiting, constipation, or diarrhea.  She has noted no changes in her bowel movements.  She denies any melena or hematochezia.  She has no urinary complaints.  Patient feels at her baseline offers no specific complaints today.  REVIEW OF SYSTEMS:   Review of Systems  Constitutional: Negative.  Negative for fever, malaise/fatigue and weight loss.  Respiratory: Negative.  Negative for cough and shortness of breath.   Cardiovascular: Negative.  Negative for chest pain and leg swelling.  Gastrointestinal: Negative.  Negative for abdominal pain, blood in stool, constipation, diarrhea, melena, nausea and vomiting.  Genitourinary: Negative.  Negative for dysuria.  Musculoskeletal: Negative.  Negative for back pain.  Skin: Negative.  Negative for rash.  Neurological: Negative.  Negative for sensory change, focal weakness, weakness and headaches.  Psychiatric/Behavioral: Negative.  The patient is not nervous/anxious.     As per HPI. Otherwise, a complete review of systems is negative.  PAST MEDICAL HISTORY: Past Medical History:  Diagnosis Date  . Arthritis   . Breast cancer ([facility]) 10/2009   positive/ radiation  . Breast cancer, left ([facility]) 2010   left  breast surgery and radiation  . Colon cancer ([facility]) 2013   colon resection   . DVT of leg (deep venous thrombosis) ([facility]) 02-2015   Left leg  . Headache    sinus  . Hypothyroidism   . Personal history of chemotherapy    Colon  . Personal history of radiation therapy 2010   Breast  . Wears dentures    full upper    PAST SURGICAL HISTORY: Past Surgical History:  Procedure Laterality Date  . ABDOMINAL HYSTERECTOMY     partial  . APPENDECTOMY  dec 2013  . BREAST BIOPSY Left 2010   +  . BREAST SURGERY Left 2010   lumpectomy done, lymph nodes removed   . COLON SURGERY  dec 2013   surgery for colon cancer, part of bladder, part of pelvic wall removed appendix removed 1 ovary removed   . COLONOSCOPY WITH PROPOFOL N/A 11/21/2016   Procedure: COLONOSCOPY WITH PROPOFOL;  Surgeon: Midge Minium, MD;  [facility]: [facility];  Service: Endoscopy;  Laterality: N/A;  . COLONOSCOPY WITH PROPOFOL N/A 11/28/2016   Procedure: COLONOSCOPY WITH PROPOFOL;  Surgeon: Midge Minium, MD;  [facility];  Service: Endoscopy;  Laterality: N/A;  . knee arthroscopy Bilateral   . KNEE CLOSED REDUCTION Bilateral 04/23/2015   Procedure: BILATERAL KNEE CLOSED MANIPULATION;  Surgeon: Ollen Gross, MD;  Location: [facility];  Service: Orthopedics;  Laterality: Bilateral;  . POLYPECTOMY  11/28/2016   Procedure: POLYPECTOMY;  Surgeon: Midge Minium, MD;  Location: [facility];  Service: Endoscopy;;  . TOTAL KNEE ARTHROPLASTY Bilateral 03/07/2015   Procedure: BILATERAL TOTAL KNEE ARTHROPLASTY;  Surgeon: Ollen Gross, MD;  Location:  [facility];  Service: Orthopedics;  Laterality: Bilateral;  +epidural    FAMILY HISTORY:  Reviewed and unchanged. No reported history of malignancy or chronic disease.     ADVANCED DIRECTIVES:    HEALTH MAINTENANCE: Social History   Tobacco Use  . Smoking status: Former Smoker    Quit date: 05/24/2012    Years since quitting: 8.5  . Smokeless tobacco: Never Used   Substance Use Topics  . Alcohol use: No  . Drug use: No     Allergies  Allergen Reactions  . Tramadol Nausea Only and Nausea And Vomiting    Current Outpatient Medications  Medication Sig Dispense Refill  . acetaminophen (TYLENOL) 500 MG tablet Take 500 mg by mouth every 6 (six) hours as needed (Pain).    Marland Kitchen letrozole (FEMARA) 2.5 MG tablet Take 1 tablet (2.5 mg total) by mouth daily. 90 tablet 1  . levothyroxine (SYNTHROID, LEVOTHROID) 175 MCG tablet Take 175 mcg by mouth daily before breakfast.    . losartan (COZAAR) 100 MG tablet Take 100 mg by mouth daily.    . meloxicam (MOBIC) 15 MG tablet Take 1 tablet (15 mg total) by mouth daily. 30 tablet 3   No current facility-administered medications for this visit.    OBJECTIVE: There were no vitals filed for this visit.   There is no height or weight on file to calculate BMI.    ECOG FS:0 - Asymptomatic  General: Well-developed, well-nourished, no acute distress. Eyes: Pink conjunctiva, anicteric sclera. HEENT: Normocephalic, moist mucous membranes. Breast: Patient declined exam today. Lungs: Clear to auscultation bilaterally. Heart: Regular rate and rhythm. No rubs, murmurs, or gallops. Abdomen: Soft, nontender, nondistended. No organomegaly noted, normoactive bowel sounds. Musculoskeletal: No edema, cyanosis, or clubbing. Neuro: Alert, answering all questions appropriately. Cranial nerves grossly intact. Skin: No rashes or petechiae noted. Psych: Normal affect.  LAB RESULTS:  Lab Results  Component Value Date   NA 138 11/10/2019   K 4.0 11/10/2019   CL 105 11/10/2019   CO2 27 11/10/2019   GLUCOSE 104 (H) 11/10/2019   BUN 21 11/10/2019   CREATININE 0.47 11/10/2019   CALCIUM 9.2 11/10/2019   PROT 5.5 (L) 03/12/2015   ALBUMIN 2.6 (L) 03/12/2015   AST 19 03/12/2015   ALT 15 03/12/2015   ALKPHOS 59 03/12/2015   BILITOT 0.7 03/12/2015   GFRNONAA >60 11/10/2019   GFRAA >60 11/10/2019    Lab Results  Component  Value Date   WBC 6.8 11/10/2019   NEUTROABS 4.4 11/10/2019   HGB 12.4 11/10/2019   HCT 38.1 11/10/2019   MCV 89.9 11/10/2019   PLT 198 11/10/2019     STUDIES: No results found.  ASSESSMENT: Stage Ia adenocarcinoma of the upper outer quadrant of the left breast, stage IIa adenocarcinoma of the sigmoid colon with positive margins.  PLAN:    1.  Stage Ia adenocarcinoma of the upper outer quadrant of the left breast: No evidence of disease.  CA 27-29 continues to be within normal limits at 16.7.  Patient completed 5 years of letrozole in August 2020. Her most recent mammogram on June 01, 2018 was reported as BI-RADS 1 repeat in the next 1 to 2 weeks.  Return to clinic in 1 year for routine evaluation. 2.  Stage IIa adenocarcinoma of the sigmoid colon with positive margins:  Although patient's stage IIa, she was noted to have positive margins on her colectomy specimen on December 09, 2012.  She declined reexcision at that time.  Because of this,  she received 12 cycles of adjuvant FOLFOX completing in approximately July 2014. CT scan results from May 01, 2017 reviewed independently with no obvious evidence of recurrence or metastatic disease.  CEA continues to be within normal limits at 0.9.  Her most recent colonoscopy in December 2017 removed 2 polyps, but otherwise was within normal limits.  Have referred patient to GI for repeat colonoscopy.  Return to clinic in 1 year as above. 3.  Bone health: Bone mineral density on Apr 26, 2018 reported T score of -0.7 which is considered normal.  Continue monitoring per primary care.  I spent a total of 15 minutes face-to-face with the patient of which greater than 50% of the visit was spent in counseling and coordination of care as detailed above.  Patient expressed understanding and was in agreement with this plan. She also understands that She can call clinic at any time with any questions, concerns, or complaints.    Lloyd Huger, MD   12/23/2020  9:41 AM

## 2020-12-26 ENCOUNTER — Inpatient Hospital Stay: Payer: 59

## 2020-12-26 ENCOUNTER — Inpatient Hospital Stay: Payer: 59 | Admitting: Oncology

## 2020-12-26 DIAGNOSIS — C50412 Malignant neoplasm of upper-outer quadrant of left female breast: Secondary | ICD-10-CM

## 2020-12-26 DIAGNOSIS — C187 Malignant neoplasm of sigmoid colon: Secondary | ICD-10-CM

## 2022-01-10 ENCOUNTER — Telehealth: Payer: Self-pay

## 2022-01-10 NOTE — Telephone Encounter (Signed)
CALLED PATIENT NO ANSWER LEFT VOICEMAIL FOR A CALL BACK Returning patients call to schedule a colonoscopy

## 2022-01-13 ENCOUNTER — Other Ambulatory Visit: Payer: Self-pay

## 2022-01-13 DIAGNOSIS — R7989 Other specified abnormal findings of blood chemistry: Secondary | ICD-10-CM

## 2022-01-13 DIAGNOSIS — Z1211 Encounter for screening for malignant neoplasm of colon: Secondary | ICD-10-CM

## 2022-01-13 MED ORDER — PEG 3350-KCL-NA BICARB-NACL 420 G PO SOLR: 4000.0000 mL | Freq: Once | ORAL | 0 refills | Status: AC

## 2022-01-13 NOTE — Progress Notes (Signed)
Gastroenterology Pre-Procedure Review  Request Date: 01/31/2022 Requesting Physician: Dr. Allen Norris  PATIENT REVIEW QUESTIONS: The patient responded to the following health history questions as indicated:    1. Are you having any GI issues? no 2. Do you have a personal history of Polyps? yes (had colon cancer) 3. Do you have a family history of Colon Cancer or Polyps? no 4. Diabetes Mellitus? no 5. Joint replacements in the past 12 months?no 6. Major health problems in the past 3 months?no 7. Any artificial heart valves, MVP, or defibrillator?no    MEDICATIONS & ALLERGIES:    Patient reports the following regarding taking any anticoagulation/antiplatelet therapy:   Plavix, Coumadin, Eliquis, Xarelto, Lovenox, Pradaxa, Brilinta, or Effient? no Aspirin? no  Patient confirms/reports the following medications:  Current Outpatient Medications  Medication Sig Dispense Refill   acetaminophen (TYLENOL) 500 MG tablet Take 500 mg by mouth every 6 (six) hours as needed (Pain).     letrozole (FEMARA) 2.5 MG tablet Take 1 tablet (2.5 mg total) by mouth daily. 90 tablet 1   levothyroxine (SYNTHROID, LEVOTHROID) 175 MCG tablet Take 175 mcg by mouth daily before breakfast.     losartan (COZAAR) 100 MG tablet Take 100 mg by mouth daily.     meloxicam (MOBIC) 15 MG tablet Take 1 tablet (15 mg total) by mouth daily. 30 tablet 3   No current facility-administered medications for this visit.    Patient confirms/reports the following allergies:  Allergies  Allergen Reactions   Tramadol Nausea Only and Nausea And Vomiting    No orders of the defined types were placed in this encounter.   AUTHORIZATION INFORMATION Primary Insurance: 1D#: Group #:  Secondary Insurance: 1D#: Group #:  SCHEDULE INFORMATION: Date: 01/31/2022 Time: Location: [facility]

## 2022-01-21 ENCOUNTER — Encounter: Payer: Self-pay | Admitting: Gastroenterology

## 2022-01-31 ENCOUNTER — Ambulatory Visit
Admission: RE | Admit: 2022-01-31 | Discharge: 2022-01-31 | Disposition: A | Discharge status: Home / Self Care | Payer: BC Managed Care – PPO | Attending: Gastroenterology | Admitting: Gastroenterology

## 2022-01-31 ENCOUNTER — Encounter: Payer: Self-pay | Admitting: Gastroenterology

## 2022-01-31 ENCOUNTER — Encounter
Admission: RE | Disposition: A | Discharge status: Home / Self Care | Payer: Self-pay | Source: Home / Self Care | Attending: Gastroenterology

## 2022-01-31 ENCOUNTER — Ambulatory Visit: Payer: BC Managed Care – PPO | Admitting: Anesthesiology

## 2022-01-31 ENCOUNTER — Other Ambulatory Visit: Payer: Self-pay

## 2022-01-31 DIAGNOSIS — Z85038 Personal history of other malignant neoplasm of large intestine: Secondary | ICD-10-CM | POA: Diagnosis not present

## 2022-01-31 DIAGNOSIS — Z6830 Body mass index (BMI) 30.0-30.9, adult: Secondary | ICD-10-CM | POA: Insufficient documentation

## 2022-01-31 DIAGNOSIS — K648 Other hemorrhoids: Secondary | ICD-10-CM | POA: Insufficient documentation

## 2022-01-31 DIAGNOSIS — Z87891 Personal history of nicotine dependence: Secondary | ICD-10-CM | POA: Insufficient documentation

## 2022-01-31 DIAGNOSIS — E669 Obesity, unspecified: Secondary | ICD-10-CM | POA: Diagnosis not present

## 2022-01-31 DIAGNOSIS — R7989 Other specified abnormal findings of blood chemistry: Secondary | ICD-10-CM

## 2022-01-31 DIAGNOSIS — K219 Gastro-esophageal reflux disease without esophagitis: Secondary | ICD-10-CM | POA: Diagnosis not present

## 2022-01-31 DIAGNOSIS — Z1211 Encounter for screening for malignant neoplasm of colon: Secondary | ICD-10-CM | POA: Diagnosis present

## 2022-01-31 DIAGNOSIS — Z86718 Personal history of other venous thrombosis and embolism: Secondary | ICD-10-CM | POA: Insufficient documentation

## 2022-01-31 DIAGNOSIS — E039 Hypothyroidism, unspecified: Secondary | ICD-10-CM | POA: Insufficient documentation

## 2022-01-31 DIAGNOSIS — Z853 Personal history of malignant neoplasm of breast: Secondary | ICD-10-CM | POA: Insufficient documentation

## 2022-01-31 HISTORY — PX: COLONOSCOPY WITH PROPOFOL: SHX5780

## 2022-01-31 HISTORY — DX: Gastro-esophageal reflux disease without esophagitis: K21.9

## 2022-01-31 SURGERY — COLONOSCOPY WITH PROPOFOL
Anesthesia: General | Site: Rectum

## 2022-01-31 MED ORDER — LACTATED RINGERS IV SOLN: INTRAVENOUS | Status: DC

## 2022-01-31 MED ORDER — ONDANSETRON HCL 4 MG/2ML IJ SOLN: 4.0000 mg | Freq: Once | INTRAMUSCULAR | Status: DC | PRN

## 2022-01-31 MED ORDER — STERILE WATER FOR IRRIGATION IR SOLN
Status: DC | PRN
  Administered 2022-01-31: 1

## 2022-01-31 MED ORDER — LIDOCAINE HCL (CARDIAC) PF 100 MG/5ML IV SOSY
PREFILLED_SYRINGE | INTRAVENOUS | Status: DC | PRN
Start: 2022-01-31 — End: 2022-01-31
  Administered 2022-01-31: 30 mg via INTRAVENOUS

## 2022-01-31 MED ORDER — PROPOFOL 10 MG/ML IV BOLUS
INTRAVENOUS | Status: DC | PRN
  Administered 2022-01-31: 30 mg via INTRAVENOUS
  Administered 2022-01-31: 20 mg via INTRAVENOUS
  Administered 2022-01-31: 30 mg via INTRAVENOUS
  Administered 2022-01-31: 50 mg via INTRAVENOUS
  Administered 2022-01-31: 120 mg via INTRAVENOUS
  Administered 2022-01-31 (×2): 30 mg via INTRAVENOUS

## 2022-01-31 MED ORDER — ACETAMINOPHEN 10 MG/ML IV SOLN: 1000.0000 mg | Freq: Once | INTRAVENOUS | Status: DC | PRN

## 2022-01-31 MED ORDER — SODIUM CHLORIDE 0.9 % IV SOLN: INTRAVENOUS | Status: DC

## 2022-01-31 SURGICAL SUPPLY — 7 items
GOWN CVR UNV OPN BCK APRN NK (MISCELLANEOUS) ×2 IMPLANT
GOWN ISOL THUMB LOOP REG UNIV (MISCELLANEOUS) ×4
KIT DEFENDO VALVE AND CONN (KITS) ×1 IMPLANT
KIT PRC NS LF DISP ENDO (KITS) ×1 IMPLANT
KIT PROCEDURE OLYMPUS (KITS) ×2
MANIFOLD NEPTUNE II (INSTRUMENTS) ×2 IMPLANT
WATER STERILE IRR 250ML POUR (IV SOLUTION) ×2 IMPLANT

## 2022-01-31 NOTE — Anesthesia Procedure Notes (Signed)
Date/Time: 01/31/2022 8:28 AM Performed by: Cameron Ali, CRNA Pre-anesthesia Checklist: Patient identified, Emergency Drugs available, Suction available, Timeout performed and Patient being monitored Patient Re-evaluated:Patient Re-evaluated prior to induction Oxygen Delivery Method: Nasal cannula Placement Confirmation: positive ETCO2

## 2022-01-31 NOTE — Anesthesia Preprocedure Evaluation (Signed)
Anesthesia Evaluation  Patient identified by MRN, date of birth, ID band Patient awake    Reviewed: Allergy & Precautions, NPO status , Patient's Chart, lab work & pertinent test results, reviewed documented beta blocker date and time   History of Anesthesia Complications Negative for: history of anesthetic complications  Airway Mallampati: III  TM Distance: <3 FB Neck ROM: Limited    Dental  (+) Upper Dentures, Edentulous Lower   Pulmonary former smoker,    breath sounds clear to auscultation       Cardiovascular (-) angina(-) DOE  Rhythm:Regular Rate:Normal     Neuro/Psych  Headaches,    GI/Hepatic GERD  ,  Endo/Other  Hypothyroidism   Renal/GU      Musculoskeletal  (+) Arthritis , Osteoarthritis,    Abdominal (+) + obese (BMI 31),   Peds  Hematology  (+) Blood dyscrasia, anemia ,  H/o DVT   Anesthesia Other Findings L breast CA Colon CA  Reproductive/Obstetrics                             Anesthesia Physical Anesthesia Plan  ASA: 3  Anesthesia Plan: General   Post-op Pain Management:    Induction: Intravenous  PONV Risk Score and Plan: 3 and Propofol infusion, TIVA and Treatment may vary due to age or medical condition  Airway Management Planned: Natural Airway and Nasal Cannula  Additional Equipment:   Intra-op Plan:   Post-operative Plan:   Informed Consent: I have reviewed the patients History and Physical, chart, labs and discussed the procedure including the risks, benefits and alternatives for the proposed anesthesia with the patient or authorized representative who has indicated his/her understanding and acceptance.       Plan Discussed with: CRNA and Anesthesiologist  Anesthesia Plan Comments:         Anesthesia Quick Evaluation

## 2022-01-31 NOTE — Transfer of Care (Signed)
Immediate Anesthesia Transfer of Care Note  Patient: Jamie Martinez  Procedure(s) Performed: COLONOSCOPY WITH PROPOFOL (Rectum)  Patient Location: PACU  Anesthesia Type: General  Level of Consciousness: awake, alert  and patient cooperative  Airway and Oxygen Therapy: Patient Spontanous Breathing and Patient connected to supplemental oxygen  Post-op Assessment: Post-op Vital signs reviewed, Patient's Cardiovascular Status Stable, Respiratory Function Stable, Patent Airway and No signs of Nausea or vomiting  Post-op Vital Signs: Reviewed and stable  Complications: No notable events documented.

## 2022-01-31 NOTE — Anesthesia Postprocedure Evaluation (Signed)
Anesthesia Post Note  Patient: Jamie Martinez  Procedure(s) Performed: COLONOSCOPY WITH PROPOFOL (Rectum)     Patient location during evaluation: PACU Anesthesia Type: General Level of consciousness: awake and alert Pain management: pain level controlled Vital Signs Assessment: post-procedure vital signs reviewed and stable Respiratory status: spontaneous breathing, nonlabored ventilation, respiratory function stable and patient connected to nasal cannula oxygen Cardiovascular status: blood pressure returned to baseline and stable Postop Assessment: no apparent nausea or vomiting Anesthetic complications: no   No notable events documented.  Weldon Nouri A  Jennifr Gaeta

## 2022-01-31 NOTE — H&P (Signed)
Lucilla Lame, MD [address] [place], [place] [address] Phone: [phone] Fax : [phone]  Primary Care Physician:  Maryland Pink, MD Primary Gastroenterologist:  Dr. Allen Norris  Pre-Procedure History & Physical: HPI:  Jamie Martinez is a 66 y.o. female is here for a screening colonoscopy.   Past Medical History:  Diagnosis Date   Arthritis    Breast cancer ([place]) 10/2009   positive/ radiation   Breast cancer, left ([place]) 2010   left breast surgery and radiation   Colon cancer ([place]) 2013   colon resection    DVT of leg (deep venous thrombosis) ([place]) 02/2015   Left leg   GERD (gastroesophageal reflux disease)    Headache    sinus   Hypothyroidism    Personal history of chemotherapy    Colon   Personal history of radiation therapy 2010   Breast   Wears dentures    full upper    Past Surgical History:  Procedure Laterality Date   ABDOMINAL HYSTERECTOMY     partial   APPENDECTOMY  dec 2013   BREAST BIOPSY Left 2010   +   BREAST SURGERY Left 2010   lumpectomy done, lymph nodes removed    COLON SURGERY  dec 2013   surgery for colon cancer, part of bladder, part of pelvic wall removed appendix removed 1 ovary removed    COLONOSCOPY WITH PROPOFOL N/A 11/21/2016   Procedure: COLONOSCOPY WITH PROPOFOL;  Surgeon: Lucilla Lame, MD;  Location: [facility];  Service: Endoscopy;  Laterality: N/A;   COLONOSCOPY WITH PROPOFOL N/A 11/28/2016   Procedure: COLONOSCOPY WITH PROPOFOL;  Surgeon: Lucilla Lame, MD;  Location: [facility];  Service: Endoscopy;  Laterality: N/A;   knee arthroscopy Bilateral    KNEE CLOSED REDUCTION Bilateral 04/23/2015   Procedure: BILATERAL KNEE CLOSED MANIPULATION;  Surgeon: Gaynelle Arabian, MD;  Location: [facility];  Service: Orthopedics;  Laterality: Bilateral;   POLYPECTOMY  11/28/2016   Procedure: POLYPECTOMY;  Surgeon: Lucilla Lame, MD;  Location: [facility];  Service: Endoscopy;;   TOTAL KNEE ARTHROPLASTY Bilateral  03/07/2015   Procedure: BILATERAL TOTAL KNEE ARTHROPLASTY;  Surgeon: Gaynelle Arabian, MD;  Location: [facility];  Service: Orthopedics;  Laterality: Bilateral;  +epidural    Prior to Admission medications   Medication Sig Start Date End Date Taking? Authorizing Provider  acetaminophen (TYLENOL) 500 MG tablet Take 500 mg by mouth every 6 (six) hours as needed (Pain).   Yes [provider]  letrozole (FEMARA) 2.5 MG tablet Take 1 tablet (2.5 mg total) by mouth daily. 12/09/16  Yes Lloyd Huger, MD  levothyroxine (SYNTHROID, LEVOTHROID) 175 MCG tablet Take 175 mcg by mouth daily before breakfast.   Yes [provider]  losartan (COZAAR) 100 MG tablet Take 50 mg by mouth daily.   Yes [provider]  meloxicam (MOBIC) 15 MG tablet Take 1 tablet (15 mg total) by mouth daily. Patient not taking: Reported on 01/21/2022 05/06/17   Tyson Dense T, DPM    Allergies as of 01/13/2022 - Review Complete 01/13/2022  Allergen Reaction Noted   Tramadol Nausea Only and Nausea And Vomiting 09/06/2015    Family History  Problem Relation Age of Onset   Pancreatic cancer Mother 47   Lung cancer Father 6   Breast cancer Neg Hx     Social History   Socioeconomic History   Marital status: Married    Spouse name: Not on file   Number of children: Not on file  Years of education: Not on file   Highest education level: Not on file  Occupational History   Not on file  Tobacco Use   Smoking status: Former    Types: Cigarettes    Quit date: 05/24/2012    Years since quitting: 9.6   Smokeless tobacco: Never  Vaping Use   Vaping Use: Never used  Substance and Sexual Activity   Alcohol use: No   Drug use: No   Sexual activity: Not on file  Other Topics Concern   Not on file  Social History Narrative   Not on file   Social Determinants of Health   Financial Resource Strain: Not on file  Food Insecurity: Not on file  Transportation Needs: Not on file  Physical Activity:  Not on file  Stress: Not on file  Social Connections: Not on file  Intimate Partner Violence: Not on file    Review of Systems: See HPI, otherwise negative ROS  Physical Exam: BP (!) 142/83    Pulse 72    Temp 97.8 F (36.6 C) (Temporal)    Ht 5\' 9"  (1.753 m)    Wt 94.1 kg    SpO2 97%    BMI 30.63 kg/m  General:   Alert,  pleasant and cooperative in NAD Head:  Normocephalic and atraumatic. Neck:  Supple; no masses or thyromegaly. Lungs:  Clear throughout to auscultation.    Heart:  Regular rate and rhythm. Abdomen:  Soft, nontender and nondistended. Normal bowel sounds, without guarding, and without rebound.   Neurologic:  Alert and  oriented x4;  grossly normal neurologically.  Impression/Plan: Jamie Martinez is now here to undergo a screening colonoscopy.  Risks, benefits, and alternatives regarding colonoscopy have been reviewed with the patient.  Questions have been answered.  All parties agreeable.

## 2022-01-31 NOTE — Op Note (Signed)
[facility] Patient Name: Jamie Martinez Procedure Date: 01/31/2022 8:22 AM MRN: [id number] Account #: [id number] Date of Birth: 05/20/1956 Admit Type: Outpatient Age: 66 Room: [facility] OR ROOM 01 Gender: Female Note Status: Finalized Instrument Name: [id number] Procedure:             Colonoscopy Indications:           Screening for colorectal malignant neoplasm Providers:             Jamie Lame MD, MD Referring MD:          Jamie Martinez. Jamie Kos, MD (Referring MD) Medicines:             Propofol per Anesthesia Complications:         No immediate complications. Procedure:             Pre-Anesthesia Assessment:                        - Prior to the procedure, a History and Physical was                         performed, and patient medications and allergies were                         reviewed. The patient's tolerance of previous                         anesthesia was also reviewed. The risks and benefits                         of the procedure and the sedation options and risks                         were discussed with the patient. All questions were                         answered, and informed consent was obtained. Prior                         Anticoagulants: The patient has taken no previous                         anticoagulant or antiplatelet agents. ASA Grade                         Assessment: II - A patient with mild systemic disease.                         After reviewing the risks and benefits, the patient                         was deemed in satisfactory condition to undergo the                         procedure.                        After obtaining informed consent, the colonoscope was  passed under direct vision. Throughout the procedure,                         the patient's blood pressure, pulse, and oxygen                         saturations were monitored continuously. The                         Colonoscope  was introduced through the anus and                         advanced to the the cecum, identified by appendiceal                         orifice and ileocecal valve. The colonoscopy was                         performed without difficulty. The patient tolerated                         the procedure well. The quality of the bowel                         preparation was adequate to identify polyps. Findings:      The perianal and digital rectal examinations were normal.      Non-bleeding internal hemorrhoids were found during retroflexion. The       hemorrhoids were Grade I (internal hemorrhoids that do not prolapse). Impression:            - Non-bleeding internal hemorrhoids.                        - No specimens collected. Recommendation:        - Discharge patient to home.                        - Resume previous diet.                        - Continue present medications. Procedure Code(s):     --- Professional ---                        [phone], Colonoscopy, flexible; diagnostic, including                         collection of specimen(s) by brushing or washing, when                         performed (separate procedure) Diagnosis Code(s):     --- Professional ---                        Z12.11, Encounter for screening for malignant neoplasm                         of colon CPT copyright 2019 American Medical Association. All rights reserved. The codes documented in this report are preliminary and upon coder review may  be revised to meet current compliance requirements. Jamie Martinez [employer]  MD, MD 01/31/2022 8:45:38 AM This report has been signed electronically. Number of Addenda: 0 Note Initiated On: 01/31/2022 8:22 AM Scope Withdrawal Time: 0 hours 7 minutes 13 seconds  Total Procedure Duration: 0 hours 13 minutes 0 seconds  Estimated Blood Loss:  Estimated blood loss: none.      Jamie Martinez

## 2022-12-20 ENCOUNTER — Ambulatory Visit
Admission: EM | Admit: 2022-12-20 | Discharge: 2022-12-20 | Disposition: A | Discharge status: Home / Self Care | Payer: BC Managed Care – PPO | Attending: Emergency Medicine | Admitting: Emergency Medicine

## 2022-12-20 DIAGNOSIS — R051 Acute cough: Secondary | ICD-10-CM | POA: Insufficient documentation

## 2022-12-20 DIAGNOSIS — Z1152 Encounter for screening for COVID-19: Secondary | ICD-10-CM | POA: Insufficient documentation

## 2022-12-20 DIAGNOSIS — J069 Acute upper respiratory infection, unspecified: Secondary | ICD-10-CM | POA: Insufficient documentation

## 2022-12-20 DIAGNOSIS — R059 Cough, unspecified: Secondary | ICD-10-CM | POA: Insufficient documentation

## 2022-12-20 HISTORY — DX: Essential (primary) hypertension: I10

## 2022-12-20 MED ORDER — PREDNISONE 10 MG (21) PO TBPK: ORAL_TABLET | Freq: Every day | ORAL | 0 refills | Status: AC

## 2022-12-20 MED ORDER — BENZONATATE 100 MG PO CAPS: 100.0000 mg | ORAL_CAPSULE | Freq: Three times a day (TID) | ORAL | 0 refills | Status: AC | PRN

## 2022-12-20 NOTE — Discharge Instructions (Addendum)
Take the prednisone and Tessalon Perles as directed.    Your COVID test is pending.    Take Tylenol as needed for fever or discomfort.  Rest and keep yourself hydrated.    Follow-up with your primary care provider if your symptoms are not improving.

## 2022-12-20 NOTE — ED Triage Notes (Signed)
Patient to Urgent Care with complaints of head and nasal congestion with some drainage/ cough. Reports cough is wet sounding, produced some mucus yesterday.   Denies any known fevers.   Symptoms started three days ago.  Taking mucinex.

## 2022-12-20 NOTE — ED Provider Notes (Signed)
Jamie Martinez    CSN: [id number] Arrival date & time: 12/20/22  9211      History   Chief Complaint Chief Complaint  Patient presents with   Cough    HPI Jamie Martinez is a 66 y.o. female.  Patient presents with 3-day history of congestion and cough.  No fever, rash, sore throat, shortness of breath, chest pain, or other symptoms.  Treatment at home with Mucinex and Tylenol; last taken at 0300.  Her medical history includes hypertension, diabetes, hyperlipidemia, DVT, breast cancer, colon cancer.  The history is provided by the patient and medical records.    Past Medical History:  Diagnosis Date   Arthritis    Breast cancer ([place]) 10/2009   positive/ radiation   Breast cancer, left ([place]) 2010   left breast surgery and radiation   Colon cancer ([place]) 2013   colon resection    DVT of leg (deep venous thrombosis) ([place]) 02/2015   Left leg   GERD (gastroesophageal reflux disease)    Headache    sinus   Hypertension    Hypothyroidism    Personal history of chemotherapy    Colon   Personal history of radiation therapy 2010   Breast   Wears dentures    full upper    Patient Active Problem List   Diagnosis Date Noted   Cough 12/20/2022   Benign neoplasm of ascending colon    Benign neoplasm of descending colon    Rectal polyp    Personal history of colon cancer    Procedure and treatment not carried out for other reasons    Thyroid disease 10/20/2016   Primary cancer of upper outer quadrant of left female breast ([place]) 09/30/2016   Cancer of sigmoid colon metastatic to intra-abdominal lymph node ([place]) 09/30/2016   Arthrofibrosis of total knee arthroplasty ([place]) 04/22/2015   Status post total bilateral knee replacement 03/12/2015   Acute blood loss anemia 03/12/2015   Status post bilateral knee replacements 03/12/2015   Postoperative anemia due to acute blood loss 03/11/2015   OA (osteoarthritis) of knee 03/07/2015    Past Surgical History:  Procedure  Laterality Date   ABDOMINAL HYSTERECTOMY     partial   APPENDECTOMY  dec 2013   BREAST BIOPSY Left 2010   +   BREAST SURGERY Left 2010   lumpectomy done, lymph nodes removed    COLON SURGERY  dec 2013   surgery for colon cancer, part of bladder, part of pelvic wall removed appendix removed 1 ovary removed    COLONOSCOPY WITH PROPOFOL N/A 11/21/2016   Procedure: COLONOSCOPY WITH PROPOFOL;  Surgeon: Lucilla Lame, MD;  Location: [facility];  Service: Endoscopy;  Laterality: N/A;   COLONOSCOPY WITH PROPOFOL N/A 11/28/2016   Procedure: COLONOSCOPY WITH PROPOFOL;  Surgeon: Lucilla Lame, MD;  Location: [facility];  Service: Endoscopy;  Laterality: N/A;   COLONOSCOPY WITH PROPOFOL N/A 01/31/2022   Procedure: COLONOSCOPY WITH PROPOFOL;  Surgeon: Lucilla Lame, MD;  Location: [facility];  Service: Endoscopy;  Laterality: N/A;   knee arthroscopy Bilateral    KNEE CLOSED REDUCTION Bilateral 04/23/2015   Procedure: BILATERAL KNEE CLOSED MANIPULATION;  Surgeon: Gaynelle Arabian, MD;  Location[facility];  Service: Orthopedics;  Laterality: Bilateral;   POLYPECTOMY  11/28/2016   Procedure: POLYPECTOMY;  Surgeon: Lucilla Lame, MD;  Location: [facility];  Service: Endoscopy;;   TOTAL KNEE ARTHROPLASTY Bilateral 03/07/2015   Procedure: BILATERAL TOTAL KNEE ARTHROPLASTY;  Surgeon: Gaynelle Arabian, MD;  Location:  [facility];  Service: Orthopedics;  Laterality: Bilateral;  +epidural    OB History   No obstetric history on file.      Home Medications    Prior to Admission medications   Medication Sig Start Date End Date Taking? Authorizing Provider  benzonatate (TESSALON) 100 MG capsule Take 1 capsule (100 mg total) by mouth 3 (three) times daily as needed for cough. 12/20/22  Yes Sharion Balloon, NP  predniSONE (STERAPRED UNI-PAK 21 TAB) 10 MG (21) TBPK tablet Take by mouth daily. As directed 12/20/22  Yes Sharion Balloon, NP  rosuvastatin (CRESTOR) 10 MG tablet Take 1 tablet by mouth daily.  06/16/22 06/16/23 Yes [provider]  acetaminophen (TYLENOL) 500 MG tablet Take 500 mg by mouth every 6 (six) hours as needed (Pain).    [provider]  letrozole (FEMARA) 2.5 MG tablet Take 1 tablet (2.5 mg total) by mouth daily. 12/09/16   Lloyd Huger, MD  levothyroxine (SYNTHROID, LEVOTHROID) 175 MCG tablet Take 175 mcg by mouth daily before breakfast.    [provider]  losartan (COZAAR) 100 MG tablet Take 50 mg by mouth daily.    [provider]  meloxicam (MOBIC) 15 MG tablet Take 1 tablet (15 mg total) by mouth daily. Patient not taking: Reported on 01/21/2022 05/06/17   Garrel Ridgel, DPM    Family History Family History  Problem Relation Age of Onset   Pancreatic cancer Mother 93   Lung cancer Father 67   Breast cancer Neg Hx     Social History Social History   Tobacco Use   Smoking status: Former    Types: Cigarettes    Quit date: 05/24/2012    Years since quitting: 10.5   Smokeless tobacco: Never  Vaping Use   Vaping Use: Never used  Substance Use Topics   Alcohol use: No   Drug use: No     Allergies   Morphine and related and Tramadol   Review of Systems Review of Systems  Constitutional:  Negative for chills and fever.  HENT:  Positive for congestion. Negative for ear pain and sore throat.   Respiratory:  Positive for cough. Negative for shortness of breath.   Cardiovascular:  Negative for chest pain and palpitations.  Gastrointestinal:  Negative for diarrhea and vomiting.  Skin:  Negative for color change and rash.  All other systems reviewed and are negative.    Physical Exam Triage Vital Signs ED Triage Vitals  Enc Vitals Group     BP 12/20/22 0849 135/87     Pulse Rate 12/20/22 0849 85     Resp 12/20/22 0849 18     Temp 12/20/22 0849 98 F (36.7 C)     Temp src --      SpO2 12/20/22 0849 98 %     Weight 12/20/22 0847 199 lb (90.3 kg)     Height 12/20/22 0847 '5\' 9"'$  (1.753 m)     Head Circumference  --      Peak Flow --      Pain Score 12/20/22 0847 3     Pain Loc --      Pain Edu? --      Excl. in [place]? --    No data found.  Updated Vital Signs BP 135/87   Pulse 85   Temp 98 F (36.7 C)   Resp 18   Ht '5\' 9"'$  (1.753 m)   Wt 199 lb (90.3 kg)   SpO2 98%  BMI 29.39 kg/m   Visual Acuity Right Eye Distance:   Left Eye Distance:   Bilateral Distance:    Right Eye Near:   Left Eye Near:    Bilateral Near:     Physical Exam Vitals and nursing note reviewed.  Constitutional:      General: She is not in acute distress.    Appearance: Normal appearance. She is well-developed. She is not ill-appearing.  HENT:     Right Ear: Tympanic membrane normal.     Left Ear: Tympanic membrane normal.     Nose: Congestion and rhinorrhea present.     Mouth/Throat:     Mouth: Mucous membranes are moist.     Pharynx: Oropharynx is clear.  Cardiovascular:     Rate and Rhythm: Normal rate and regular rhythm.     Heart sounds: Normal heart sounds.  Pulmonary:     Effort: Pulmonary effort is normal. No respiratory distress.     Breath sounds: Normal breath sounds.  Musculoskeletal:     Cervical back: Neck supple.  Skin:    General: Skin is warm and dry.  Neurological:     Mental Status: She is alert.  Psychiatric:        Mood and Affect: Mood normal.        Behavior: Behavior normal.      UC Treatments / Results  Labs (all labs ordered are listed, but only abnormal results are displayed) Labs Reviewed  SARS CORONAVIRUS 2 (TAT 6-24 HRS)    EKG   Radiology No results found.  Procedures Procedures (including critical care time)  Medications Ordered in UC Medications - No data to display  Initial Impression / Assessment and Plan / UC Course  I have reviewed the triage vital signs and the nursing notes.  Pertinent labs & imaging results that were available during my care of the patient were reviewed by me and considered in my medical decision making (see chart for  details).    Viral URI, cough.  Symptoms x 3 days.  COVID pending.  If COVID positive, recommend treatment with Paxlovid; will need to hold statin; Last GFR 104 on 12/17/2022.  Treating today with prednisone taper and Tessalon Perles.  Discussed symptomatic treatment including Tylenol, rest, hydration.  Instructed patient to follow up with her PCP if symptoms are not improving.  ED precautions provided.  She agrees to plan of care.    Final Clinical Impressions(s) / UC Diagnoses   Final diagnoses:  Acute cough  Viral URI     Discharge Instructions      Take the prednisone and Tessalon Perles as directed.    Your COVID test is pending.    Take Tylenol as needed for fever or discomfort.  Rest and keep yourself hydrated.    Follow-up with your primary care provider if your symptoms are not improving.         ED Prescriptions     Medication Sig Dispense Auth. Provider   predniSONE (STERAPRED UNI-PAK 21 TAB) 10 MG (21) TBPK tablet Take by mouth daily. As directed 21 tablet Sharion Balloon, NP   benzonatate (TESSALON) 100 MG capsule Take 1 capsule (100 mg total) by mouth 3 (three) times daily as needed for cough. 21 capsule Sharion Balloon, NP      PDMP not reviewed this encounter.   Sharion Balloon, NP 12/20/22 [phone]

## 2022-12-21 LAB — SARS CORONAVIRUS 2 (TAT 6-24 HRS): SARS Coronavirus 2: NEGATIVE

## 2024-01-15 ENCOUNTER — Other Ambulatory Visit: Payer: Self-pay | Admitting: Family Medicine

## 2024-01-15 DIAGNOSIS — Z1231 Encounter for screening mammogram for malignant neoplasm of breast: Secondary | ICD-10-CM

## 2024-01-20 ENCOUNTER — Ambulatory Visit
Admission: RE | Admit: 2024-01-20 | Discharge: 2024-01-20 | Disposition: A | Discharge status: Home / Self Care | Payer: PPO | Source: Ambulatory Visit | Attending: Family Medicine | Admitting: Family Medicine

## 2024-01-20 DIAGNOSIS — Z1231 Encounter for screening mammogram for malignant neoplasm of breast: Secondary | ICD-10-CM | POA: Diagnosis not present

## 2024-01-26 ENCOUNTER — Other Ambulatory Visit: Payer: Self-pay | Admitting: Family Medicine

## 2024-01-26 DIAGNOSIS — R928 Other abnormal and inconclusive findings on diagnostic imaging of breast: Secondary | ICD-10-CM

## 2024-01-27 DIAGNOSIS — E079 Disorder of thyroid, unspecified: Secondary | ICD-10-CM | POA: Diagnosis not present

## 2024-01-27 DIAGNOSIS — I10 Essential (primary) hypertension: Secondary | ICD-10-CM | POA: Diagnosis not present

## 2024-01-27 DIAGNOSIS — E785 Hyperlipidemia, unspecified: Secondary | ICD-10-CM | POA: Diagnosis not present

## 2024-02-02 ENCOUNTER — Ambulatory Visit
Admission: RE | Admit: 2024-02-02 | Discharge: 2024-02-02 | Disposition: A | Discharge status: Home / Self Care | Payer: PPO | Source: Ambulatory Visit | Attending: Family Medicine | Admitting: Family Medicine

## 2024-02-02 DIAGNOSIS — R928 Other abnormal and inconclusive findings on diagnostic imaging of breast: Secondary | ICD-10-CM | POA: Insufficient documentation

## 2024-02-02 DIAGNOSIS — C50911 Malignant neoplasm of unspecified site of right female breast: Secondary | ICD-10-CM | POA: Diagnosis not present

## 2024-02-02 DIAGNOSIS — R92333 Mammographic heterogeneous density, bilateral breasts: Secondary | ICD-10-CM | POA: Diagnosis not present

## 2024-02-03 DIAGNOSIS — C187 Malignant neoplasm of sigmoid colon: Secondary | ICD-10-CM | POA: Diagnosis not present

## 2024-02-03 DIAGNOSIS — C772 Secondary and unspecified malignant neoplasm of intra-abdominal lymph nodes: Secondary | ICD-10-CM | POA: Diagnosis not present

## 2024-02-03 DIAGNOSIS — E079 Disorder of thyroid, unspecified: Secondary | ICD-10-CM | POA: Diagnosis not present

## 2024-02-03 DIAGNOSIS — I10 Essential (primary) hypertension: Secondary | ICD-10-CM | POA: Diagnosis not present

## 2024-02-03 DIAGNOSIS — E785 Hyperlipidemia, unspecified: Secondary | ICD-10-CM | POA: Diagnosis not present

## 2024-02-03 DIAGNOSIS — E119 Type 2 diabetes mellitus without complications: Secondary | ICD-10-CM | POA: Diagnosis not present

## 2024-02-03 DIAGNOSIS — Z0000 Encounter for general adult medical examination without abnormal findings: Secondary | ICD-10-CM | POA: Diagnosis not present

## 2024-02-04 ENCOUNTER — Other Ambulatory Visit: Payer: Self-pay | Admitting: Family Medicine

## 2024-02-04 DIAGNOSIS — N63 Unspecified lump in unspecified breast: Secondary | ICD-10-CM

## 2024-02-04 DIAGNOSIS — R928 Other abnormal and inconclusive findings on diagnostic imaging of breast: Secondary | ICD-10-CM

## 2024-02-16 ENCOUNTER — Ambulatory Visit
Admission: RE | Admit: 2024-02-16 | Discharge: 2024-02-16 | Disposition: A | Discharge status: Home / Self Care | Payer: PPO | Source: Ambulatory Visit | Attending: Family Medicine | Admitting: Family Medicine

## 2024-02-16 DIAGNOSIS — N6001 Solitary cyst of right breast: Secondary | ICD-10-CM | POA: Diagnosis not present

## 2024-02-16 DIAGNOSIS — R928 Other abnormal and inconclusive findings on diagnostic imaging of breast: Secondary | ICD-10-CM

## 2024-02-16 DIAGNOSIS — N6081 Other benign mammary dysplasias of right breast: Secondary | ICD-10-CM | POA: Diagnosis not present

## 2024-02-16 DIAGNOSIS — N6315 Unspecified lump in the right breast, overlapping quadrants: Secondary | ICD-10-CM | POA: Diagnosis not present

## 2024-02-16 DIAGNOSIS — N6011 Diffuse cystic mastopathy of right breast: Secondary | ICD-10-CM | POA: Diagnosis not present

## 2024-02-16 DIAGNOSIS — N63 Unspecified lump in unspecified breast: Secondary | ICD-10-CM | POA: Insufficient documentation

## 2024-02-16 DIAGNOSIS — R92333 Mammographic heterogeneous density, bilateral breasts: Secondary | ICD-10-CM | POA: Insufficient documentation

## 2024-02-16 HISTORY — PX: BREAST CYST ASPIRATION: SHX578

## 2024-02-16 HISTORY — PX: BREAST BIOPSY: SHX20

## 2024-02-16 MED ORDER — LIDOCAINE-EPINEPHRINE 1 %-1:100000 IJ SOLN
8.0000 mL | Freq: Once | INTRAMUSCULAR | Status: AC
Start: 2024-02-16 — End: 2024-02-16
  Administered 2024-02-16: 8 mL
  Filled 2024-02-16: qty 8

## 2024-02-16 MED ORDER — LIDOCAINE 1 % OPTIME INJ - NO CHARGE
5.0000 mL | Freq: Once | INTRAMUSCULAR | Status: AC
  Administered 2024-02-16: 5 mL
  Filled 2024-02-16: qty 6

## 2024-02-16 MED ORDER — LIDOCAINE HCL 1 % IJ SOLN
2.0000 mL | Freq: Once | INTRAMUSCULAR | Status: AC
  Administered 2024-02-16: 2 mL
  Filled 2024-02-16: qty 2

## 2024-02-17 LAB — SURGICAL PATHOLOGY

## 2024-05-03 DIAGNOSIS — E079 Disorder of thyroid, unspecified: Secondary | ICD-10-CM | POA: Diagnosis not present

## 2024-05-03 DIAGNOSIS — E785 Hyperlipidemia, unspecified: Secondary | ICD-10-CM | POA: Diagnosis not present

## 2024-06-28 DIAGNOSIS — E079 Disorder of thyroid, unspecified: Secondary | ICD-10-CM | POA: Diagnosis not present
